# Patient Record
Sex: Male | Born: 1960 | Race: White | Hispanic: No | Marital: Single | Smoking: Never smoker
Health system: Southern US, Community
[De-identification: ages and names within clinical notes are randomized; demographics above are authoritative.]

## PROBLEM LIST (undated history)

## (undated) DIAGNOSIS — E785 Hyperlipidemia, unspecified: Secondary | ICD-10-CM

## (undated) DIAGNOSIS — I10 Essential (primary) hypertension: Secondary | ICD-10-CM

## (undated) DIAGNOSIS — T8859XA Other complications of anesthesia, initial encounter: Secondary | ICD-10-CM

## (undated) DIAGNOSIS — G4730 Sleep apnea, unspecified: Secondary | ICD-10-CM

## (undated) DIAGNOSIS — M1990 Unspecified osteoarthritis, unspecified site: Secondary | ICD-10-CM

## (undated) DIAGNOSIS — K219 Gastro-esophageal reflux disease without esophagitis: Secondary | ICD-10-CM

## (undated) HISTORY — PX: EYE SURGERY: SHX253

## (undated) HISTORY — PX: KNEE ARTHROSCOPY: SUR90

## (undated) HISTORY — PX: ROTATOR CUFF REPAIR: SHX139

## (undated) HISTORY — PX: KNEE ARTHROSCOPY: SHX127

---

## 2012-09-13 DIAGNOSIS — M47812 Spondylosis without myelopathy or radiculopathy, cervical region: Principal | ICD-10-CM | POA: Diagnosis present

## 2012-09-13 NOTE — H&P (Signed)
History of Present Illness The patient is a 52 year old male who presents today for follow up of their neck. The patient is being followed for their left-sided neck pain. Symptoms reported today include: pain (left lateral cervical pain with radiation into the left upper ext. to the level of the hand at times. He also has headaches, the pain starts posteriorly radiating into the top of the head. ), weakness (left upper ext. ) and numbness (left upper ext. ). The patient feels that they are doing poorly. Current treatment includes: physical therapy, activity modification and pain medications. The following medication has been used for pain control: Tylenol. The patient presents today following ESI (performed on [id number] per Dr. Ethelene Hal ) and physical therapy. The patient has reported symptom improvement with: physical therapy (minimal relief at this point ) while they have not gotten any relief of their symptoms with: Cortisone injections.   Subjective Transcription  He returns today for follow up. Unfortunately, the injections have not provided any significant relief. New MRI, which was done on 06-29-12, clearly demonstrates C4-5 pathology with left C5 nerve root compression due to uncovertebral joint spur formation. At this point, at C5-6, there is a small right posterior lateral disc protrusion but, again, all his symptoms are left sided, not right sided. Although there are some minor degenerative changes at 5-6 and 6-7, the bulk of his problem, I think was coming from 4-5 which does account for the left lateral deltoid and shoulder pain that he's having.   Allergies No Known Drug Allergies.    Social History Tobacco use. Never smoker. never smoker   Medication History Fish Oil Specific dose unknown - Active.   Vitals 08/16/2012 10:25 AM Weight: 195 lb Height: 69 in Body Surface Area: 2.08 m Body Mass Index: 28.8 kg/m Pulse: 59 (Regular) BP: 158/92 (Sitting,  Left Arm, Standard)  Plans Transcription  At this point, after a lengthy discussion, he would like to proceed with surgery. We talked about the disc replacement as well as fusion. The risks include infection, bleeding, nerve damage, death, stroke, paralysis, failure to heal, need for further surgery, ongoing or worse pain, loss of bowel or bladder control, throat pain, swallowing difficulties, hoarseness in the voice, need for posterior surgery, inability due to technical reasons to place the pro disc and needing to bail out to a fusion. We've gone over the surgical procedure itself. This would be an anterior incision with traction of the esophagus and trachea, removal of the C5 disc, and either implanting the plate and cage or the total disc replacement. At this point he is leaning towards doing a pro disc C, but I did tell him that, if, for technical reasons, it is not functioning correctly, I would bail out to a fusion. His MRI shows only very minor facet arthrosis. It's not significant enough that I would consider it contraindication to placing a total disc. No change to his current work status. At this point we will start the process. If there are any changes or issues, we will make adjustments.  Medical clearance obtained from Dr Shelby Mattocks, M. D

## 2012-09-26 ENCOUNTER — Encounter (HOSPITAL_COMMUNITY): Payer: Self-pay | Admitting: Pharmacy Technician

## 2012-09-29 ENCOUNTER — Encounter (HOSPITAL_COMMUNITY)
Admission: RE | Admit: 2012-09-29 | Discharge: 2012-09-29 | Disposition: A | Discharge status: Home / Self Care | Payer: Worker's Compensation | Source: Ambulatory Visit | Attending: Orthopedic Surgery | Admitting: Orthopedic Surgery

## 2012-09-29 ENCOUNTER — Encounter (HOSPITAL_COMMUNITY)
Admission: RE | Admit: 2012-09-29 | Discharge: 2012-09-29 | Disposition: A | Discharge status: Home / Self Care | Payer: Worker's Compensation | Source: Ambulatory Visit | Attending: Anesthesiology | Admitting: Anesthesiology

## 2012-09-29 ENCOUNTER — Encounter (HOSPITAL_COMMUNITY): Payer: Self-pay

## 2012-09-29 HISTORY — DX: Essential (primary) hypertension: I10

## 2012-09-29 LAB — CBC
HCT: 43.3 % (ref 39.0–52.0)
Hemoglobin: 15.7 g/dL (ref 13.0–17.0)
WBC: 8 10*3/uL (ref 4.0–10.5)

## 2012-09-29 LAB — SURGICAL PCR SCREEN: Staphylococcus aureus: NEGATIVE

## 2012-09-29 NOTE — Pre-Procedure Instructions (Signed)
Erik Cook  09/29/2012   Your procedure is scheduled on:  Wednesday, March 5th   Report to [facility] at  6:30 AM.  Call this number if you have problems the morning of surgery: [phone]   Remember:   Do not eat food or drink liquids after midnight Tuesday.   Take these medicines the morning of surgery with A SIP OF WATER: none   Do not wear jewelry.  Do not wear lotions, powders, or colognes. You may NOT wear deodorant.    Men may shave face and neck.   Do not bring valuables to the hospital.  Contacts, dentures or bridgework may not be worn into surgery.   Leave suitcase in the car. After surgery it may be brought to your room.  For patients admitted to the hospital, checkout time is 11:00 AM the day of discharge.   Patients discharged the day of surgery will not be allowed to drive home.   Name and phone number of your driver:    Special Instructions: Shower using CHG 2 nights before surgery and the night before surgery.  If you shower the day of surgery use CHG.  Use special wash - you have one bottle of CHG for all showers.  You should use approximately 1/3 of the bottle for each shower.   Please read over the following fact sheets that you were given: Pain Booklet, Coughing and Deep Breathing, MRSA Information and Surgical Site Infection Prevention

## 2012-10-04 MED ORDER — CEFAZOLIN SODIUM-DEXTROSE 2-3 GM-% IV SOLR
2.0000 g | INTRAVENOUS | Status: AC
  Administered 2012-10-05: 2 g via INTRAVENOUS
  Filled 2012-10-04: qty 50

## 2012-10-05 ENCOUNTER — Encounter (HOSPITAL_COMMUNITY): Payer: Self-pay | Admitting: *Deleted

## 2012-10-05 ENCOUNTER — Ambulatory Visit (HOSPITAL_COMMUNITY): Payer: Worker's Compensation

## 2012-10-05 ENCOUNTER — Observation Stay (HOSPITAL_COMMUNITY): Payer: Worker's Compensation

## 2012-10-05 ENCOUNTER — Ambulatory Visit (HOSPITAL_COMMUNITY): Payer: Worker's Compensation | Admitting: Anesthesiology

## 2012-10-05 ENCOUNTER — Observation Stay (HOSPITAL_COMMUNITY)
Admission: RE | Admit: 2012-10-05 | Discharge: 2012-10-05 | Disposition: A | Discharge status: Home / Self Care | Payer: Worker's Compensation | Source: Ambulatory Visit | Attending: Orthopedic Surgery | Admitting: Orthopedic Surgery

## 2012-10-05 ENCOUNTER — Encounter (HOSPITAL_COMMUNITY): Payer: Self-pay | Admitting: Anesthesiology

## 2012-10-05 ENCOUNTER — Encounter (HOSPITAL_COMMUNITY)
Admission: RE | Disposition: A | Discharge status: Home / Self Care | Payer: Self-pay | Source: Ambulatory Visit | Attending: Orthopedic Surgery

## 2012-10-05 DIAGNOSIS — Z01818 Encounter for other preprocedural examination: Secondary | ICD-10-CM | POA: Insufficient documentation

## 2012-10-05 DIAGNOSIS — Z01812 Encounter for preprocedural laboratory examination: Secondary | ICD-10-CM | POA: Insufficient documentation

## 2012-10-05 DIAGNOSIS — M47812 Spondylosis without myelopathy or radiculopathy, cervical region: Principal | ICD-10-CM | POA: Diagnosis present

## 2012-10-05 DIAGNOSIS — I10 Essential (primary) hypertension: Secondary | ICD-10-CM | POA: Insufficient documentation

## 2012-10-05 HISTORY — PX: ANTERIOR CERVICAL DECOMP/DISCECTOMY FUSION: SHX1161

## 2012-10-05 SURGERY — ANTERIOR CERVICAL DECOMPRESSION/DISCECTOMY FUSION 1 LEVEL
Anesthesia: General | Site: Spine Cervical | Wound class: Clean

## 2012-10-05 MED ORDER — ONDANSETRON HCL 4 MG/2ML IJ SOLN: 4.0000 mg | INTRAMUSCULAR | Status: DC | PRN

## 2012-10-05 MED ORDER — MORPHINE SULFATE 2 MG/ML IJ SOLN: 1.0000 mg | INTRAMUSCULAR | Status: DC | PRN

## 2012-10-05 MED ORDER — OXYCODONE-ACETAMINOPHEN 10-325 MG PO TABS: 1.0000 | ORAL_TABLET | ORAL | Status: DC | PRN

## 2012-10-05 MED ORDER — POLYETHYLENE GLYCOL 3350 17 GM/SCOOP PO POWD: 17.0000 g | Freq: Every day | ORAL | Status: DC

## 2012-10-05 MED ORDER — EPHEDRINE SULFATE 50 MG/ML IJ SOLN
INTRAMUSCULAR | Status: DC | PRN
  Administered 2012-10-05: 5 mg via INTRAVENOUS

## 2012-10-05 MED ORDER — OXYCODONE HCL 5 MG PO TABS
10.0000 mg | ORAL_TABLET | ORAL | Status: DC | PRN
  Administered 2012-10-05: 10 mg via ORAL

## 2012-10-05 MED ORDER — MENTHOL 3 MG MT LOZG
1.0000 | LOZENGE | OROMUCOSAL | Status: DC | PRN
  Administered 2012-10-05: 3 mg via ORAL
  Filled 2012-10-05: qty 9

## 2012-10-05 MED ORDER — MEPERIDINE HCL 25 MG/ML IJ SOLN: 6.2500 mg | INTRAMUSCULAR | Status: DC | PRN

## 2012-10-05 MED ORDER — HYDROMORPHONE HCL PF 1 MG/ML IJ SOLN
0.2500 mg | INTRAMUSCULAR | Status: DC | PRN
  Administered 2012-10-05 (×4): 0.5 mg via INTRAVENOUS

## 2012-10-05 MED ORDER — PROPOFOL 10 MG/ML IV BOLUS
INTRAVENOUS | Status: DC | PRN
  Administered 2012-10-05: 120 mg via INTRAVENOUS

## 2012-10-05 MED ORDER — METHOCARBAMOL 100 MG/ML IJ SOLN: 500.0000 mg | Freq: Four times a day (QID) | INTRAVENOUS | Status: DC | PRN

## 2012-10-05 MED ORDER — ONDANSETRON HCL 4 MG/2ML IJ SOLN
4.0000 mg | Freq: Once | INTRAMUSCULAR | Status: AC | PRN
  Administered 2012-10-05: 4 mg via INTRAVENOUS

## 2012-10-05 MED ORDER — SODIUM CHLORIDE 0.9 % IV SOLN: 250.0000 mL | INTRAVENOUS | Status: DC

## 2012-10-05 MED ORDER — ARTIFICIAL TEARS OP OINT
TOPICAL_OINTMENT | OPHTHALMIC | Status: DC | PRN
  Administered 2012-10-05: 1 via OPHTHALMIC

## 2012-10-05 MED ORDER — PROMETHAZINE HCL 25 MG/ML IJ SOLN
6.2500 mg | Freq: Once | INTRAMUSCULAR | Status: AC
  Administered 2012-10-05: 6.25 mg via INTRAVENOUS

## 2012-10-05 MED ORDER — ACETAMINOPHEN 10 MG/ML IV SOLN
1000.0000 mg | Freq: Four times a day (QID) | INTRAVENOUS | Status: DC
  Filled 2012-10-05 (×3): qty 100

## 2012-10-05 MED ORDER — HYDROMORPHONE HCL PF 1 MG/ML IJ SOLN
INTRAMUSCULAR | Status: AC
  Filled 2012-10-05: qty 1

## 2012-10-05 MED ORDER — DEXAMETHASONE 4 MG PO TABS
4.0000 mg | ORAL_TABLET | Freq: Four times a day (QID) | ORAL | Status: DC
  Filled 2012-10-05 (×3): qty 1

## 2012-10-05 MED ORDER — FENTANYL CITRATE 0.05 MG/ML IJ SOLN
INTRAMUSCULAR | Status: DC | PRN
  Administered 2012-10-05 (×2): 50 ug via INTRAVENOUS
  Administered 2012-10-05: 100 ug via INTRAVENOUS

## 2012-10-05 MED ORDER — ACETAMINOPHEN 10 MG/ML IV SOLN
INTRAVENOUS | Status: AC
  Filled 2012-10-05: qty 100

## 2012-10-05 MED ORDER — OXYCODONE HCL 5 MG PO TABS
ORAL_TABLET | ORAL | Status: AC
  Filled 2012-10-05: qty 2

## 2012-10-05 MED ORDER — LIDOCAINE HCL (CARDIAC) 20 MG/ML IV SOLN
INTRAVENOUS | Status: DC | PRN
  Administered 2012-10-05: 100 mg via INTRAVENOUS

## 2012-10-05 MED ORDER — PROMETHAZINE HCL 25 MG/ML IJ SOLN
INTRAMUSCULAR | Status: AC
  Filled 2012-10-05: qty 1

## 2012-10-05 MED ORDER — DEXAMETHASONE SODIUM PHOSPHATE 4 MG/ML IJ SOLN
4.0000 mg | Freq: Four times a day (QID) | INTRAMUSCULAR | Status: DC
  Filled 2012-10-05 (×3): qty 1

## 2012-10-05 MED ORDER — ZOLPIDEM TARTRATE 5 MG PO TABS: 5.0000 mg | ORAL_TABLET | Freq: Every evening | ORAL | Status: DC | PRN

## 2012-10-05 MED ORDER — OXYCODONE HCL 5 MG PO TABS: 5.0000 mg | ORAL_TABLET | Freq: Once | ORAL | Status: DC | PRN

## 2012-10-05 MED ORDER — ONDANSETRON HCL 4 MG PO TABS: 4.0000 mg | ORAL_TABLET | Freq: Three times a day (TID) | ORAL | Status: DC | PRN

## 2012-10-05 MED ORDER — METHOCARBAMOL 500 MG PO TABS
ORAL_TABLET | ORAL | Status: AC
  Filled 2012-10-05: qty 1

## 2012-10-05 MED ORDER — DEXAMETHASONE SODIUM PHOSPHATE 10 MG/ML IJ SOLN: 4.0000 mg | Freq: Once | INTRAMUSCULAR | Status: DC

## 2012-10-05 MED ORDER — LACTATED RINGERS IV SOLN: INTRAVENOUS | Status: DC

## 2012-10-05 MED ORDER — BUPIVACAINE-EPINEPHRINE 0.25% -1:200000 IJ SOLN
INTRAMUSCULAR | Status: DC | PRN
  Administered 2012-10-05: 2 mL

## 2012-10-05 MED ORDER — METHOCARBAMOL 500 MG PO TABS: 500.0000 mg | ORAL_TABLET | Freq: Three times a day (TID) | ORAL | Status: DC | PRN

## 2012-10-05 MED ORDER — SODIUM CHLORIDE 0.9 % IJ SOLN: 3.0000 mL | Freq: Two times a day (BID) | INTRAMUSCULAR | Status: DC

## 2012-10-05 MED ORDER — DEXAMETHASONE SODIUM PHOSPHATE 10 MG/ML IJ SOLN
INTRAMUSCULAR | Status: AC
  Administered 2012-10-05: 10 mg via INTRAVENOUS
  Filled 2012-10-05: qty 1

## 2012-10-05 MED ORDER — NEOSTIGMINE METHYLSULFATE 1 MG/ML IJ SOLN
INTRAMUSCULAR | Status: DC | PRN
  Administered 2012-10-05: 3 mg via INTRAVENOUS

## 2012-10-05 MED ORDER — GLYCOPYRROLATE 0.2 MG/ML IJ SOLN
INTRAMUSCULAR | Status: DC | PRN
  Administered 2012-10-05: 0.4 mg via INTRAVENOUS

## 2012-10-05 MED ORDER — CEFAZOLIN SODIUM 1-5 GM-% IV SOLN
1.0000 g | Freq: Three times a day (TID) | INTRAVENOUS | Status: DC
  Administered 2012-10-05: 1 g via INTRAVENOUS
  Filled 2012-10-05 (×2): qty 50

## 2012-10-05 MED ORDER — METHOCARBAMOL 500 MG PO TABS
500.0000 mg | ORAL_TABLET | Freq: Four times a day (QID) | ORAL | Status: DC | PRN
  Administered 2012-10-05: 500 mg via ORAL

## 2012-10-05 MED ORDER — LIDOCAINE HCL 4 % MT SOLN
OROMUCOSAL | Status: DC | PRN
  Administered 2012-10-05: 4 mL via TOPICAL

## 2012-10-05 MED ORDER — ONDANSETRON HCL 4 MG/2ML IJ SOLN
INTRAMUSCULAR | Status: DC | PRN
  Administered 2012-10-05: 4 mg via INTRAVENOUS

## 2012-10-05 MED ORDER — DOCUSATE SODIUM 100 MG PO CAPS: 100.0000 mg | ORAL_CAPSULE | Freq: Three times a day (TID) | ORAL | Status: DC | PRN

## 2012-10-05 MED ORDER — 0.9 % SODIUM CHLORIDE (POUR BTL) OPTIME
TOPICAL | Status: DC | PRN
  Administered 2012-10-05: 1000 mL

## 2012-10-05 MED ORDER — THROMBIN 20000 UNITS EX SOLR
CUTANEOUS | Status: DC | PRN
  Administered 2012-10-05: 09:00:00 via TOPICAL

## 2012-10-05 MED ORDER — BUPIVACAINE-EPINEPHRINE 0.25% -1:200000 IJ SOLN
INTRAMUSCULAR | Status: AC
  Filled 2012-10-05: qty 1

## 2012-10-05 MED ORDER — OXYCODONE HCL 5 MG/5ML PO SOLN: 5.0000 mg | Freq: Once | ORAL | Status: DC | PRN

## 2012-10-05 MED ORDER — SODIUM CHLORIDE 0.9 % IJ SOLN: 3.0000 mL | INTRAMUSCULAR | Status: DC | PRN

## 2012-10-05 MED ORDER — HYDROCHLOROTHIAZIDE 25 MG PO TABS: 25.0000 mg | ORAL_TABLET | Freq: Every day | ORAL | Status: DC

## 2012-10-05 MED ORDER — THROMBIN 20000 UNITS EX SOLR
CUTANEOUS | Status: AC
  Filled 2012-10-05: qty 20000

## 2012-10-05 MED ORDER — ACETAMINOPHEN 10 MG/ML IV SOLN
1000.0000 mg | Freq: Once | INTRAVENOUS | Status: AC
  Administered 2012-10-05: 1000 mg via INTRAVENOUS
  Filled 2012-10-05: qty 100

## 2012-10-05 MED ORDER — MIDAZOLAM HCL 5 MG/5ML IJ SOLN
INTRAMUSCULAR | Status: DC | PRN
  Administered 2012-10-05: 2 mg via INTRAVENOUS

## 2012-10-05 MED ORDER — LACTATED RINGERS IV SOLN
INTRAVENOUS | Status: DC | PRN
  Administered 2012-10-05 (×2): via INTRAVENOUS

## 2012-10-05 MED ORDER — PHENOL 1.4 % MT LIQD
1.0000 | OROMUCOSAL | Status: DC | PRN
  Filled 2012-10-05: qty 177

## 2012-10-05 MED ORDER — ONDANSETRON HCL 4 MG/2ML IJ SOLN
INTRAMUSCULAR | Status: AC
  Filled 2012-10-05: qty 2

## 2012-10-05 MED ORDER — MENTHOL 3 MG MT LOZG: 1.0000 | LOZENGE | OROMUCOSAL | Status: DC | PRN

## 2012-10-05 MED ORDER — ROCURONIUM BROMIDE 100 MG/10ML IV SOLN
INTRAVENOUS | Status: DC | PRN
  Administered 2012-10-05: 50 mg via INTRAVENOUS

## 2012-10-05 SURGICAL SUPPLY — 52 items
BLADE SURG ROTATE 9660 (MISCELLANEOUS) IMPLANT
BUR EGG ELITE 4.0 (BURR) IMPLANT
BUR MATCHSTICK NEURO 3.0 LAGG (BURR) IMPLANT
CANISTER SUCTION 2500CC (MISCELLANEOUS) ×2 IMPLANT
CLOTH BEACON ORANGE TIMEOUT ST (SAFETY) ×2 IMPLANT
CLSR STERI-STRIP ANTIMIC 1/2X4 (GAUZE/BANDAGES/DRESSINGS) ×2 IMPLANT
COLLAR CERV LO CONTOUR FIRM DE (SOFTGOODS) ×2 IMPLANT
CORDS BIPOLAR (ELECTRODE) ×2 IMPLANT
COVER SURGICAL LIGHT HANDLE (MISCELLANEOUS) ×4 IMPLANT
CRADLE DONUT ADULT HEAD (MISCELLANEOUS) ×2 IMPLANT
DRAPE C-ARM 42X72 X-RAY (DRAPES) ×2 IMPLANT
DRAPE POUCH INSTRU U-SHP 10X18 (DRAPES) ×2 IMPLANT
DRAPE SURG 17X23 STRL (DRAPES) ×2 IMPLANT
DRAPE U-SHAPE 47X51 STRL (DRAPES) ×2 IMPLANT
DRSG MEPILEX BORDER 4X4 (GAUZE/BANDAGES/DRESSINGS) ×2 IMPLANT
DURAPREP 26ML APPLICATOR (WOUND CARE) ×2 IMPLANT
ELECT COATED BLADE 2.86 ST (ELECTRODE) ×2 IMPLANT
ELECT REM PT RETURN 9FT ADLT (ELECTROSURGICAL) ×2
ELECTRODE REM PT RTRN 9FT ADLT (ELECTROSURGICAL) ×1 IMPLANT
GLOVE BIOGEL PI IND STRL 8.5 (GLOVE) ×1 IMPLANT
GLOVE BIOGEL PI INDICATOR 8.5 (GLOVE) ×1
GLOVE ECLIPSE 8.5 STRL (GLOVE) ×2 IMPLANT
GOWN PREVENTION PLUS XXLARGE (GOWN DISPOSABLE) ×2 IMPLANT
GOWN STRL REIN XL XLG (GOWN DISPOSABLE) ×4 IMPLANT
KIT BASIN OR (CUSTOM PROCEDURE TRAY) ×2 IMPLANT
KIT ROOM TURNOVER OR (KITS) ×2 IMPLANT
NEEDLE SPNL 18GX3.5 QUINCKE PK (NEEDLE) ×2 IMPLANT
NS IRRIG 1000ML POUR BTL (IV SOLUTION) ×2 IMPLANT
PACK ORTHO CERVICAL (CUSTOM PROCEDURE TRAY) ×2 IMPLANT
PACK UNIVERSAL I (CUSTOM PROCEDURE TRAY) ×2 IMPLANT
PAD ARMBOARD 7.5X6 YLW CONV (MISCELLANEOUS) ×4 IMPLANT
PATTIES SURGICAL .25X.25 (GAUZE/BANDAGES/DRESSINGS) IMPLANT
PIN RETAINER PRODISC 14 MM (PIN) ×4 IMPLANT
PLATE ONE LEVEL SKYLINE 14MM (Plate) ×2 IMPLANT
PUTTY BONE DBX 2.5 MIS (Bone Implant) ×2 IMPLANT
RESTRAINT LIMB HOLDER UNIV (RESTRAINTS) ×2 IMPLANT
SCREW SKYLINE 14MM SD-VA (Screw) ×4 IMPLANT
SPONGE INTESTINAL PEANUT (DISPOSABLE) ×2 IMPLANT
SPONGE SURGIFOAM ABS GEL 100 (HEMOSTASIS) ×2 IMPLANT
SURGIFLO TRUKIT (HEMOSTASIS) IMPLANT
SUT MNCRL AB 3-0 PS2 18 (SUTURE) ×2 IMPLANT
SUT SILK 2 0 (SUTURE)
SUT SILK 2-0 18XBRD TIE 12 (SUTURE) IMPLANT
SUT VIC AB 2-0 CT1 18 (SUTURE) ×2 IMPLANT
SYR BULB IRRIGATION 50ML (SYRINGE) ×2 IMPLANT
SYR CONTROL 10ML LL (SYRINGE) ×2 IMPLANT
Skyline 16mm SD-VA screw (Spine Construct) ×4 IMPLANT
TAPE CLOTH 4X10 WHT NS (GAUZE/BANDAGES/DRESSINGS) ×2 IMPLANT
TAPE UMBILICAL COTTON 1/8X30 (MISCELLANEOUS) ×2 IMPLANT
TOWEL OR 17X24 6PK STRL BLUE (TOWEL DISPOSABLE) ×2 IMPLANT
TOWEL OR 17X26 10 PK STRL BLUE (TOWEL DISPOSABLE) ×2 IMPLANT
WATER STERILE IRR 1000ML POUR (IV SOLUTION) ×2 IMPLANT

## 2012-10-05 NOTE — Anesthesia Postprocedure Evaluation (Signed)
Anesthesia Post Note  Patient: Erik Cook  Procedure(s) Performed: Procedure(s) (LRB): ANTERIOR CERVICAL DECOMPRESSION/DISCECTOMY FUSION 1 LEVEL (N/A)  Anesthesia type: general  Patient location: PACU  Post pain: Pain level controlled  Post assessment: Patient's Cardiovascular Status Stable  Last Vitals:  Filed Vitals:   10/05/12 1200  BP: 152/76  Pulse: 52  Temp:   Resp: 13    Post vital signs: Reviewed and stable  Level of consciousness: sedated  Complications: No apparent anesthesia complications

## 2012-10-05 NOTE — Transfer of Care (Signed)
Immediate Anesthesia Transfer of Care Note  Patient: Erik Cook  Procedure(s) Performed: Procedure(s) with comments: ANTERIOR CERVICAL DECOMPRESSION/DISCECTOMY FUSION 1 LEVEL (N/A) - TOTAL DISC REPLACEMENT VERSES ACDF C4-5  Patient Location: PACU  Anesthesia Type:General  Level of Consciousness: oriented, sedated and patient cooperative  Airway & Oxygen Therapy: Patient Spontanous Breathing and Patient connected to nasal cannula oxygen  Post-op Assessment: Report given to PACU RN and Post -op Vital signs reviewed and stable  Post vital signs: Reviewed and stable  Complications: No apparent anesthesia complications

## 2012-10-05 NOTE — H&P (Signed)
No change in clinical exam H+P reviewed  

## 2012-10-05 NOTE — Preoperative (Signed)
Beta Blockers   Reason not to administer Beta Blockers:Not Applicable 

## 2012-10-05 NOTE — Brief Op Note (Signed)
10/05/2012  10:49 AM  PATIENT:  Bernerd Limbo  52 y.o. male  PRE-OPERATIVE DIAGNOSIS:  C4-5 DISCARNATION  POST-OPERATIVE DIAGNOSIS:  C4-5 DISCARNATION  PROCEDURE:  Procedure(s) with comments: ANTERIOR CERVICAL DECOMPRESSION/DISCECTOMY FUSION 1 LEVEL (N/A) - TOTAL DISC REPLACEMENT VERSES ACDF C4-5  SURGEON:  Surgeon(s) and Role:    * Venita Lick, MD - Primary  PHYSICIAN ASSISTANT:   ASSISTANTS: none   ANESTHESIA:   general  EBL:  Total I/O In: 1200 [I.V.:1200] Out: 30 [Blood:30]  BLOOD ADMINISTERED:none  DRAINS: none   LOCAL MEDICATIONS USED:  MARCAINE     SPECIMEN:  No Specimen  DISPOSITION OF SPECIMEN:  N/A  COUNTS:  YES  TOURNIQUET:  * No tourniquets in log *  DICTATION: .Other Dictation: Dictation Number [id number]  PLAN OF CARE: Admit for overnight observation  PATIENT DISPOSITION:  PACU - hemodynamically stable.

## 2012-10-05 NOTE — Op Note (Signed)
NAMEMarland Kitchen  ARYE, WEYENBERG NO.:  [id number]  MEDICAL RECORD NO.:  [id number]  LOCATION:  [id number]                        FACILITY:  [facility]  PHYSICIAN:  Alvy Beal, MD    DATE OF BIRTH:  Dec 07, 1960  DATE OF PROCEDURE:  10/05/2012 DATE OF DISCHARGE:  10/05/2012                              OPERATIVE REPORT   PREOPERATIVE DIAGNOSIS:  Cervical spondylitic radiculopathy, C4-5.  POSTOPERATIVE DIAGNOSIS:  Cervical spondylitic radiculopathy, C4-5.  OPERATION:  Intended operation was the cervical total disk replacement versus ACDF at C4-5.  Actual procedure was anterior cervical diskectomy and fusion at C4-5.  INTRAOPERATIVE FINDINGS:  Once the diskectomy was completed, there was significant downsloping of the superior endplate of C5, which precluded the implantation of the ProDisc.  As a result of the structural deformity in the vertebral body, I elected to proceed with the fusion.  COMPLICATIONS:  None.  CONDITION:  Stable.  Instrumentation system used was a 7 small Titan Titanium intervertebral cage, packed with DBX Mix with a size 14 DePuy Skyline plate with 16-XW locking screws into the body of C4, 14-mm screws into the body of C5.  HISTORY:  This is a very pleasant gentleman who presents with a status post work-related injury with radicular left arm pain and neck pain and the C5 dermatome.  Attempts at conservative management have failed to alleviate his symptoms.  As a result of the ongoing severe pain, we elected to proceed with surgery.  Because the facet joint did appear exact, I did feel as though the disk replacement may be a viable option. I did tell him that with any proposed disk replacement if there were structural abnormalities, which precluded implantation of the disk replacement that we would be allowed to a fusion.  All important risks, benefits and alternatives to surgery were discussed and the patient consented to the aforementioned  procedure.  OPERATIVE NOTE:  The patient was brought to the operating room, placed supine on the operating table.  After successful induction of general anesthesia and endotracheal intubation, TEDs and SCDs were applied. Rolled towels were placed between the shoulder blades.  The anterior cervical spine was prepped and draped in a standard fashion.  The time-out was done confirming patient, procedure, and all pertinent important data.  I infiltrated the proposed midline incision site with 0.25% Marcaine. Once the time-out was completed, I then made a midline incision over the C4-5 disk space.  Sharp dissection was carried out down to and through the deep fascia.  The platysma was sharply incised and then I bluntly dissected along the medial border of the sternocleidomastoid until I could palpate the carotid sheath.  I then swept the esophagus and trachea to the right and protected the carotid sheath laterally with a finger.  I then palpated the anterior cervical spine of the anterior longitudinal ligament.  I then placed a needle into the C4-5 disk space. I took an x-ray and confirmed that I was at the appropriate level.  I then removed the anterior traction spurs and then mobilized the longus coli muscle using bipolar electrocautery.  I then placed self-retaining retractor blades underneath the longus coli muscle, deflated the cuff, expanded the  retractors and then reinflated the endotracheal cuff.  At this point, I had good visualization of the anterior spine.  I then incised the annulus and used the combination of pituitary rongeurs, curettes, and Kerrison rongeurs to remove the bulk of the disk material. I then used a 2 and 3-mm Kerrison to resect the overhanging traction spur from the inferior aspect of the C4 vertebral body.  At this point, I had good visualization of the disk space and I could see the true endplates.  I then placed distraction pins parallel to the endplates  in the midline.  I used x-ray in the AP plane to confirm midline.  I then distracted gently the intervertebral space and maintained it with the distraction pins.  I then continued my diskectomy all the way down to the posterior longitudinal ligament.  This was done with pituitary rongeurs and curettes.  I then used a nerve hook to develop a plane underneath the posterior longitudinal ligament and then used a 1-mm Kerrison to resect the posterior longitudinal ligament.  Since the left side was more symptomatic, I did undercut the osteophytes at this level more.  With the diskectomy completed and the decompression completed, I then took lateral films.  At this point, once I had an adequate diskectomy, I could now appreciate a downward depression of the superior endplate of C5.  Because of this altered architecture, I was reluctant to place the cervical disk replacement.  My concern was it would not be stable.  As a result, I elected to do the fusion.  I rasped the endplates, irrigated it copiously with normal saline and then measured with trial devices.  The 7 small spacer was placed and had good position.  I then contoured a 14-mm anterior plate and removed all of the retractors and then secured the device in place and then I locked the screws to the plate in the appropriate fashion as prescribed by the manufacturer.  I irrigated it copiously with normal saline and then checked to make sure hemostasis.  I then checked to ensure the esophagus and trachea were not entrapped beneath the plate, then I returned them to the midline.  I then closed the platysma with interrupted #2-0 Vicryl sutures and the skin with 3-0 Monocryl.  Steri-Strips, soft collar was applied.  X-rays were taken, which were satisfactory for hardware position.  The patient was extubated and transferred to the PACU without incident. At the end of the case, all needle and sponge counts were correct.     Alvy Beal, MD     DDB/MEDQ  D:  10/05/2012  T:  10/05/2012  Job:  [id number]

## 2012-10-05 NOTE — Plan of Care (Signed)
Problem: Consults Goal: Diagnosis - Spinal Surgery Cervical Spine Fusion C4-C5

## 2012-10-05 NOTE — Anesthesia Preprocedure Evaluation (Addendum)
Anesthesia Evaluation  Patient identified by MRN, date of birth, ID band Patient awake    Reviewed: Allergy & Precautions, H&P , NPO status , Patient's Chart, lab work & pertinent test results  History of Anesthesia Complications Negative for: history of anesthetic complications  Airway Mallampati: I TM Distance: >3 FB Neck ROM: Full    Dental  (+) Teeth Intact and Dental Advisory Given   Pulmonary neg pulmonary ROS,          Cardiovascular hypertension, Pt. on medications Rhythm:Regular Rate:Normal     Neuro/Psych negative neurological ROS     GI/Hepatic negative GI ROS, Neg liver ROS,   Endo/Other  negative endocrine ROS  Renal/GU negative Renal ROS  negative genitourinary   Musculoskeletal   Abdominal   Peds  Hematology negative hematology ROS (+)   Anesthesia Other Findings   Reproductive/Obstetrics negative OB ROS                          Anesthesia Physical Anesthesia Plan  ASA: II  Anesthesia Plan: General   Post-op Pain Management:    Induction: Intravenous  Airway Management Planned: Oral ETT  Additional Equipment:   Intra-op Plan:   Post-operative Plan: Extubation in OR  Informed Consent: I have reviewed the patients History and Physical, chart, labs and discussed the procedure including the risks, benefits and alternatives for the proposed anesthesia with the patient or authorized representative who has indicated his/her understanding and acceptance.     Plan Discussed with: CRNA and Surgeon  Anesthesia Plan Comments:         Anesthesia Quick Evaluation

## 2012-10-05 NOTE — Progress Notes (Signed)
CARE MANAGEMENT NOTE 10/05/2012  Patient:  Erik Cook, Erik Cook   Account Number:  [id number]  Date Initiated:  10/05/2012  Documentation initiated by:  Vance Peper  Subjective/Objective Assessment:   52 yr old male s/p Cervical spine fusion C4-C5     Action/Plan:   no home health needs identified.   Anticipated DC Date:  10/06/2012   Anticipated DC Plan:  HOME/SELF CARE      DC Planning Services  CM consult      Choice offered to / List presented to:             Status of service:  Completed, signed off Medicare Important Message given?   (If response is "NO", the following Medicare IM given date fields will be blank) Date Medicare IM given:   Date Additional Medicare IM given:    Discharge Disposition:  HOME/SELF CARE  Per UR Regulation:    If discussed at Long Length of Stay Meetings, dates discussed:    Comments:

## 2012-10-05 NOTE — Anesthesia Procedure Notes (Signed)
Procedure Name: Intubation Date/Time: 10/05/2012 8:38 AM Performed by: Lovie Chol Pre-anesthesia Checklist: Patient identified, Emergency Drugs available, Suction available, Patient being monitored and Timeout performed Patient Re-evaluated:Patient Re-evaluated prior to inductionOxygen Delivery Method: Circle system utilized Preoxygenation: Pre-oxygenation with 100% oxygen Intubation Type: IV induction Ventilation: Mask ventilation without difficulty and Oral airway inserted - appropriate to patient size Laryngoscope Size: Miller and 2 Grade View: Grade II Tube type: Oral Tube size: 7.5 mm Number of attempts: 1 Airway Equipment and Method: Stylet and LTA kit utilized Placement Confirmation: ETT inserted through vocal cords under direct vision,  positive ETCO2,  CO2 detector and breath sounds checked- equal and bilateral Secured at: 22 cm Tube secured with: Tape Dental Injury: Teeth and Oropharynx as per pre-operative assessment  Comments: Soft bite block utilized.

## 2012-10-05 NOTE — Progress Notes (Signed)
Patient provided with discharge instructions and follow up information. He is aware of signs and symptoms of infection and when to contact the MD. HE has no further questions at this time and is going home with his significant other.

## 2012-10-05 NOTE — Discharge Summary (Signed)
Patient ID: Erik Cook MRN: [id number] DOB/AGE: 1961-03-06 52 y.o.  Admit date: 10/05/2012 Discharge date: 10/05/2012  Admission Diagnoses:  Active Problems:   * No active hospital problems. *   Discharge Diagnoses:  Active Problems:   * No active hospital problems. *  status post Procedure(s): ANTERIOR CERVICAL DECOMPRESSION/DISCECTOMY FUSION 1 LEVEL  Past Medical History  Diagnosis Date  . Hypertension     Surgeries: Procedure(s): ANTERIOR CERVICAL DECOMPRESSION/DISCECTOMY FUSION 1 LEVEL on 10/05/2012   Consultants:  stable  Discharged Condition: Improved  Hospital Course: Erik Cook is an 52 y.o. male who was admitted 10/05/2012 for operative treatment of Cervical spondylosis without myelopathy. Patient failed conservative treatments (please see the history and physical for the specifics) and had severe unremitting pain that affects sleep, daily activities and work/hobbies. After pre-op clearance, the patient was taken to the operating room on 10/05/2012 and underwent  Procedure(s): ANTERIOR CERVICAL DECOMPRESSION/DISCECTOMY FUSION 1 LEVEL.    Patient was given perioperative antibiotics: Anti-infectives   Start     Dose/Rate Route Frequency Ordered Stop   10/05/12 1630  ceFAZolin (ANCEF) IVPB 1 g/50 mL premix     1 g 100 mL/hr over 30 Minutes Intravenous Every 8 hours 10/05/12 1419 10/06/12 0829   10/04/12 1418  ceFAZolin (ANCEF) IVPB 2 g/50 mL premix     2 g 100 mL/hr over 30 Minutes Intravenous 30 min pre-op 10/04/12 1418 10/05/12 0830       Patient was given sequential compression devices and early ambulation to prevent DVT.   Patient benefited maximally from hospital stay and there were no complications. At the time of discharge, the patient was urinating/moving their bowels without difficulty, tolerating a regular diet, pain is controlled with oral pain medications and they have been cleared by PT/OT.   Recent vital signs: Patient Vitals for the past 24  hrs:  BP Temp Temp src Pulse Resp SpO2  10/05/12 1423 148/86 mmHg 97.6 F (36.4 C) - 75 18 100 %  10/05/12 1345 148/88 mmHg - - 71 12 99 %  10/05/12 1330 138/75 mmHg 98.2 F (36.8 C) - 94 13 100 %  10/05/12 1315 132/85 mmHg - - 86 17 98 %  10/05/12 1300 146/80 mmHg - - 69 11 100 %  10/05/12 1245 147/78 mmHg - - 77 12 99 %  10/05/12 1230 142/73 mmHg - - 60 17 100 %  10/05/12 1215 153/86 mmHg - - 104 23 100 %  10/05/12 1200 152/76 mmHg - - 52 13 99 %  10/05/12 1145 144/78 mmHg - - 50 6 100 %  10/05/12 1130 151/88 mmHg - - 64 13 100 %  10/05/12 1115 137/79 mmHg - - 64 16 100 %  10/05/12 1100 131/83 mmHg 97.9 F (36.6 C) - 79 16 98 %  10/05/12 0706 138/93 mmHg 97 F (36.1 C) Oral 80 20 98 %     Recent laboratory studies: No results found for this basename: WBC, HGB, HCT, PLT, NA, K, CL, CO2, BUN, CREATININE, GLUCOSE, PT, INR, CALCIUM, 2,  in the last 72 hours   Discharge Medications:     Medication List    TAKE these medications       docusate sodium 100 MG capsule  Commonly known as:  COLACE  Take 1 capsule (100 mg total) by mouth 3 (three) times daily as needed for constipation.     fish oil-omega-3 fatty acids 1000 MG capsule  Take 1 g by mouth daily.  hydrochlorothiazide 25 MG tablet  Commonly known as:  HYDRODIURIL  Take 25 mg by mouth daily.     methocarbamol 500 MG tablet  Commonly known as:  ROBAXIN  Take 1 tablet (500 mg total) by mouth 3 (three) times daily as needed.     multivitamin with minerals Tabs  Take 1 tablet by mouth daily.     niacin 50 MG tablet  Take 50 mg by mouth daily with breakfast.     ondansetron 4 MG tablet  Commonly known as:  ZOFRAN  Take 1 tablet (4 mg total) by mouth every 8 (eight) hours as needed for nausea.     oxyCODONE-acetaminophen 10-325 MG per tablet  Commonly known as:  PERCOCET  Take 1 tablet by mouth every 4 (four) hours as needed for pain.     polyethylene glycol powder powder  Commonly known as:  GLYCOLAX   Take 17 g by mouth daily.     TRIPLE FLEX 50+ Tabs  Take 1 tablet by mouth daily.     VITAMIN B-6 PO  Take 1 tablet by mouth daily.        Diagnostic Studies: Dg Chest 2 View  09/29/2012  *RADIOLOGY REPORT*  Clinical Data: History of hypertension  CHEST - 2 VIEW  Comparison: None.  Findings: Cardiomediastinal silhouette is unremarkable.  No acute infiltrate or pleural effusion.  No pulmonary edema.  Mild degenerative changes thoracic spine.  IMPRESSION: No active disease.  Mild degenerative changes thoracic spine.   Original Report Authenticated By: Natasha Mead, M.D.    Dg Cervical Spine 2 Or 3 Views  10/05/2012  *RADIOLOGY REPORT*  Clinical Data: Postop fusion  CERVICAL SPINE - 2-3 VIEW  Comparison: None.  Findings: The cervical spine is visualized to C6-7 on the lateral view.  Status post ACDF C4-5.  Visualized lung apices are clear.  IMPRESSION: Status post C4-5 ACDF.   Original Report Authenticated By: Charline Bills, M.D.    Dg Cervical Spine 2-3 Views  10/05/2012  *RADIOLOGY REPORT*  Clinical Data: ACDF C4-5.  CERVICAL SPINE - 2-3 VIEW  Comparison: 09/29/2012  Findings: Three intraoperative spot images are submitted.  These demonstrate changes of ACDF at C4-5.  Normal alignment.  No hardware or bony complicating feature.  IMPRESSION: ACDF C4-5.   Original Report Authenticated By: Charlett Nose, M.D.    Dg Cervical Spine 2 Or 3 Views  09/29/2012  *RADIOLOGY REPORT*  Clinical Data: ACDF at C4-C5.  CERVICAL SPINE - 2-3 VIEW  Comparison: No priors.  Findings: Two views of the cervical spine demonstrate multilevel degenerative disc disease, most severe at C4-C5.  Mild multilevel facet arthropathy is noted.  These degenerative changes result in some mild reversal of normal cervical lordosis centered at the level of C4.  Prevertebral soft tissues are normal.  IMPRESSION: 1.  Multilevel degenerative disc disease and facet arthropathy, most severe at C4-C5, as above.   Original Report Authenticated  By: Trudie Reed, M.D.    Dg C-arm [phone] Min  10/05/2012    Original Report Authenticated By: Charlett Nose, M.D.           Follow-up Information   Follow up with Alvy Beal, MD In 2 weeks. (As needed if symptoms worsen)    Contact information:   [address] [place] [address] [phone]       Discharge Plan:  discharge to home  Disposition: ok to d/c to home Stable doing well xrays satisfactory  Signed: Venita Lick D for Dr. Venita Lick [facility] [phone] 10/05/2012, 5:03 PM

## 2012-10-06 ENCOUNTER — Encounter (HOSPITAL_COMMUNITY): Payer: Self-pay | Admitting: Orthopedic Surgery

## 2016-09-28 DIAGNOSIS — M5416 Radiculopathy, lumbar region: Secondary | ICD-10-CM | POA: Insufficient documentation

## 2016-12-01 ENCOUNTER — Ambulatory Visit (INDEPENDENT_AMBULATORY_CARE_PROVIDER_SITE_OTHER): Payer: Medicaid Other

## 2016-12-01 ENCOUNTER — Telehealth (INDEPENDENT_AMBULATORY_CARE_PROVIDER_SITE_OTHER): Payer: Self-pay | Admitting: Specialist

## 2016-12-01 ENCOUNTER — Encounter (INDEPENDENT_AMBULATORY_CARE_PROVIDER_SITE_OTHER): Payer: Self-pay | Admitting: Specialist

## 2016-12-01 ENCOUNTER — Ambulatory Visit (INDEPENDENT_AMBULATORY_CARE_PROVIDER_SITE_OTHER): Payer: Medicaid Other | Admitting: Specialist

## 2016-12-01 VITALS — BP 114/77 | HR 82 | Ht 69.0 in | Wt 205.0 lb

## 2016-12-01 DIAGNOSIS — M48062 Spinal stenosis, lumbar region with neurogenic claudication: Secondary | ICD-10-CM | POA: Diagnosis not present

## 2016-12-01 DIAGNOSIS — M4726 Other spondylosis with radiculopathy, lumbar region: Secondary | ICD-10-CM | POA: Diagnosis not present

## 2016-12-01 DIAGNOSIS — M5136 Other intervertebral disc degeneration, lumbar region: Secondary | ICD-10-CM | POA: Diagnosis not present

## 2016-12-01 DIAGNOSIS — M545 Low back pain: Secondary | ICD-10-CM

## 2016-12-01 NOTE — Progress Notes (Addendum)
Office Visit Note   Patient: Erik Cook           Date of Birth: [birth year]           MRN: [id number] Visit Date: 12/01/2016              Requested by: Liliane Channel, MD [address] [place], [place] [address] PCP: Liliane Channel, MD   Assessment & Plan: Visit Diagnoses:  1. Low back pain, unspecified back pain laterality, unspecified chronicity, with sciatica presence unspecified   2. Other spondylosis with radiculopathy, lumbar region   3. Degenerative disc disease, lumbar   4. Spinal stenosis of lumbar region with neurogenic claudication   56 year old male appearing yournger than stated age with chronic disabling pain related to both left upper extremity and left lower extremity Injuries. Also status post cervical spine ACDF C6-7 for spinal stenosis. He describes symptoms of burning and tingling and numbness in both feet not explained by his current studies. His clinical exam suggest with  Absence of the right knee jerk an L3 or L4 radiculopathy. There is no  atrophy of the legs though. The report of his MRI of the lumbar spine is available but the study is not able to be reviewed, the written report suggest a right foramenal stenosis due to disc herniation and spondylosis changes. At this point EMG/NCVs would be helpful in delineating if a perpheral neuropathy such as tarsal tunnel may be present and allow for assessment for right L3 or L4 radiculopathy. With multiple areas of chronic pathology he is a challenge to determine disability and whether more surgery to going to improve his over all functional status. I explained to [name] that I would need to see the studies from [facility] and any studies done by [facility] including more recent xrays to be able to give him an adequate evaluation. Currently  His exams suggests findings related to the right L3 foramenal entrapment. 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 As Care Everywhere suggests that his previous ESI was at the L4 level I recommend he have the follow up ESI at [facility]  Already ordered and see if this improves his pain pattern. Certainly any tme a pain generator is established prior to more aggressive treatment he stands for a better chance of success. I will see him back if he wishes but I need the previous studies for review.    Plan: Avoid bending, stooping and avoid lifting weights greater than 10 lbs. Avoid prolong standing and walking. Avoid frequent bending and stooping  May use ice or moist heat for pain. Weight loss is of benefit.  Follow-Up Instructions: No Follow-up on file.   Orders:  Orders Placed This Encounter  Procedures  . XR Lumbar Spine 2-3 Views   No orders of the defined types were placed in this encounter.     Procedures: No procedures performed   Clinical Data: No additional findings.   Subjective: Chief Complaint  Patient presents with  . Lower Back - Follow-up    56 year old male with a history of left shoulder and arm difficulties,neck injuries post cervical spine fusion single level. Still with pain and numbness in the left arm and shoulder.  Had low back questions for a while and had left foot and leg pain with worsening pain into both legs. Left knee scope x 3. Stepping  off bed had shooting pain in the right leg. Had MRI of the lumbar spine and underwent injections into his lower back, injections didn't help the low back. Most the pain is in the legs at this point. Uses a walker and a cane for standing  and walking. Feels popping in the back on the left side and pain into the right leg. Stepping on the right leg next day post left knee arthroscopy in February 2018. Foot doctor did a biopsy and told him he had back issues. 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 Both knees painfule, both ankles and feet. Dec. 2012 fell in an elevator  shaft 2 stories, an in closet elevator shaft landed on his left side but grabbed items with the left arm. Balance off and could not under stand why.Back pain right upper buttock into the right posterior thigh and leg, pain in the left low back also. 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  Told he had vertebra fracture 6 months later. Bowel and bladder function is normal. Unable to walk more than about 200 yards. First arising the pain is in the right side lumbar. Previous pain in the left ankle. Had left foot fx in the fall also, not known till months later foot swelling in the boot. ACL reconstruction Shrock MD in 1999. Pain in back is constant, up and walking with real pain. Pain on scale 1-10 sometimes its a 10 other times it is 6-7. Good part of the time it is an 8 or 9. Sitting, riding a car worsen the pain, carrying grocery bags with difficulty. Paranoid, not sure when the pain may occur  and cause the right leg to give away. Sharp pain right side, like a lightening bolt. Walking and can have sharp pain. Last worked in 2012. Has short term memory issues. Might be secondary to anesthesia. Post shoulder and cervical spine, nothing was right. Takes gabapentin, multivitamin, methocarbamol 500 mg at night and hydrodiuril 25 mg daily. Sleeping poorly except with muscle relaxer and gabapentin. Pain awakens him in knees and in his back. Injection helped low back but then neck pain worsened. Now has a foster child to take care of, a 23 year old nephew. Intermittant numbness feeling of leg swelling with electrical currents up the left neck to the left side of the head, constant pressure in the neck. Previous left shoulder like an ice pick, decreased left hand grip. Divorced at early 10. He has applied for SSDI and apparently is appealing this. Has been seen at [facility] And is scheduled of an epidural steroid injection to be done in the near  future. A previous injection at the L4-5 level transforamenal did not seem to help and he describes severe HAs and neck pain associated with the previous injection.  He notes that the next ESI is to be done at a different level.     Review of Systems  Constitutional: Negative.   HENT: Negative.   Eyes: Negative.   Respiratory: Negative.   Cardiovascular: Negative.   Gastrointestinal: Negative.   Endocrine: Negative.   Genitourinary: Negative.   Musculoskeletal: Negative.   Skin: Negative.   Allergic/Immunologic: Negative.   Neurological: Negative.   Hematological: Negative.   Psychiatric/Behavioral: Negative.      Objective: Vital Signs: BP 114/77 (BP Location: Left Arm, Patient Position: Sitting)   Pulse 82   Ht  (1.753 m)   Wt 205 lb (  93 kg)   BMI 30.27 kg/m   Physical Exam  Constitutional: He is oriented to person, place, and time. He appears well-developed and well-nourished.  HENT:  Head: Normocephalic and atraumatic.  Eyes: EOM are normal. Pupils are equal, round, and reactive to light.  Neck: Normal range of motion. Neck supple.  Pulmonary/Chest: Effort normal and breath sounds normal.  Abdominal: Soft. Bowel sounds are normal.  Musculoskeletal: Normal range of motion.  Neurological: He is alert and oriented to person, place, and time.  Skin: Skin is warm and dry.  Psychiatric: He has a normal mood and affect. His behavior is normal. Judgment and thought content normal.    Back Exam   Tenderness  The patient is experiencing tenderness in the lumbar.  Range of Motion  Extension: normal  Flexion: 30  Lateral Bend Right: normal  Lateral Bend Left: normal  Rotation Right: normal  Rotation Left: normal   Muscle Strength  Right Quadriceps:  4/5  Left Quadriceps:  5/5  Right Hamstrings:  5/5  Left Hamstrings:  5/5   Tests  Straight leg raise right: negative Straight leg raise left: negative  Reflexes  Patellar:  0/4 abnormal Achilles:  normal Babinski's sign: normal   Other  Heel Walk: normal Sensation: normal Gait: antalgic  Erythema: no back redness Scars: absent  Comments:  Weak in right foot DF and right foot plantarflexion.      Specialty Comments:  No specialty comments available.  Imaging: No results found.   PMFS History: There are no active problems to display for this patient.  Past Medical History:  Diagnosis Date  . Hypertension     No family history on file.  Past Surgical History:  Procedure Laterality Date  . 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 KNEE ARTHROSCOPY     left  . KNEE ARTHROSCOPY     on left side again  . ROTATOR CUFF REPAIR     left  . ROTATOR CUFF REPAIR     2nd time around (after fall)   Social History   Occupational History  . Not on file.   Social History Main Topics  . Smoking status: Never Smoker  . Smokeless tobacco: Never Used  . Alcohol use No  . Drug use: No  . Sexual activity: Not on file

## 2016-12-01 NOTE — Patient Instructions (Signed)
  Avoid bending, stooping and avoid lifting weights greater than 10 lbs. Avoid prolong standing and walking. Avoid frequent bending and stooping  May use ice or moist heat for pain. Weight loss is of benefit.

## 2016-12-01 NOTE — Telephone Encounter (Signed)
Returned call to patient left message to call back  [phone]

## 2016-12-02 ENCOUNTER — Telehealth (INDEPENDENT_AMBULATORY_CARE_PROVIDER_SITE_OTHER): Payer: Self-pay | Admitting: Specialist

## 2016-12-02 NOTE — Telephone Encounter (Signed)
PT CALLED TO BE ADVISED TO IF HE CAN GET SPINAL INJECTIONS. PLEASE ADVISE.  [phone]

## 2016-12-03 ENCOUNTER — Telehealth (INDEPENDENT_AMBULATORY_CARE_PROVIDER_SITE_OTHER): Payer: Self-pay

## 2016-12-03 NOTE — Telephone Encounter (Signed)
PT CALLED TO BE ADVISED TO IF HE CAN GET SPINAL INJECTIONS. PLEASE ADVISE

## 2016-12-03 NOTE — Telephone Encounter (Signed)
FYI-  Patient wanted to let Dr. NItka know that he canceled all his appointments with the other doctor and would like to continue care with him. 

## 2016-12-03 NOTE — Telephone Encounter (Signed)
FYI-  Patient wanted to let Dr. Otelia SergeantNItka know that he canceled all his appointments with the other doctor and would like to continue care with him.

## 2016-12-04 NOTE — Telephone Encounter (Signed)
It is my understanding that this patient was scheduled via [facility] for a further ESI at a higher level, we discussed this at his appointment. I recommended he go ahead with that injection and follow up with us if he wishes. jen

## 2016-12-08 ENCOUNTER — Telehealth (INDEPENDENT_AMBULATORY_CARE_PROVIDER_SITE_OTHER): Payer: Self-pay | Admitting: Radiology

## 2016-12-08 NOTE — Telephone Encounter (Signed)
I called and spoke with patient, he states that when [facility] that they did not have him scheduled for injections and that they dropped the ball on him and he does NOT want to see them at all.  He wants to see if you can set up the injections here for him and take over his care.

## 2016-12-08 NOTE — Telephone Encounter (Signed)
Per Dr. Otelia SergeantNitka he said to have patient to get all of his [facility] to us so he can review what has been done so that we don't duplicate any test.---patient will get to work on this ASAP.

## 2016-12-11 NOTE — Telephone Encounter (Signed)
Before I can order any testing I will need to see his notes from [facility] and from [facility] and any other provider he has seen and undergone testing from the time he developed his back condition. I need the CD copy of the imaging studies, it is not our responsibility to obtain these, they are his studies. I can not order  Injections based on written studies without seeing the images and knowing what has already been done. I f he is in a pain management program I would like to have notes concerning their care and treatment. I am not a pain management physician and I would recommend that he keep his current pain management if he is in a pain management program.

## 2016-12-11 NOTE — Telephone Encounter (Signed)
Patient is aware of this and he is working on this.

## 2016-12-31 ENCOUNTER — Ambulatory Visit (INDEPENDENT_AMBULATORY_CARE_PROVIDER_SITE_OTHER): Payer: Medicaid Other | Admitting: Specialist

## 2016-12-31 ENCOUNTER — Encounter (INDEPENDENT_AMBULATORY_CARE_PROVIDER_SITE_OTHER): Payer: Self-pay | Admitting: Specialist

## 2016-12-31 VITALS — BP 119/79 | HR 83 | Ht 69.0 in | Wt 205.0 lb

## 2016-12-31 DIAGNOSIS — M47812 Spondylosis without myelopathy or radiculopathy, cervical region: Secondary | ICD-10-CM

## 2016-12-31 DIAGNOSIS — M5136 Other intervertebral disc degeneration, lumbar region: Secondary | ICD-10-CM

## 2016-12-31 MED ORDER — MELOXICAM 15 MG PO TABS: 15.0000 mg | ORAL_TABLET | Freq: Every day | ORAL | 6 refills | Status: DC

## 2016-12-31 MED ORDER — GABAPENTIN 300 MG PO CAPS: 300.0000 mg | ORAL_CAPSULE | Freq: Three times a day (TID) | ORAL | 3 refills | Status: DC

## 2016-12-31 NOTE — Addendum Note (Signed)
Addended by: Vira BrownsNITKA, JAMES on: 12/31/2016 05:11 PM   Modules accepted: Orders, Level of Service

## 2016-12-31 NOTE — Patient Instructions (Signed)
Avoid overhead lifting and overhead use of the arms. Do not lift greater than 5 lbs. Adjust head rest in vehicle to prevent hyperextension if rear ended. Avoid bending, stooping and avoid lifting weights greater than 10 lbs. Avoid prolong standing and walking. Avoid frequent bending and stooping  No lifting greater than 10 lbs. May use ice or moist heat for pain. Weight loss is of benefit. Handicap license is approved. Dr. Newton's secretary/Assistant will call to arrange for epidural steroid injection   

## 2016-12-31 NOTE — Progress Notes (Signed)
Office Visit Note   Patient: Erik Cook           Date of Birth: 11/03/1960           MRN: [id number] Visit Date: 12/31/2016              Requested by: Liliane Channelurner, Wendal L, MD [address] [place], [place] [id number] PCP: Liliane Channelurner, Narada L, MD   Assessment & Plan: Visit Diagnoses:  1. Spondylosis without myelopathy or radiculopathy, cervical region   2. Degenerative disc disease, lumbar     Plan: Avoid overhead lifting and overhead use of the arms. Do not lift greater than 5 lbs. Adjust head rest in vehicle to prevent hyperextension if rear ended. .  Avoid bending, stooping and avoid lifting weights greater than 10 lbs. Avoid prolong standing and walking. Avoid frequent bending and stooping  No lifting greater than 10 lbs. May use ice or moist heat for pain. Weight loss is of benefit. Handicap license is approved. Dr. Newton Hamilton BlasNewton's secretary/Assistant will call to arrange for epidural steroid injection   Follow-Up Instructions: Return in about 4 weeks (around 01/28/2017).   Orders:  No orders of the defined types were placed in this encounter.  Meds ordered this encounter  Medications  . meloxicam (MOBIC) 15 MG tablet    Sig: Take 1 tablet (15 mg total) by mouth daily.    Dispense:  30 tablet    Refill:  6  . gabapentin (NEURONTIN) 300 MG capsule    Sig: Take 1 capsule (300 mg total) by mouth 3 (three) times daily.    Dispense:  90 capsule    Refill:  3      Procedures: No procedures performed   Clinical Data: No additional findings.   Subjective: Chief Complaint  Patient presents with  . Lower Back - Follow-up, Pain    56 year old male with problems of the lumbar spine and the cervical spine. Fall in [place] open [employer] shaft, some falls since then as well. Presently he is unemployed. No workman's compensation. The WC case settled, he has issues of memory loss, and changing a memory card out in phones recently he became lost. Single story  fall with left sided injuries. Dr[facility] Philbert Riserouy [facility], the shoulder specialist and Dr. Shon BatonBrooks [facility] have seen and treated him.     Review of Systems   Objective: Vital Signs: BP 119/79 (BP Location: Left Arm, Patient Position: Sitting)   Pulse 83   Ht 5\' 9"  (1.753 m)   Wt 205 lb (93 kg)   BMI 30.27 kg/m   Physical Exam  Ortho Exam  Specialty Comments:  No specialty comments available.  Imaging: No results found.   PMFS History: There are no active problems to display for this patient.  Past Medical History:  Diagnosis Date  . Hypertension     No family history on file.  Past Surgical History:  Procedure Laterality Date  . 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 KNEE ARTHROSCOPY     left  . KNEE ARTHROSCOPY     on left side again  . ROTATOR CUFF REPAIR     left  . ROTATOR CUFF REPAIR     2nd time around (after fall)   Social History   Occupational History  .  Not on file.   Social History Main Topics  . Smoking status: Never Smoker  . Smokeless tobacco: Never Used  . Alcohol use No  . Drug use: No  . Sexual activity: Not on file

## 2017-01-12 ENCOUNTER — Telehealth (INDEPENDENT_AMBULATORY_CARE_PROVIDER_SITE_OTHER): Payer: Self-pay | Admitting: Specialist

## 2017-01-12 NOTE — Telephone Encounter (Signed)
Patient called asked if he can get a call back confirming that the CD he mailed Thursday or Friday of last week. Patient said someone would have had to sign to get the CD. The number to contact patient is [phone]

## 2017-01-13 ENCOUNTER — Telehealth (INDEPENDENT_AMBULATORY_CARE_PROVIDER_SITE_OTHER): Payer: Self-pay | Admitting: Specialist

## 2017-01-13 NOTE — Telephone Encounter (Signed)
Patient called asking if we received his MRI info? Also if Dr. Otelia SergeantNitka could review the MRI and set up the injections if needed since he's on a time crunch with his insurance. CB # [phone] If you could please call back before 1 pm, will be in court the whole afternoon.

## 2017-01-13 NOTE — Telephone Encounter (Signed)
I called and advised that we did receive the CD on Monday and Dr. Otelia SergeantNitka does have it for review.  Patient may be losing his insurance either this month or next month.  So he is really wanting to get the injections in his back scheduled and done before his appt with Dr. Otelia SergeantNitka at end of the month, so it will be a follow up appt after the injection. He will be in court this afternoon most of the time.

## 2017-01-13 NOTE — Telephone Encounter (Signed)
See other message

## 2017-01-19 ENCOUNTER — Telehealth (INDEPENDENT_AMBULATORY_CARE_PROVIDER_SITE_OTHER): Payer: Self-pay | Admitting: Specialist

## 2017-01-19 NOTE — Telephone Encounter (Signed)
Received voicemail message from patient asking if Dr Otelia SergeantNitka got a chance to review his MRI yet. Patient asked if Dr Otelia SergeantNitka will need to order any injections prior to his visit at the end of the month. The number to contact patient is [phone]

## 2017-01-28 ENCOUNTER — Ambulatory Visit (INDEPENDENT_AMBULATORY_CARE_PROVIDER_SITE_OTHER): Payer: Medicaid Other | Admitting: Specialist

## 2017-01-28 ENCOUNTER — Encounter (INDEPENDENT_AMBULATORY_CARE_PROVIDER_SITE_OTHER): Payer: Self-pay | Admitting: Specialist

## 2017-01-28 VITALS — BP 128/89 | HR 63 | Ht 69.0 in | Wt 205.0 lb

## 2017-01-28 DIAGNOSIS — M5137 Other intervertebral disc degeneration, lumbosacral region: Secondary | ICD-10-CM

## 2017-01-28 DIAGNOSIS — M48062 Spinal stenosis, lumbar region with neurogenic claudication: Secondary | ICD-10-CM

## 2017-01-28 NOTE — Progress Notes (Signed)
Office Visit Note   Patient: Erik Cook           Date of Birth: Aug 25, 1960           MRN: [id number] Visit Date: 01/28/2017              Requested by: Liliane Channel, MD [address] [place], [place] [address] PCP: Liliane Channel, MD   Assessment & Plan: Visit Diagnoses:  1. Spinal stenosis of lumbar region with neurogenic claudication   2. Disc disease, degenerative, lumbar or lumbosacral     Plan:Avoid bending, stooping and avoid lifting weights greater than 10 lbs. Avoid prolong standing and walking. Avoid frequent bending and stooping  No lifting greater than 10 lbs. May use ice or moist heat for pain. Weight loss is of benefit. Handicap license is approved. Dr. [place] Blas secretary/Assistant will call to arrange for EMGs and NCVs with possible decision for an epidural steroid injection  Exercise with stationary bicycle and pool walking exercises.  Follow-Up Instructions: Return in about 4 weeks (around 02/25/2017).   Orders:  No orders of the defined types were placed in this encounter.  No orders of the defined types were placed in this encounter.     Procedures: No procedures performed   Clinical Data: No additional findings.   Subjective: Chief Complaint  Patient presents with  . Lower Back - Follow-up    HPI  Review of Systems  HENT: Negative.   Eyes: Negative.   Respiratory: Negative.   Cardiovascular: Negative.   Gastrointestinal: Negative.   Endocrine: Negative.   Genitourinary: Negative.   Musculoskeletal: Positive for back pain and gait problem.  Skin: Negative.   Allergic/Immunologic: Negative.   Neurological: Positive for weakness and numbness.  Hematological: Negative.   Psychiatric/Behavioral: Negative.      Objective: Vital Signs: BP 128/89 (BP Location: Left Arm, Patient Position: Sitting)   Pulse 63   Ht 5\' 9"  (1.753 m)   Wt 205 lb (93 kg)   BMI 30.27 kg/m   Physical Exam  Constitutional: He is  oriented to person, place, and time. He appears well-developed and well-nourished.  HENT:  Head: Normocephalic and atraumatic.  Eyes: EOM are normal. Pupils are equal, round, and reactive to light.  Neck: Normal range of motion. Neck supple.  Pulmonary/Chest: Effort normal and breath sounds normal.  Abdominal: Soft. Bowel sounds are normal.  Neurological: He is alert and oriented to person, place, and time.  Skin: Skin is warm and dry.  Psychiatric: He has a normal mood and affect. His behavior is normal. Judgment and thought content normal.    Back Exam   Tenderness  The patient is experiencing tenderness in the lumbar.  Range of Motion  Extension: abnormal  Flexion: normal  Lateral Bend Right: abnormal  Lateral Bend Left: abnormal  Rotation Right: normal  Rotation Left: normal   Muscle Strength  Right Quadriceps:  5/5  Left Quadriceps:  5/5  Right Hamstrings:  5/5  Left Hamstrings:  5/5   Tests  Straight leg raise right: negative Straight leg raise left: negative  Reflexes  Patellar: normal Achilles: normal Babinski's sign: normal   Other  Toe Walk: normal Heel Walk: normal Gait: normal  Erythema: no back redness Scars: absent      Specialty Comments:  No specialty comments available.  Imaging: No results found.   PMFS History: There are no active problems to display for this patient.  Past Medical History:  Diagnosis Date  .  Hypertension     No family history on file.  Past Surgical History:  Procedure Laterality Date  . 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 KNEE ARTHROSCOPY     left  . KNEE ARTHROSCOPY     on left side again  . ROTATOR CUFF REPAIR     left  . ROTATOR CUFF REPAIR     2nd time around (after fall)    Social History   Occupational History  . Not on file.   Social History Main Topics  . Smoking status: Never Smoker  . Smokeless tobacco: Never Used  . Alcohol use No  . Drug use: No  . Sexual activity: Not on file

## 2017-01-28 NOTE — Patient Instructions (Signed)
Avoid bending, stooping and avoid lifting weights greater than 10 lbs. Avoid prolong standing and walking. Avoid frequent bending and stooping  No lifting greater than 10 lbs. May use ice or moist heat for pain. Weight loss is of benefit. Handicap license is approved. Dr. Robertson BlasNewton's secretary/Assistant will call to arrange for EMGs and NCVs with possible decision for an epidural steroid injection  Exercise with stationary bicycle and pool walking exercises.

## 2017-01-29 ENCOUNTER — Telehealth (INDEPENDENT_AMBULATORY_CARE_PROVIDER_SITE_OTHER): Payer: Self-pay | Admitting: Physical Medicine and Rehabilitation

## 2017-01-29 NOTE — Telephone Encounter (Signed)
Patient returned phone call. CB # [phone]

## 2017-02-01 NOTE — Telephone Encounter (Signed)
Seen in office on Thursday 6/28 orders placed for Dr. Alvester MorinNewton to perform EMG/NCV, based on this study may consider  ESIs as [facility] was not of benefit.

## 2017-02-11 ENCOUNTER — Encounter (INDEPENDENT_AMBULATORY_CARE_PROVIDER_SITE_OTHER): Payer: Self-pay | Admitting: Physical Medicine and Rehabilitation

## 2017-02-11 ENCOUNTER — Ambulatory Visit (INDEPENDENT_AMBULATORY_CARE_PROVIDER_SITE_OTHER): Payer: Medicare Other | Admitting: Physical Medicine and Rehabilitation

## 2017-02-11 DIAGNOSIS — R202 Paresthesia of skin: Secondary | ICD-10-CM

## 2017-02-11 NOTE — Progress Notes (Deleted)
Pain and loss of motor control in legs and feet. Severe pain down right leg. Difficulty sleeping. Difficulty lifting leg to step over things. Pain in right knee and foot. Burning sensation bottom of right foot. Popping and pinching left side of lower back. Has had problems on the left for longer, but the right side is worse now.

## 2017-02-12 NOTE — Procedures (Signed)
EMG & NCV Findings: Evaluation of the left saphenous sensory and the left superficial fibular sensory nerves showed reduced amplitude (L0.6, L4.2 V).  All remaining nerves (as indicated in the following tables) were within normal limits.  All left vs. right side differences were within normal limits.    All examined muscles (as indicated in the following table) showed no evidence of electrical instability.    Impression: Essentially NORMAL electrodiagnostic study of both lower limbs.  There is no significant electrodiagnostic evidence of nerve entrapment, lumbosacral plexopathy, lumbar radiculopathy or generalized peripheral neuropathy.    As you know, purely sensory or demyelinating radiculopathies and chemical radiculitis may not be detected with this particular electrodiagnostic study.  Recommendations: 1.  Follow-up with referring physician. 2.  Continue current management of symptoms.   Nerve Conduction Studies Anti Sensory Summary Table   Stim Site NR Peak (ms) Norm Peak (ms) P-T Amp (V) Norm P-T Amp Site1 Site2 Delta-P (ms) Dist (cm) Vel (m/s) Norm Vel (m/s)  Left Saphenous Anti Sensory (Ant Med Mall)  31.9C  14cm    3.0 <4.4 *0.6 >2 14cm Ant Med Mall 3.0 0.0  >32  Right Saphenous Anti Sensory (Ant Med Mall)  29.3C  14cm    3.3 <4.4 23.5 >2 14cm Ant Med Mall 3.3 0.0  >32  Left Sup Fibular Anti Sensory (Ant Lat Mall)  32C  14 cm    3.0 <4.4 *4.2 >5.0 14 cm Ant Lat Mall 3.0 14.0 47 >32  Right Sup Fibular Anti Sensory (Ant Lat Mall)  29.3C  14 cm    3.3 <4.4 7.4 >5.0 14 cm Ant Lat Mall 3.3 14.0 42 >32  Left Sural Anti Sensory (Lat Mall)  31.8C  Calf    3.0 <4.0 8.8 >5.0 Calf Lat Mall 3.0 14.0 47 >35  Right Sural Anti Sensory (Lat Mall)  29.1C  Calf    3.2 <4.0 17.2 >5.0 Calf Lat Mall 3.2 14.0 44 >35   Motor Summary Table   Stim Site NR Onset (ms) Norm Onset (ms) O-P Amp (mV) Norm O-P Amp Site1 Site2 Delta-0 (ms) Dist (cm) Vel (m/s) Norm Vel (m/s)  Left Fibular Motor  (Ext Dig Brev)  31.7C  Ankle    3.6 <6.1 5.3 >2.5 B Fib Ankle 6.9 35.0 51 >38  B Fib    10.5  4.3  Poplt B Fib 2.1 10.0 48 >40  Poplt    12.6  4.0         Right Fibular Motor (Ext Dig Brev)  29.5C  Ankle    3.7 <6.1 3.0 >2.5 B Fib Ankle 7.0 32.5 46 >38  B Fib    10.7  1.5  Poplt B Fib 1.7 10.0 59 >40  Poplt    12.4  2.8         Left Tibial Motor (Abd Hall Brev)  31.9C  Ankle    3.9 <6.1 8.7 >3.0 Knee Ankle 8.6 38.0 44 >35  Knee    12.5  3.6         Right Tibial Motor (Abd Hall Brev)  29.4C  Ankle    4.1 <6.1 10.2 >3.0 Knee Ankle 9.0 42.0 47 >35  Knee    13.1  4.1          EMG   Side Muscle Nerve Root Ins Act Fibs Psw Amp Dur Poly Recrt Int Dennie Bible Comment  Left AntTibialis Dp Br Peron L4-5 Nml Nml Nml Nml Nml 0 Nml Nml   Left Fibularis Longus  Sup Br Peron L5-S1 Nml Nml Nml Nml Nml 0 Nml Nml   Left MedGastroc Tibial S1-2 Nml Nml Nml Nml Nml 0 Nml Nml   Left VastusMed Femoral L2-4 Nml Nml Nml Nml Nml 0 Nml Nml   Left BicepsFemS Sciatic L5-S1 Nml Nml Nml Nml Nml 0 Nml Nml   Right AntTibialis Dp Br Peron L4-5 Nml Nml Nml Nml Nml 0 Nml Nml   Right Fibularis Longus  Sup Br Peron L5-S1 Nml Nml Nml Nml Nml 0 Nml Nml   Right MedGastroc Tibial S1-2 Nml Nml Nml Nml Nml 0 Nml Nml   Right VastusMed Femoral L2-4 Nml Nml Nml Nml Nml 0 Nml Nml   Right BicepsFemS Sciatic L5-S1 Nml Nml Nml Nml Nml 0 Nml Nml     Nerve Conduction Studies Anti Sensory Left/Right Comparison   Stim Site L Lat (ms) R Lat (ms) L-R Lat (ms) L Amp (V) R Amp (V) L-R Amp (%) Site1 Site2 L Vel (m/s) R Vel (m/s) L-R Vel (m/s)  Saphenous Anti Sensory (Ant Med Mall)  31.9C  14cm 3.0 3.3 0.3 *0.6 23.5 97.4 14cm Ant Med Mall     Sup Fibular Anti Sensory (Ant Lat Mall)  32C  14 cm 3.0 3.3 0.3 *4.2 7.4 43.2 14 cm Ant Lat Mall 47 42 5  Sural Anti Sensory (Lat Mall)  31.8C  Calf 3.0 3.2 0.2 8.8 17.2 48.8 Calf Lat Mall 47 44 3   Motor Left/Right Comparison   Stim Site L Lat (ms) R Lat (ms) L-R Lat (ms) L Amp (mV) R Amp (mV)  L-R Amp (%) Site1 Site2 L Vel (m/s) R Vel (m/s) L-R Vel (m/s)  Fibular Motor (Ext Dig Brev)  31.7C  Ankle 3.6 3.7 0.1 5.3 3.0 43.4 B Fib Ankle 51 46 5  B Fib 10.5 10.7 0.2 4.3 1.5 65.1 Poplt B Fib 48 59 11  Poplt 12.6 12.4 0.2 4.0 2.8 30.0       Tibial Motor (Abd Hall Brev)  31.9C  Ankle 3.9 4.1 0.2 8.7 10.2 14.7 Knee Ankle 44 47 3  Knee 12.5 13.1 0.6 3.6 4.1 12.2

## 2017-02-12 NOTE — Progress Notes (Signed)
Erik Cook - 56 y.o. male MRN [id number]  Date of birth: 08/01/1961  Office Visit Note: Visit Date: 02/11/2017 PCP: Erik Aspen, MD Referred by: Erik Channel, MD  Subjective: Chief Complaint  Patient presents with  . Right Leg - Pain  . Left Leg - Pain   HPI: Erik Cook is a 56 year old gentleman who reports chronic worsening back and leg pain with what he reports as pain and loss of motor control in both legs and feet. He has more severe pain down the right leg in a posterior lateral fashion into the anterior lateral calf. He reports having difficulty actually lifting the right leg to step over things. He also has a lot of pain in the right knee and foot. He has a multitude of somatic complaints in general. He reports a burning sensation that really refers to the bottom of the right foot. He reports pinching to the left side of the lower back as well with a popping sensation. He reports that the left side and bothered him longer to the right side is worse. He has had a prior lumbar spine MRI in February of this year this is reviewed in full below. It mainly show some foraminal narrowing and lateral recess narrowing but without focal compression or disc herniation or central stenosis. Nothing on the lumbar MRI would impact his motor control release from an imaging standpoint. He's been following [facility] before Dr. Otelia Sergeant. He had bilateral L4 transforaminal injections with really only relief the first day that it was completed. He has not had prior electrodiagnostic studies of the legs. He evidently has a lift diagnostic study of the upper extremities due to prior ACDF.    ROS Otherwise per HPI.  Assessment & Plan: Visit Diagnoses:  1. Paresthesia of skin     Plan: No additional findings.  Impression: Essentially NORMAL electrodiagnostic study of both lower limbs.  There is no significant electrodiagnostic evidence of nerve entrapment, lumbosacral plexopathy, lumbar  radiculopathy or generalized peripheral neuropathy.    As you know, purely sensory or demyelinating radiculopathies and chemical radiculitis may not be detected with this particular electrodiagnostic study. Part of his symptoms clinically to correlate with an L5 radiculitis.  Recommendations: 1.  Follow-up with referring physician. 2.  Continue current management of symptoms.   Meds & Orders: No orders of the defined types were placed in this encounter.   Orders Placed This Encounter  Procedures  . NCV with EMG (electromyography)    Follow-up: Return for Dr. Otelia Sergeant.   Procedures: No procedures performed  EMG & NCV Findings: Evaluation of the left saphenous sensory and the left superficial fibular sensory nerves showed reduced amplitude (L0.6, L4.2 V).  All remaining nerves (as indicated in the following tables) were within normal limits.  All left vs. right side differences were within normal limits.    All examined muscles (as indicated in the following table) showed no evidence of electrical instability.    Impression: Essentially NORMAL electrodiagnostic study of both lower limbs.  There is no significant electrodiagnostic evidence of nerve entrapment, lumbosacral plexopathy, lumbar radiculopathy or generalized peripheral neuropathy.    As you know, purely sensory or demyelinating radiculopathies and chemical radiculitis may not be detected with this particular electrodiagnostic study.  Recommendations: 1.  Follow-up with referring physician. 2.  Continue current management of symptoms.   Nerve Conduction Studies Anti Sensory Summary Table   Stim Site NR Peak (ms) Norm Peak (ms) P-T Amp (V) Norm P-T Amp Site1  Site2 Delta-P (ms) Dist (cm) Vel (m/s) Norm Vel (m/s)  Left Saphenous Anti Sensory (Ant Med Mall)  31.9C  14cm    3.0 <4.4 *0.6 >2 14cm Ant Med Mall 3.0 0.0  >32  Right Saphenous Anti Sensory (Ant Med Mall)  29.3C  14cm    3.3 <4.4 23.5 >2 14cm Ant Med Mall 3.3 0.0   >32  Left Sup Fibular Anti Sensory (Ant Lat Mall)  32C  14 cm    3.0 <4.4 *4.2 >5.0 14 cm Ant Lat Mall 3.0 14.0 47 >32  Right Sup Fibular Anti Sensory (Ant Lat Mall)  29.3C  14 cm    3.3 <4.4 7.4 >5.0 14 cm Ant Lat Mall 3.3 14.0 42 >32  Left Sural Anti Sensory (Lat Mall)  31.8C  Calf    3.0 <4.0 8.8 >5.0 Calf Lat Mall 3.0 14.0 47 >35  Right Sural Anti Sensory (Lat Mall)  29.1C  Calf    3.2 <4.0 17.2 >5.0 Calf Lat Mall 3.2 14.0 44 >35   Motor Summary Table   Stim Site NR Onset (ms) Norm Onset (ms) O-P Amp (mV) Norm O-P Amp Site1 Site2 Delta-0 (ms) Dist (cm) Vel (m/s) Norm Vel (m/s)  Left Fibular Motor (Ext Dig Brev)  31.7C  Ankle    3.6 <6.1 5.3 >2.5 B Fib Ankle 6.9 35.0 51 >38  B Fib    10.5  4.3  Poplt B Fib 2.1 10.0 48 >40  Poplt    12.6  4.0         Right Fibular Motor (Ext Dig Brev)  29.5C  Ankle    3.7 <6.1 3.0 >2.5 B Fib Ankle 7.0 32.5 46 >38  B Fib    10.7  1.5  Poplt B Fib 1.7 10.0 59 >40  Poplt    12.4  2.8         Left Tibial Motor (Abd Hall Brev)  31.9C  Ankle    3.9 <6.1 8.7 >3.0 Knee Ankle 8.6 38.0 44 >35  Knee    12.5  3.6         Right Tibial Motor (Abd Hall Brev)  29.4C  Ankle    4.1 <6.1 10.2 >3.0 Knee Ankle 9.0 42.0 47 >35  Knee    13.1  4.1          EMG   Side Muscle Nerve Root Ins Act Fibs Psw Amp Dur Poly Recrt Int Dennie BiblePat Comment  Left AntTibialis Dp Br Peron L4-5 Nml Nml Nml Nml Nml 0 Nml Nml   Left Fibularis Longus  Sup Br Peron L5-S1 Nml Nml Nml Nml Nml 0 Nml Nml   Left MedGastroc Tibial S1-2 Nml Nml Nml Nml Nml 0 Nml Nml   Left VastusMed Femoral L2-4 Nml Nml Nml Nml Nml 0 Nml Nml   Left BicepsFemS Sciatic L5-S1 Nml Nml Nml Nml Nml 0 Nml Nml   Right AntTibialis Dp Br Peron L4-5 Nml Nml Nml Nml Nml 0 Nml Nml   Right Fibularis Longus  Sup Br Peron L5-S1 Nml Nml Nml Nml Nml 0 Nml Nml   Right MedGastroc Tibial S1-2 Nml Nml Nml Nml Nml 0 Nml Nml   Right VastusMed Femoral L2-4 Nml Nml Nml Nml Nml 0 Nml Nml   Right BicepsFemS Sciatic L5-S1 Nml Nml Nml  Nml Nml 0 Nml Nml     Nerve Conduction Studies Anti Sensory Left/Right Comparison   Stim Site L Lat (ms) R Lat (ms) L-R Lat (ms) L Amp (V) R Amp (  V) L-R Amp (%) Site1 Site2 L Vel (m/s) R Vel (m/s) L-R Vel (m/s)  Saphenous Anti Sensory (Ant Med Mall)  31.9C  14cm 3.0 3.3 0.3 *0.6 23.5 97.4 14cm Ant Med Mall     Sup Fibular Anti Sensory (Ant Lat Mall)  32C  14 cm 3.0 3.3 0.3 *4.2 7.4 43.2 14 cm Ant Lat Mall 47 42 5  Sural Anti Sensory (Lat Mall)  31.8C  Calf 3.0 3.2 0.2 8.8 17.2 48.8 Calf Lat Mall 47 44 3   Motor Left/Right Comparison   Stim Site L Lat (ms) R Lat (ms) L-R Lat (ms) L Amp (mV) R Amp (mV) L-R Amp (%) Site1 Site2 L Vel (m/s) R Vel (m/s) L-R Vel (m/s)  Fibular Motor (Ext Dig Brev)  31.7C  Ankle 3.6 3.7 0.1 5.3 3.0 43.4 B Fib Ankle 51 46 5  B Fib 10.5 10.7 0.2 4.3 1.5 65.1 Poplt B Fib 48 59 11  Poplt 12.6 12.4 0.2 4.0 2.8 30.0       Tibial Motor (Abd Hall Brev)  31.9C  Ankle 3.9 4.1 0.2 8.7 10.2 14.7 Knee Ankle 44 47 3  Knee 12.5 13.1 0.6 3.6 4.1 12.2             Clinical History: Op Note - Janan Halter, MD - 10/05/2016 11:49 AM EST Procedure:   1) Bilateral L4-L5 Transforaminal Epidural Steroid Injection 2) Fluoroscopy  Surgeon: Dr. Rickey Barbara  Assistant: Lawrence Marseilles  FINDINGS: 09/19/2016 Lumbar Spine MRI  Alignment: Trace anterolisthesis of L5 on S1. Vertebrae: No marrow signal abnormalities to suggest neoplasm. 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 Scattered in the ventricular compatible Schmorl's nodes. Conus: In normal position without imaging evidence for tethering. Normal signal and contour. Degenerative changes: T11-T12: Only seen on the sagittal images. No significant focal abnormality. T12-L1: No significant focal abnormality. L1-L2: No significant focal abnormality. L2-L3: Focal irregularity of the inferior endplate of L2 compatible with Schmorl's  node formation. Disc bulge and minimal facet hypertrophy contributes to mild bilateral foraminal stenosis. No significant central stenosis. L3-L4: Disc height and signal loss. Disc bulge with a superimposed right foraminal disc herniation and osteophytic ridging, and facet hypertrophy, contribute to advanced right foraminal stenosis. There is narrowing of the right lateral recess. Mild central stenosis. L4-L5: Disc bulge, foraminal osteophytic ridging, and facet hypertrophy contribute to lateral recess narrowing and mild bilateral foraminal stenosis. L5-S1: Disc bulge and facet hypertrophy contribute to mild bilateral foraminal stenosis. Right facet joint effusion. Upper sacrum/ilium: No significant focal abnormality.  He reports that he has never smoked. He has never used smokeless tobacco. No results for input(s): HGBA1C, LABURIC in the last 8760 hours.  Objective:  VS:  HT:    WT:   BMI:     BP:   HR: bpm  TEMP: ( )  RESP:  Physical Exam  Musculoskeletal:  Examination of the lower extremities shows well developed musculature with some mild likely age-related atrophy of the EDB musculature right more than left. There is no swelling or temperature change or allodynia. He has good strength EHL and dorsiflexion plantarflexion. He has no clonus.    Ortho Exam Imaging: No results found.  Past Medical/Family/Surgical/Social History: Medications & Allergies reviewed per EMR There are no active problems to display for this patient.  Past Medical History:  Diagnosis Date  . Hypertension    History reviewed. No pertinent family history. Past Surgical History:  Procedure Laterality Date  .  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 KNEE ARTHROSCOPY     left  . KNEE  ARTHROSCOPY     on left side again  . ROTATOR CUFF REPAIR     left  . ROTATOR CUFF REPAIR     2nd time around (after fall)   Social History   Occupational History  . Not on file.   Social History Main Topics  . Smoking status: Never Smoker  . Smokeless tobacco: Never Used  . Alcohol use No  . Drug use: No  . Sexual activity: Not on file

## 2017-02-17 ENCOUNTER — Telehealth (INDEPENDENT_AMBULATORY_CARE_PROVIDER_SITE_OTHER): Payer: Self-pay

## 2017-02-17 NOTE — Telephone Encounter (Signed)
Pt left vm stating that when he had the emg/ncs last week that Dr. Alvester MorinNewton had stated he was going to send a message to Dr. Otelia SergeantNitka regarding seeing if he thought pt needed to be scheduled for an injection. I see where Dr. Alvester MorinNewton cc'd you and Dr. Otelia SergeantNitka the report. Do we need to get him set up or wait until he has f/u with Dr. Otelia SergeantNitka?

## 2017-02-17 NOTE — Telephone Encounter (Signed)
Pt left vm stating that when he had the emg/ncs last week that Dr. Newton had stated he was going to send a message to Dr. Nitka regarding seeing if he thought pt needed to be scheduled for an injection. I see where Dr. Newton cc'd you and Dr. Nitka the report. Do we need to get him set up or wait until he has f/u with Dr. Nitka? 

## 2017-02-25 ENCOUNTER — Encounter (INDEPENDENT_AMBULATORY_CARE_PROVIDER_SITE_OTHER): Payer: Self-pay | Admitting: Specialist

## 2017-02-25 ENCOUNTER — Ambulatory Visit (INDEPENDENT_AMBULATORY_CARE_PROVIDER_SITE_OTHER): Payer: Medicare Other | Admitting: Specialist

## 2017-02-25 VITALS — BP 129/80 | HR 80 | Ht 69.0 in | Wt 205.0 lb

## 2017-02-25 DIAGNOSIS — M47816 Spondylosis without myelopathy or radiculopathy, lumbar region: Secondary | ICD-10-CM

## 2017-02-25 DIAGNOSIS — M48062 Spinal stenosis, lumbar region with neurogenic claudication: Secondary | ICD-10-CM | POA: Diagnosis not present

## 2017-02-25 NOTE — Progress Notes (Signed)
   Office Visit Note   Patient: Erik Cook           Date of Birth: 10/26/1960           MRN: [id number] Visit Date: 02/25/2017              Requested by: Liliane Channelurner, Indy L, MD [address] [place] [id number] PCP: Emilio AspenJacobucci, Nicola J, MD   Assessment & Plan: Visit Diagnoses:  1. Spinal stenosis of lumbar region with neurogenic claudication   2. Spondylosis of lumbar spine     Plan: Avoid bending, stooping and avoid lifting weights greater than 10 lbs. Avoid prolong standing and walking. Avoid frequent bending and stooping  No lifting greater than 10 lbs. May use ice or moist heat for pain. Weight loss is of benefit. Handicap license is approved. Dr. Luis M. Cintron BlasNewton's secretary/Assistant will call to arrange for facet block left L2-3,L3-4, L4-5 and L5-S1 steroid/marcaine injection then in two weeks consider injections of the right side  Follow-Up Instructions: No Follow-up on file.   Orders:  No orders of the defined types were placed in this encounter.  No orders of the defined types were placed in this encounter.     Procedures: No procedures performed   Clinical Data: No additional findings.   Subjective: Chief Complaint  Patient presents with  . Lower Back - Pain    HPI  Review of Systems   Objective: Vital Signs: BP 129/80   Pulse 80   Ht 5\' 9"  (1.753 m)   Wt 205 lb (93 kg)   BMI 30.27 kg/m   Physical Exam  Ortho Exam  Specialty Comments:  No specialty comments available.  Imaging: No results found.   PMFS History: There are no active problems to display for this patient.  Past Medical History:  Diagnosis Date  . Hypertension     No family history on file.  Past Surgical History:  Procedure Laterality Date  . ANTERIOR CERVICAL DECOMP/DISCECTOMY FUSION N/A 10/05/2012   Procedure: ANTERIOR CERVICAL DECOMPRESSION/DISCECTOMY FUSION 1 LEVEL;  Surgeon: Venita Lickahari Brooks, MD;  Location: [facility] OR;  Service: Orthopedics;   Laterality: N/A;  TOTAL DISC REPLACEMENT VERSES ACDF C4-5  . KNEE ARTHROSCOPY     left  . KNEE ARTHROSCOPY     on left side again  . ROTATOR CUFF REPAIR     left  . ROTATOR CUFF REPAIR     2nd time around (after fall)   Social History   Occupational History  . Not on file.   Social History Main Topics  . Smoking status: Never Smoker  . Smokeless tobacco: Never Used  . Alcohol use No  . Drug use: No  . Sexual activity: Not on file

## 2017-02-25 NOTE — Patient Instructions (Signed)
Avoid bending, stooping and avoid lifting weights greater than 10 lbs. Avoid prolong standing and walking. Avoid frequent bending and stooping  No lifting greater than 10 lbs. May use ice or moist heat for pain. Weight loss is of benefit. Handicap license is approved. Dr. [place] BlasNewton's secretary/Assistant will call to arrange for facet block left L2-3,L3-4, L4-5 and L5-S1 steroid/marcaine injection then in two weeks consider injections of the right side

## 2017-03-10 ENCOUNTER — Ambulatory Visit (INDEPENDENT_AMBULATORY_CARE_PROVIDER_SITE_OTHER): Payer: Medicare Other | Admitting: Physical Medicine and Rehabilitation

## 2017-03-10 DIAGNOSIS — G894 Chronic pain syndrome: Secondary | ICD-10-CM

## 2017-03-10 DIAGNOSIS — G8929 Other chronic pain: Secondary | ICD-10-CM

## 2017-03-10 DIAGNOSIS — M545 Low back pain, unspecified: Secondary | ICD-10-CM

## 2017-03-10 DIAGNOSIS — M47816 Spondylosis without myelopathy or radiculopathy, lumbar region: Secondary | ICD-10-CM | POA: Diagnosis not present

## 2017-03-11 ENCOUNTER — Ambulatory Visit (INDEPENDENT_AMBULATORY_CARE_PROVIDER_SITE_OTHER): Payer: Medicaid Other

## 2017-03-11 MED ORDER — METHYLPREDNISOLONE ACETATE 80 MG/ML IJ SUSP
80.0000 mg | Freq: Once | INTRAMUSCULAR | Status: AC
Start: 2017-03-11 — End: 2017-03-11
  Administered 2017-03-11: 80 mg

## 2017-03-11 MED ORDER — LIDOCAINE HCL (PF) 1 % IJ SOLN
2.0000 mL | Freq: Once | INTRAMUSCULAR | Status: AC
  Administered 2017-03-11: 2 mL

## 2017-03-11 NOTE — Procedures (Signed)
Lumbar Facet Joint Intra-Articular Injection(s) with Fluoroscopic Guidance  Patient: Erik LimboJohn Cook      Date of Birth: 04/08/1961 MRN: [id number] PCP: Emilio AspenJacobucci, Nicola J, MD      Visit Date: 03/10/2017   Universal Protocol:    Date/Time: 03/10/2017  Consent Given By: the patient  Position: PRONE   Additional Comments: Vital signs were monitored before and after the procedure. Patient was prepped and draped in the usual sterile fashion. The correct patient, procedure, and site was verified.   Injection Procedure Details:  Procedure Site One Meds Administered:  Meds ordered this encounter  Medications  . lidocaine (PF) (XYLOCAINE) 1 % injection 2 mL  . methylPREDNISolone acetate (DEPO-MEDROL) injection 80 mg     Laterality: Left  Location/Site:  L3-L4 L4-L5 L5-S1  Needle size: 22 guage  Needle type: Spinal  Needle Placement: Articular  Findings:  -Contrast Used: 1 mL iohexol 180 mg iodine/mL   -Comments: Excellent flow of contrast producing a partial arthrogram.  Procedure Details: The fluoroscope beam is vertically oriented in AP, and the inferior recess is visualized beneath the lower pole of the inferior apophyseal process, which represents the target point for needle insertion. When direct visualization is difficult the target point is located at the medial projection of the vertebral pedicle. The region overlying each aforementioned target is locally anesthetized with a 1 to 2 ml. volume of 1% Lidocaine without Epinephrine.   The spinal needle was inserted into each of the above mentioned facet joints using biplanar fluoroscopic guidance. A 0.25 to 0.5 ml. volume of Isovue-250 was injected and a partial facet joint arthrogram was obtained. A single spot film was obtained of the resulting arthrogram.    One to 1.25 ml of the steroid/anesthetic solution was then injected into each of the facet joints noted above.   Additional Comments:  The patient tolerated the  procedure well Dressing: Band-Aid    Post-procedure details: Patient was observed during the procedure. Post-procedure instructions were reviewed.  Patient left the clinic in stable condition.

## 2017-03-11 NOTE — Progress Notes (Signed)
Erik Cook - 56 y.o. male MRN [id number]  Date of birth: [birth year]  Office Visit Note: Visit Date: 03/10/2017 PCP: Emilio Aspen, MD Referred by: Emilio Aspen, MD  Subjective: No chief complaint on file.  HPI: ** Please note that the Epic electronic medical record was down today during this patient's visit. The dates associated with the procedure and medication delivery are incorrect.    Mr. Erik Cook is a 56 year old gentleman followed by the Dr. Otelia Sergeant. He has essentially chronic pain syndrome with a multitude of somatic low back and hip and leg complaints and complaints of different aspects of the hip and legs. He has undergone electrodiagnostic study by myself which was normal. He has had prior epidural injections which were bilateral L4 transforaminal injections by another provider. This note is actually reviewed below. He is also fairly recent MRI of the lumbar spine showing disc herniation more foraminal at L3-4 with some level of arthritic change at L4-5. He does not have high-grade central stenosis. Dr. Otelia Sergeant requested diagnostic facet joint blocks at 4 levels on the left followed by the same 4 levels on the right. Unfortunately from a insurance perspective and standard of care were usually limited to 3 levels of facet joint blocks. I've elected to complete the left L3-4 and L4-5 and L5-S1 facet joint blocks diagnostically today. This is looking at his current low back pain as well as looking at the latest MRI imaging which shows minimal facet arthropathy at L2-3. If he doesn't get much relief with those facet joint blocks and I think he should continue with conservative care and medication tight management or possibly surgical procedure of Dr. Otelia Sergeant is looking at doing that. If he does get relief than likely would look at either bilateral blocks versus right sided. In order to do radiofrequency ablation we have to have double diagnostic medial branch blocks of all the facet  joints that are thought to be a pain generator. Typically if the patient's having bilateral low back pain we complete bilateral facet joint blocks medial branch blocks followed by another block to confirm that it did help but this can be followed by radiofrequency ablation if the patient has had more than 6 months of chronic axial pain without radicular pain and failing conservative care including physical therapy and/or chiropractic care.    ROS Otherwise per HPI.  Assessment & Plan: Visit Diagnoses:  1. Spondylosis without myelopathy or radiculopathy, lumbar region   2. Chronic left-sided low back pain without sciatica   3. Chronic pain syndrome     Plan: Findings:  Facet joint blocks of the left L3-4 and L4-5 and L5-S1 facet joints.    Meds & Orders:  Meds ordered this encounter  Medications  . lidocaine (PF) (XYLOCAINE) 1 % injection 2 mL  . methylPREDNISolone acetate (DEPO-MEDROL) injection 80 mg    Orders Placed This Encounter  Procedures  . Facet Injection  . XR C-ARM NO REPORT    Follow-up: Return for Dr. Otelia Sergeant.   Procedures: No procedures performed  Lumbar Facet Joint Intra-Articular Injection(s) with Fluoroscopic Guidance  Patient: Erik Cook      Date of Birth: Aug 15, 1960 MRN: [id number] PCP: Emilio Aspen, MD      Visit Date: 03/10/2017   Universal Protocol:    Date/Time: 03/10/2017  Consent Given By: the patient  Position: PRONE   Additional Comments: Vital signs were monitored before and after the procedure. Patient was prepped and draped in the usual sterile fashion. The correct  patient, procedure, and site was verified.   Injection Procedure Details:  Procedure Site One Meds Administered:  Meds ordered this encounter  Medications  . lidocaine (PF) (XYLOCAINE) 1 % injection 2 mL  . methylPREDNISolone acetate (DEPO-MEDROL) injection 80 mg     Laterality: Left  Location/Site:  L3-L4 L4-L5 L5-S1  Needle size: 22 guage  Needle  type: Spinal  Needle Placement: Articular  Findings:  -Contrast Used: 1 mL iohexol 180 mg iodine/mL   -Comments: Excellent flow of contrast producing a partial arthrogram.  Procedure Details: The fluoroscope beam is vertically oriented in AP, and the inferior recess is visualized beneath the lower pole of the inferior apophyseal process, which represents the target point for needle insertion. When direct visualization is difficult the target point is located at the medial projection of the vertebral pedicle. The region overlying each aforementioned target is locally anesthetized with a 1 to 2 ml. volume of 1% Lidocaine without Epinephrine.   The spinal needle was inserted into each of the above mentioned facet joints using biplanar fluoroscopic guidance. A 0.25 to 0.5 ml. volume of Isovue-250 was injected and a partial facet joint arthrogram was obtained. A single spot film was obtained of the resulting arthrogram.    One to 1.25 ml of the steroid/anesthetic solution was then injected into each of the facet joints noted above.   Additional Comments:  The patient tolerated the procedure well Dressing: Band-Aid    Post-procedure details: Patient was observed during the procedure. Post-procedure instructions were reviewed.  Patient left the clinic in stable condition.     Clinical History: Op Note - Janan HalterWilliams, Brandon Leroy, MD - 10/05/2016 11:49 AM EST Procedure:   1) Bilateral L4-L5 Transforaminal Epidural Steroid Injection 2) Fluoroscopy  Surgeon: Dr. Rickey BarbaraBrandon Williams  Assistant: Lawrence Marseilleshristopher Custer,MD  FINDINGS: 09/19/2016 Lumbar Spine MRI  Alignment: Trace anterolisthesis of L5 on S1. Vertebrae: No marrow signal abnormalities to suggest neoplasm. 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 Scattered in the ventricular compatible Schmorl's nodes. Conus: In normal position without imaging evidence for tethering. Normal  signal and contour. Degenerative changes: T11-T12: Only seen on the sagittal images. No significant focal abnormality. T12-L1: No significant focal abnormality. L1-L2: No significant focal abnormality. L2-L3: Focal irregularity of the inferior endplate of L2 compatible with Schmorl's node formation. Disc bulge and minimal facet hypertrophy contributes to mild bilateral foraminal stenosis. No significant central stenosis. L3-L4: Disc height and signal loss. Disc bulge with a superimposed right foraminal disc herniation and osteophytic ridging, and facet hypertrophy, contribute to advanced right foraminal stenosis. There is narrowing of the right lateral recess. Mild central stenosis. L4-L5: Disc bulge, foraminal osteophytic ridging, and facet hypertrophy contribute to lateral recess narrowing and mild bilateral foraminal stenosis. L5-S1: Disc bulge and facet hypertrophy contribute to mild bilateral foraminal stenosis. Right facet joint effusion. Upper sacrum/ilium: No significant focal abnormality.  He reports that he has never smoked. He has never used smokeless tobacco. No results for input(s): HGBA1C, LABURIC in the last 8760 hours.  Objective:  VS:  HT:    WT:   BMI:     BP:   HR: bpm  TEMP: ( )  RESP:  Physical Exam  Musculoskeletal:  Patient ambulates without aid with good distal strength.    Ortho Exam Imaging: No results found.  Past Medical/Family/Surgical/Social History: Medications & Allergies reviewed per EMR There are no active problems to display for this patient.  Past Medical History:  Diagnosis Date  .  Hypertension    No family history on file. Past Surgical History:  Procedure Laterality Date  . 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 KNEE  ARTHROSCOPY     left  . KNEE ARTHROSCOPY     on left side again  . ROTATOR CUFF REPAIR     left  . ROTATOR CUFF REPAIR     2nd time around (after fall)   Social History   Occupational History  . Not on file.   Social History Main Topics  . Smoking status: Never Smoker  . Smokeless tobacco: Never Used  . Alcohol use No  . Drug use: No  . Sexual activity: Not on file

## 2017-03-11 NOTE — Patient Instructions (Signed)

## 2017-03-11 NOTE — Progress Notes (Unsigned)
Fluoro Time: 30 sec MGy: 30.65

## 2017-03-12 NOTE — Telephone Encounter (Signed)
Study was normal. jen

## 2017-03-16 ENCOUNTER — Telehealth (INDEPENDENT_AMBULATORY_CARE_PROVIDER_SITE_OTHER): Payer: Self-pay

## 2017-03-16 NOTE — Telephone Encounter (Signed)
Patient would like to know if he has to scedhule another appt with JN or if he could just put the order in for the other inj? Per previous Note...Marland Kitchen.Marland Kitchen.Marland Kitchen.    "Dr. Harrison BlasNewton's secretary/Assistant will call to arrange for facet block left L2-3,L3-4, L4-5 and L5-S1 steroid/marcaine injection then in two weeks consider injections of the right side"    Patient wanted for Dr Otelia SergeantNitka to know he had a Good response with inj, relieved a lot of the problems with his feet still having back pain.   CB: [phone]   States he faxed a mileage form over and they need to fillit out and fax it back.

## 2017-03-18 NOTE — Telephone Encounter (Signed)
I called and lmovm for patient to call us back to schedule appt with Dr. Alvester MorinNewton for Rt side facet injections

## 2017-03-18 NOTE — Telephone Encounter (Signed)
If left side low back pain better but right still hurts then do same facets levels but on right. If the left side low back pain is no better then f/up [place]

## 2017-03-18 NOTE — Telephone Encounter (Signed)
Can you please advise on this? Looks like you did the left side on 03/10/17 and he has got good relief from that and he is wanting the right side done.  Looks like per Dr. Barbaraann FasterNitka's note that the right side could be considered two weeks after.  Please advise

## 2017-03-19 NOTE — Telephone Encounter (Signed)
Pt is scheduled for 03/31/17 @ 3:00 w/driver

## 2017-03-31 ENCOUNTER — Ambulatory Visit (INDEPENDENT_AMBULATORY_CARE_PROVIDER_SITE_OTHER): Payer: Medicaid Other

## 2017-03-31 ENCOUNTER — Ambulatory Visit (INDEPENDENT_AMBULATORY_CARE_PROVIDER_SITE_OTHER): Payer: Medicare Other | Admitting: Physical Medicine and Rehabilitation

## 2017-03-31 VITALS — BP 120/72 | HR 95

## 2017-03-31 DIAGNOSIS — M47816 Spondylosis without myelopathy or radiculopathy, lumbar region: Secondary | ICD-10-CM | POA: Diagnosis not present

## 2017-03-31 DIAGNOSIS — G894 Chronic pain syndrome: Secondary | ICD-10-CM

## 2017-03-31 MED ORDER — METHYLPREDNISOLONE ACETATE 80 MG/ML IJ SUSP
80.0000 mg | Freq: Once | INTRAMUSCULAR | Status: AC
  Administered 2017-03-31: 80 mg

## 2017-03-31 MED ORDER — LIDOCAINE HCL (PF) 1 % IJ SOLN
2.0000 mL | Freq: Once | INTRAMUSCULAR | Status: AC
  Administered 2017-03-31: 2 mL

## 2017-03-31 NOTE — Patient Instructions (Signed)

## 2017-03-31 NOTE — Progress Notes (Deleted)
Patient states he had around 40% relief with last injection on the left side. Pain is now across back. Right=left.  Pain down both both legs to feet. Burning into legs and says it feels like he is walking on hot coals.

## 2017-04-02 NOTE — Procedures (Signed)
Mr. Erik Cook is a 56 year old gentleman with pain complaints of severe pain in both legs down to the feet from the low back. He gets burning severely in the legs and says it feels like he is walking on hot coals. He is a patient of Dr. Otelia Cook as he requested diagnostic facet joint blocks on the left side and the patient did get 40% relief with injections at L3-4 and L4-5 and L5-S1. Unfortunately does not really meet the criteria for looking at radiofrequency ablatio We are are going to complete the right side today as he is having more right-sided pain than left. He'll follow up with Dr. Otelia Cook. MRI evidence does not show any nerve compression.   Lumbar Facet Joint Intra-Articular Injection(s) with Fluoroscopic Guidance  Patient: Erik Cook      Date of Birth: 08/29/1960 MRN: [id number] PCP: Erik Cook      Visit Date: 03/31/2017   Universal Protocol:    Date/Time: 03/31/2017  Consent Given By: the patient  Position: PRONE   Additional Comments: Vital signs were monitored before and after the procedure. Patient was prepped and draped in the usual sterile fashion. The correct patient, procedure, and site was verified.   Injection Procedure Details:  Procedure Site One Meds Administered:  Meds ordered this encounter  Medications  . lidocaine (PF) (XYLOCAINE) 1 % injection 2 mL  . methylPREDNISolone acetate (DEPO-MEDROL) injection 80 mg     Laterality: Right  Location/Site:  L3-L4 L4-L5 L5-S1  Needle size: 22 guage  Needle type: Spinal  Needle Placement: Articular  Findings:  -Contrast Used: 0.5 mL iohexol 180 mg iodine/mL   -Comments: Excellent flow of contrast producing a partial arthrogram.  Procedure Details: The fluoroscope beam is vertically oriented in AP, and the inferior recess is visualized beneath the lower pole of the inferior apophyseal process, which represents the target point for needle insertion. When direct visualization is difficult the  target point is located at the medial projection of the vertebral pedicle. The region overlying each aforementioned target is locally anesthetized with a 1 to 2 ml. volume of 1% Lidocaine without Epinephrine.   The spinal needle was inserted into each of the above mentioned facet joints using biplanar fluoroscopic guidance. A 0.25 to 0.5 ml. volume of Isovue-250 was injected and a partial facet joint arthrogram was obtained. A single spot film was obtained of the resulting arthrogram.    One to 1.25 ml of the steroid/anesthetic solution was then injected into each of the facet joints noted above.   Additional Comments:  The patient tolerated the procedure well Dressing: Band-Aid    Post-procedure details: Patient was observed during the procedure. Post-procedure instructions were reviewed.  Patient left the clinic in stable condition.

## 2017-05-19 ENCOUNTER — Encounter (INDEPENDENT_AMBULATORY_CARE_PROVIDER_SITE_OTHER): Payer: Self-pay | Admitting: Specialist

## 2017-05-19 ENCOUNTER — Ambulatory Visit (INDEPENDENT_AMBULATORY_CARE_PROVIDER_SITE_OTHER): Payer: Medicare HMO | Admitting: Specialist

## 2017-05-19 VITALS — BP 118/81 | HR 66 | Ht 69.0 in | Wt 205.0 lb

## 2017-05-19 DIAGNOSIS — Z981 Arthrodesis status: Secondary | ICD-10-CM

## 2017-05-19 DIAGNOSIS — M4726 Other spondylosis with radiculopathy, lumbar region: Secondary | ICD-10-CM | POA: Diagnosis not present

## 2017-05-19 DIAGNOSIS — M4722 Other spondylosis with radiculopathy, cervical region: Secondary | ICD-10-CM | POA: Diagnosis not present

## 2017-05-19 DIAGNOSIS — M48062 Spinal stenosis, lumbar region with neurogenic claudication: Secondary | ICD-10-CM | POA: Diagnosis not present

## 2017-05-19 DIAGNOSIS — M7918 Myalgia, other site: Secondary | ICD-10-CM

## 2017-05-19 NOTE — Progress Notes (Signed)
Office Visit Note   Patient: Erik Cook           Date of Birth: [birth year]           MRN: [id number] Visit Date: 05/19/2017              Requested by: Emilio Aspen, MD [address] [place], [place] [address] PCP: Emilio Aspen, MD   Assessment & Plan: Visit Diagnoses:  1. Other spondylosis with radiculopathy, lumbar region   2. Spinal stenosis of lumbar region with neurogenic claudication   3. Myofascial pain syndrome, cervical   4. Other spondylosis with radiculopathy, cervical region   He has myofascial pain and pain that is probably secondary to spondylosis of the lumbar and cervical spine and there is weakness clinically right quadriceps and right foot DF. Right L3 foramenal stenosis. A right L3 foramenal block or ESI would be of benefit. Presently he is going through some turmoil with medicaid and ssdi, apparently he is just over limit to qualify. I have recommended he contact SSDi and see if this can be rectified.  Plan:Avoid bending, stooping and avoid lifting weights greater than 10 lbs. Avoid prolong standing and walking. Avoid frequent bending and stooping  No lifting greater than 10 lbs. May use ice or moist heat for pain. Weight loss is of benefit. Handicap license is approved.   Follow-Up Instructions: No Follow-up on file.   Orders:  No orders of the defined types were placed in this encounter.  No orders of the defined types were placed in this encounter.     Procedures: No procedures performed   Clinical Data: No additional findings.   Subjective: Chief Complaint  Patient presents with  . Lower Back - Follow-up    Had L3-4, L4-5, L5-S1 facet injection with Dr. Alvester Morin on 03/31/17    56 year old male with recent facet block block first on the right facets most recently with some improvement, the first set was on the left side and did not help as much. He feels a popping sensation when leaning to wipe at the toilet. Pain is sharp and then  better and he is able to stand and get up. This AM at 3  AM he did some inversion table and now has an inversion table that Does improve the pain. Walking is limited due to burning of the feet. Not up and about running errands and not able to walk as fall. Has two dogs and is walking some with them. He is still experiencing neck pain posteriorly especially with extension of the neck with pain in the neck and shoulders. The pain in his back is into the right upper buttock and he notices a knot in the right upper buttock with associated pain with pressure on it and also popping is in this area. The cortisone shots helped. Got up the previous AM and he had electrical shock in the right back.    Review of Systems  Constitutional: Negative.   HENT: Negative.   Eyes: Negative.   Respiratory: Negative.   Cardiovascular: Negative.   Gastrointestinal: Negative.   Endocrine: Negative.   Genitourinary: Negative.   Musculoskeletal: Negative.   Skin: Negative.   Allergic/Immunologic: Negative.   Neurological: Negative.   Hematological: Negative.   Psychiatric/Behavioral: Negative.      Objective: Vital Signs: BP 118/81 (BP Location: Left Arm, Patient Position: Sitting)   Pulse 66   Ht 5\' 9"  (1.753 m)   Wt 205 lb (93 kg)  BMI 30.27 kg/m   Physical Exam  Constitutional: He is oriented to person, place, and time. He appears well-developed and well-nourished.  HENT:  Head: Normocephalic and atraumatic.  Eyes: Pupils are equal, round, and reactive to light. EOM are normal.  Neck: Normal range of motion. Neck supple.  Pulmonary/Chest: Effort normal and breath sounds normal.  Abdominal: Soft. Bowel sounds are normal.  Musculoskeletal: Normal range of motion.  Neurological: He is alert and oriented to person, place, and time.  Skin: Skin is warm and dry.  Psychiatric: He has a normal mood and affect. His behavior is normal. Judgment and thought content normal.    Back Exam   Tenderness    The patient is experiencing tenderness in the lumbar.  Range of Motion  Extension: normal  Flexion: 80  Lateral Bend Right: 60  Lateral Bend Left: 60  Rotation Right: 60  Rotation Left: 60   Muscle Strength  Right Quadriceps:  4/5  Left Quadriceps:  5/5  Right Hamstrings:  5/5  Left Hamstrings:  5/5   Tests  Straight leg raise right: negative Straight leg raise left: negative  Reflexes  Patellar: 0/4 Achilles: 2/4 Babinski's sign: normal   Other  Toe Walk: normal Heel Walk: normal Sensation: normal Gait: normal  Erythema: no back redness Scars: absent  Comments:  Cervical spine with tenderness midline C3-T1, lateral masses are not uncomfortable, pain with spurling sign. Pain doesn't stop and he has to stop driving intermittantly due to the neck  Pain. Motor in the arms is normal. He does have some weakness right quad and right foot DF both 4/5. No atrophy.       Specialty Comments:  No specialty comments available.  Imaging: No results found.   PMFS History: There are no active problems to display for this patient.  Past Medical History:  Diagnosis Date  . Hypertension     No family history on file.  Past Surgical History:  Procedure Laterality Date  . 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 KNEE ARTHROSCOPY     left  . KNEE ARTHROSCOPY     on left side again  . ROTATOR CUFF REPAIR     left  . ROTATOR CUFF REPAIR     2nd time around (after fall)   Social History   Occupational History  . Not on file.   Social History Main Topics  . Smoking status: Never Smoker  . Smokeless tobacco: Never Used  . Alcohol use No  . Drug use: No  . Sexual activity: Not on file

## 2017-05-19 NOTE — Patient Instructions (Signed)
Avoid bending, stooping and avoid lifting weights greater than 10 lbs. Avoid prolong standing and walking. Avoid frequent bending and stooping  No lifting greater than 10 lbs. May use ice or moist heat for pain. Weight loss is of benefit. Handicap license is approved.  

## 2017-07-02 ENCOUNTER — Telehealth (INDEPENDENT_AMBULATORY_CARE_PROVIDER_SITE_OTHER): Payer: Self-pay | Admitting: Physical Medicine and Rehabilitation

## 2017-07-02 NOTE — Telephone Encounter (Signed)
Dr. Barbaraann FasterNitka's last note from October suggested that right L3 transforaminal injection may help.  I have no other notes.  The patient that the only specifics were that level of injection which would be a transforaminal epidural steroid injection.

## 2017-07-05 NOTE — Telephone Encounter (Signed)
Needs auth for [id number]. 

## 2017-07-22 ENCOUNTER — Encounter (INDEPENDENT_AMBULATORY_CARE_PROVIDER_SITE_OTHER): Payer: Self-pay | Admitting: Physical Medicine and Rehabilitation

## 2017-07-22 ENCOUNTER — Ambulatory Visit (INDEPENDENT_AMBULATORY_CARE_PROVIDER_SITE_OTHER): Payer: Medicare HMO | Admitting: Physical Medicine and Rehabilitation

## 2017-07-22 ENCOUNTER — Encounter (INDEPENDENT_AMBULATORY_CARE_PROVIDER_SITE_OTHER): Payer: Medicare HMO | Admitting: Physical Medicine and Rehabilitation

## 2017-07-22 ENCOUNTER — Ambulatory Visit (INDEPENDENT_AMBULATORY_CARE_PROVIDER_SITE_OTHER): Payer: Medicare Other

## 2017-07-22 VITALS — BP 124/82 | HR 86 | Temp 97.6°F

## 2017-07-22 DIAGNOSIS — M5116 Intervertebral disc disorders with radiculopathy, lumbar region: Secondary | ICD-10-CM

## 2017-07-22 DIAGNOSIS — M5416 Radiculopathy, lumbar region: Secondary | ICD-10-CM

## 2017-07-22 MED ORDER — BETAMETHASONE SOD PHOS & ACET 6 (3-3) MG/ML IJ SUSP
12.0000 mg | Freq: Once | INTRAMUSCULAR | Status: AC
  Administered 2017-07-22: 12 mg

## 2017-07-22 NOTE — Progress Notes (Deleted)
Pt states pain in lower back, pt states weakness in both knees from lower back pain. +Driver, -BT, -Dye Allergy.

## 2017-07-22 NOTE — Patient Instructions (Signed)

## 2017-08-11 NOTE — Procedures (Signed)
Mr. Erik Cook is a 57 year old gentleman that we have seen in the past for facet joint block through Dr. Otelia SergeantNitka.  He comes in today at the request of Dr. Otelia SergeantNitka for diagnostic right L3 transforaminal epidural steroid injection.  The injection  will be diagnostic and hopefully therapeutic. The patient has failed conservative care including time, medications and activity modification.  Lumbosacral Transforaminal Epidural Steroid Injection - Sub-Pedicular Approach with Fluoroscopic Guidance  Patient: Erik Cook      Date of Birth: 07/16/1961 MRN: [id number] PCP: Erik Cook, Erik Cook, Erik Cook      Visit Date: 07/22/2017   Universal Protocol:    Date/Time: 07/22/2017  Consent Given By: the patient  Position: PRONE  Additional Comments: Vital signs were monitored before and after the procedure. Patient was prepped and draped in the usual sterile fashion. The correct patient, procedure, and site was verified.   Injection Procedure Details:  Procedure Site One Meds Administered:  Meds ordered this encounter  Medications  . betamethasone acetate-betamethasone sodium phosphate (CELESTONE) injection 12 mg    Laterality: Right  Location/Site:  L3-L4  Needle size: 22 G  Needle type: Spinal  Needle Placement: Transforaminal  Findings:    -Comments: Excellent flow of contrast along the nerve and into the epidural space.  Procedure Details: After squaring off the end-plates to get a true AP view, the C-arm was positioned so that an oblique view of the foramen as noted above was visualized. The target area is just inferior to the "nose of the scotty dog" or sub pedicular. The soft tissues overlying this structure were infiltrated with 2-3 ml. of 1% Lidocaine without Epinephrine.  The spinal needle was inserted toward the target using a "trajectory" view along the fluoroscope beam.  Under AP and lateral visualization, the needle was advanced so it did not puncture dura and was located close  the 6 O'Clock position of the pedical in AP tracterory. Biplanar projections were used to confirm position. Aspiration was confirmed to be negative for CSF and/or blood. A 1-2 ml. volume of Isovue-250 was injected and flow of contrast was noted at each level. Radiographs were obtained for documentation purposes.   After attaining the desired flow of contrast documented above, a 0.5 to 1.0 ml test dose of 0.25% Marcaine was injected into each respective transforaminal space.  The patient was observed for 90 seconds post injection.  After no sensory deficits were reported, and normal lower extremity motor function was noted,   the above injectate was administered so that equal amounts of the injectate were placed at each foramen (level) into the transforaminal epidural space.   Additional Comments:  The patient tolerated the procedure well Dressing: Band-Aid    Post-procedure details: Patient was observed during the procedure. Post-procedure instructions were reviewed.  Patient left the clinic in stable condition.   Pertinent Imaging: Op Note - Erik Cook, Erik Cook, Erik Cook - 10/05/2016 11:49 AM EST Procedure:   1) Bilateral L4-L5 Transforaminal Epidural Steroid Injection 2) Fluoroscopy  Surgeon: Dr. Rickey BarbaraBrandon Williams  Assistant: Erik Cook,Erik Cook  FINDINGS: 09/19/2016 Lumbar Spine MRI  Alignment: Trace anterolisthesis of L5 on S1. Vertebrae: No marrow signal abnormalities to suggest neoplasm. 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 Scattered in the ventricular compatible Schmorl's nodes. Conus: In normal position without imaging evidence for tethering. Normal signal and contour. Degenerative changes: T11-T12: Only seen on the sagittal images. No significant focal abnormality. T12-L1: No significant focal abnormality. L1-L2: No significant focal abnormality. L2-L3:  Focal irregularity of the inferior endplate of L2  compatible with Schmorl's node formation. Disc bulge and minimal facet hypertrophy contributes to mild bilateral foraminal stenosis. No significant central stenosis. L3-L4: Disc height and signal loss. Disc bulge with a superimposed right foraminal disc herniation and osteophytic ridging, and facet hypertrophy, contribute to advanced right foraminal stenosis. There is narrowing of the right lateral recess. Mild central stenosis. L4-L5: Disc bulge, foraminal osteophytic ridging, and facet hypertrophy contribute to lateral recess narrowing and mild bilateral foraminal stenosis. L5-S1: Disc bulge and facet hypertrophy contribute to mild bilateral foraminal stenosis. Right facet joint effusion. Upper sacrum/ilium: No significant focal abnormality.

## 2017-08-17 ENCOUNTER — Ambulatory Visit (INDEPENDENT_AMBULATORY_CARE_PROVIDER_SITE_OTHER): Payer: Medicare HMO | Admitting: Specialist

## 2017-08-17 ENCOUNTER — Encounter (INDEPENDENT_AMBULATORY_CARE_PROVIDER_SITE_OTHER): Payer: Self-pay | Admitting: Specialist

## 2017-08-17 VITALS — BP 128/81 | HR 76 | Ht 69.0 in | Wt 205.0 lb

## 2017-08-17 DIAGNOSIS — M4722 Other spondylosis with radiculopathy, cervical region: Secondary | ICD-10-CM | POA: Diagnosis not present

## 2017-08-17 DIAGNOSIS — M48062 Spinal stenosis, lumbar region with neurogenic claudication: Secondary | ICD-10-CM

## 2017-08-17 NOTE — Patient Instructions (Signed)
Avoid overhead lifting and overhead use of the arms. Do not lift greater than 5-10 lbs. Adjust head rest in vehicle to prevent hyperextension if rear ended. Take extra precautions to avoid falling.Avoid bending, stooping and avoid lifting weights greater than 10 lbs.  Handicap license is approved. Dr. Rogers BlasNewton's secretary/Assistant will call to arrange for epidural steroid injection

## 2017-08-17 NOTE — Progress Notes (Signed)
Office Visit Note   Patient: Erik LimboJohn Cook           Date of Birth: 02/26/1961           MRN: [id number] Visit Date: 08/17/2017              Requested by: Erik Cook, Erik J, MD [address] [place], [place] [id number] PCP: Erik Cook   Assessment & Plan: Visit Diagnoses:  1. Spinal stenosis of lumbar region with neurogenic claudication   2. Other spondylosis with radiculopathy, cervical region     Plan: Avoid overhead lifting and overhead use of the arms. Do not lift greater than 5-10 lbs. Adjust head rest in vehicle to prevent hyperextension if rear ended. Take extra precautions to avoid falling.Avoid bending, stooping and avoid lifting weights greater than 10 lbs.  Handicap license is approved. Erik Cook's secretary/Assistant will call to arrange for epidural steroid injection   Follow-Up Instructions: Return in about 4 weeks (around 09/14/2017).   Orders:  No orders of the defined types were placed in this encounter.  No orders of the defined types were placed in this encounter.     Procedures: No procedures performed   Clinical Data: No additional findings.   Subjective: Chief Complaint  Patient presents with  . Lower Back - Follow-up    He had right L3-4 Transforminal injection with Erik Cook on 07/22/2017 and it has reallyu helped him.       57 year old male with history of cervical and lumbar pain with recent ESI right L3-4 TF seems to be feeling better post injection. But following the lumbar ESI he has had onset of pressure sensation in the neck. Starts at the base of the skull posteriorly and then radiates downwards. He took 2 mobics to decrease the pain. One mobic and able to go to bed. He feels like electricity in the left neck and left arm. He has grown used to the lef arm. Right now concerned about left arm numbness with driving. The pressure is a worsening discomfort into the left posterior neck. Numbness and tingling still present.       Review of Systems  Constitutional: Negative.   HENT: Negative.   Eyes: Negative.   Respiratory: Negative.   Cardiovascular: Negative.   Gastrointestinal: Negative.   Endocrine: Negative.   Genitourinary: Negative.   Musculoskeletal: Negative.   Skin: Negative.   Allergic/Immunologic: Negative.   Neurological: Negative.   Hematological: Negative.   Psychiatric/Behavioral: Negative.      Objective: Vital Signs: BP 128/81 (BP Location: Left Arm, Patient Position: Sitting)   Pulse 76   Ht 5\' 9"  (1.753 m)   Wt 205 lb (93 kg)   BMI 30.27 kg/m   Physical Exam  Constitutional: He is oriented to person, place, and time. He appears well-developed and well-nourished.  HENT:  Head: Normocephalic and atraumatic.  Eyes: EOM are normal. Pupils are equal, round, and reactive to light.  Neck: Normal range of motion. Neck supple.  Pulmonary/Chest: Effort normal and breath sounds normal.  Abdominal: Soft. Bowel sounds are normal.  Neurological: He is alert and oriented to person, place, and time.  Skin: Skin is warm and dry.  Psychiatric: He has a normal mood and affect. His behavior is normal. Judgment and thought content normal.    Back Exam   Tenderness  The patient is experiencing tenderness in the cervical.  Range of Motion  Extension:  80 abnormal  Flexion:  70 abnormal  Lateral  bend right: abnormal  Lateral bend left: abnormal  Rotation right: abnormal  Rotation left: abnormal   Muscle Strength  Right Quadriceps:  5/5  Left Quadriceps:  5/5  Right Hamstrings:  5/5   Tests  Straight leg raise right: negative Straight leg raise left: negative  Reflexes  Patellar: normal Achilles: normal Biceps: normal Babinski's sign: normal   Other  Toe walk: normal Heel walk: normal Sensation: normal Gait: normal  Erythema: no back redness Scars: absent  Comments:  Left posteror strap tenderness.      Specialty Comments:  No specialty comments  available.  Imaging: No results found.   PMFS History: There are no active problems to display for this patient.  Past Medical History:  Diagnosis Date  . Hypertension     History reviewed. No pertinent family history.  Past Surgical History:  Procedure Laterality Date  . 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 KNEE ARTHROSCOPY     left  . KNEE ARTHROSCOPY     on left side again  . ROTATOR CUFF REPAIR     left  . ROTATOR CUFF REPAIR     2nd time around (after fall)   Social History   Occupational History  . Not on file  Tobacco Use  . Smoking status: Never Smoker  . Smokeless tobacco: Never Used  Substance and Sexual Activity  . Alcohol use: No  . Drug use: No  . Sexual activity: Not on file

## 2017-08-19 ENCOUNTER — Ambulatory Visit (INDEPENDENT_AMBULATORY_CARE_PROVIDER_SITE_OTHER): Payer: Medicare HMO | Admitting: Specialist

## 2017-09-02 ENCOUNTER — Ambulatory Visit (INDEPENDENT_AMBULATORY_CARE_PROVIDER_SITE_OTHER): Payer: Medicare HMO | Admitting: Physical Medicine and Rehabilitation

## 2017-09-02 ENCOUNTER — Encounter (INDEPENDENT_AMBULATORY_CARE_PROVIDER_SITE_OTHER): Payer: Self-pay | Admitting: Physical Medicine and Rehabilitation

## 2017-09-02 ENCOUNTER — Ambulatory Visit (INDEPENDENT_AMBULATORY_CARE_PROVIDER_SITE_OTHER): Payer: Medicare HMO

## 2017-09-02 VITALS — BP 131/89 | HR 77

## 2017-09-02 DIAGNOSIS — M5416 Radiculopathy, lumbar region: Secondary | ICD-10-CM | POA: Diagnosis not present

## 2017-09-02 DIAGNOSIS — M5010 Cervical disc disorder with radiculopathy, unspecified cervical region: Secondary | ICD-10-CM

## 2017-09-02 MED ORDER — METHYLPREDNISOLONE ACETATE 80 MG/ML IJ SUSP
80.0000 mg | Freq: Once | INTRAMUSCULAR | Status: AC
  Administered 2017-09-02: 80 mg

## 2017-09-02 NOTE — Progress Notes (Deleted)
Pt states a constant pain in neck. Pt states pain has been going on for about 6 years and has been worse after last injection 07/22/17. Pt also states that last injection for his back helped a lot. Pt states waking up its worse, hot tubs and hot showers help ease the pain. +Driver, -BT, -Dye Allergies.

## 2017-09-02 NOTE — Patient Instructions (Signed)

## 2017-09-03 NOTE — Procedures (Signed)
Mr. Erik Cook is a 57 year old gentleman who comes in today at the request of Dr. Otelia SergeantNitka for left C7-T1 interlaminar epidural steroid injection.  The patient is status post ACDF at C4-5 with MRI from earlier in 2018 showing C5-6 disc osteophyte complex with really more right than left-sided pathology.  He gets more left-sided arm complaints but bilateral neck pain.  Interestingly we saw him in December and completed lumbar transforaminal epidural steroid injection with actually very good relief of his back pain but unfortunately he felt like his neck pain increased after that.  The injection  will be diagnostic and hopefully therapeutic. The patient has failed conservative care including time, medications and activity modification. Cervical Epidural Steroid Injection - Interlaminar Approach with Fluoroscopic Guidance  Patient: Erik LimboJohn Cook      Date of Birth: 03/12/1961 MRN: [id number] PCP: Patient, No Pcp Per      Visit Date: 09/02/2017   Universal Protocol:    Date/Time: 02/01/196:03 AM  Consent Given By: the patient  Position: PRONE  Additional Comments: Vital signs were monitored before and after the procedure. Patient was prepped and draped in the usual sterile fashion. The correct patient, procedure, and site was verified.   Injection Procedure Details:  Procedure Site One Meds Administered:  Meds ordered this encounter  Medications  . methylPREDNISolone acetate (DEPO-MEDROL) injection 80 mg     Laterality: Left  Location/Site:  C7-T1  Needle size: 20 G  Needle type: Touhy  Needle Placement: Paramedian epidural space  Findings:  -Comments: Excellent flow of contrast into the epidural space.  Procedure Details: Using a paramedian approach from the side mentioned above, the region overlying the inferior lamina was localized under fluoroscopic visualization and the soft tissues overlying this structure were infiltrated with 4 ml. of 1% Lidocaine without Epinephrine. A #  20 gauge, Tuohy needle was inserted into the epidural space using a paramedian approach.  The epidural space was localized using loss of resistance along with lateral and contralateral oblique bi-planar fluoroscopic views.  After negative aspirate for air, blood, and CSF, a 2 ml. volume of Isovue-250 was injected into the epidural space and the flow of contrast was observed. Radiographs were obtained for documentation purposes.   The injectate was administered into the level noted above.  Additional Comments:  The patient tolerated the procedure well Dressing: Band-Aid    Post-procedure details: Patient was observed during the procedure. Post-procedure instructions were reviewed.  Patient left the clinic in stable condition.   Pertinent Imaging: MRI CERVICAL SPINE WITHOUT CONTRAST, 09/19/2016 2:56 PM  Impression:  1.ACDF C4-C5 which is new in comparison to 2013. 2.Multilevel degenerative changes with progressive adjacent level disease at C5-C6 where there is mild central stenosis, and moderate right and mild left foraminal stenosis. Result Narrative  Anterior cervical discectomy and fusion C4-C5 which is new in comparison to 2013. Spinal cord: Normal signal and contour. Cervicocranial junction: No significant focal abnormality. C1-C2: No significant focal abnormality. C2-C3: Small posterior disc osteophyte complex. No significant central or foraminal stenosis. C3-C4: Small posterior disc osteophyte complex and uncovertebral joint hypertrophy. Mild left foraminal stenosis similar to prior. C4-C5: ACDF. No significant central or foraminal stenosis C5-C6: Posterior disc osteophyte complex, right greater than left uncovertebral joint hypertrophy, and minimal facet hypertrophy contributes to mild central, moderate right and mild left foraminal stenosis. Disease is progressed at this level in comparison to 2013 C6-C7: Left greater than right uncovertebral joint hypertrophy  contributes to mild left foraminal stenosis. C7-T1: No significant focal abnormality. Upper Thoracic  Spine: No significant focal abnormality.    Op Note - Janan Halter, MD - 10/05/2016 11:49 AM EST Procedure:   1) Bilateral L4-L5 Transforaminal Epidural Steroid Injection 2) Fluoroscopy  Surgeon: Dr. Rickey Barbara  Assistant: Lawrence Marseilles  FINDINGS: 09/19/2016 Lumbar Spine MRI  Alignment: Trace anterolisthesis of L5 on S1. Vertebrae: No marrow signal abnormalities to suggest neoplasm. 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 Scattered in the ventricular compatible Schmorl's nodes. Conus: In normal position without imaging evidence for tethering. Normal signal and contour. Degenerative changes: T11-T12: Only seen on the sagittal images. No significant focal abnormality. T12-L1: No significant focal abnormality. L1-L2: No significant focal abnormality. L2-L3: Focal irregularity of the inferior endplate of L2 compatible with Schmorl's node formation. Disc bulge and minimal facet hypertrophy contributes to mild bilateral foraminal stenosis. No significant central stenosis. L3-L4: Disc height and signal loss. Disc bulge with a superimposed right foraminal disc herniation and osteophytic ridging, and facet hypertrophy, contribute to advanced right foraminal stenosis. There is narrowing of the right lateral recess. Mild central stenosis. L4-L5: Disc bulge, foraminal osteophytic ridging, and facet hypertrophy contribute to lateral recess narrowing and mild bilateral foraminal stenosis. L5-S1: Disc bulge and facet hypertrophy contribute to mild bilateral foraminal stenosis. Right facet joint effusion. Upper sacrum/ilium: No significant focal abnormality.

## 2017-09-30 ENCOUNTER — Encounter (INDEPENDENT_AMBULATORY_CARE_PROVIDER_SITE_OTHER): Payer: Self-pay | Admitting: Specialist

## 2017-09-30 ENCOUNTER — Ambulatory Visit (INDEPENDENT_AMBULATORY_CARE_PROVIDER_SITE_OTHER): Payer: Medicare HMO | Admitting: Specialist

## 2017-09-30 VITALS — BP 131/91 | HR 66 | Ht 69.0 in | Wt 200.0 lb

## 2017-09-30 DIAGNOSIS — S76212A Strain of adductor muscle, fascia and tendon of left thigh, initial encounter: Secondary | ICD-10-CM | POA: Diagnosis not present

## 2017-09-30 DIAGNOSIS — M5136 Other intervertebral disc degeneration, lumbar region: Secondary | ICD-10-CM

## 2017-09-30 DIAGNOSIS — M5481 Occipital neuralgia: Secondary | ICD-10-CM | POA: Diagnosis not present

## 2017-09-30 DIAGNOSIS — M48062 Spinal stenosis, lumbar region with neurogenic claudication: Secondary | ICD-10-CM

## 2017-09-30 DIAGNOSIS — M47812 Spondylosis without myelopathy or radiculopathy, cervical region: Secondary | ICD-10-CM

## 2017-09-30 MED ORDER — MELOXICAM 15 MG PO TABS: 15.0000 mg | ORAL_TABLET | Freq: Every day | ORAL | 6 refills | Status: DC

## 2017-09-30 MED ORDER — GABAPENTIN 300 MG PO CAPS: 300.0000 mg | ORAL_CAPSULE | Freq: Three times a day (TID) | ORAL | 3 refills | Status: DC

## 2017-09-30 NOTE — Addendum Note (Signed)
Addended by: Vira BrownsNITKA, Chisum Habenicht on: 09/30/2017 12:35 PM   Modules accepted: Orders

## 2017-09-30 NOTE — Progress Notes (Signed)
Office Visit Note   Patient: Erik LimboJohn Cook           Date of Birth: 06/06/1961           MRN: [id number] Visit Date: 09/30/2017              Requested by: No referring provider defined for this encounter. PCP: Erik Cook   Assessment & Plan: Visit Diagnoses:  1. Spondylosis without myelopathy or radiculopathy, cervical region   2. Spinal stenosis of lumbar region with neurogenic claudication   3. Bilateral occipital neuralgia   4. Strain of adductor muscle, fascia and tendon of left thigh, initial encounter     Plan:Avoid overhead lifting and overhead use of the arms. Do not lift greater than 5 lbs. Adjust head rest in vehicle to prevent hyperextension if rear ended. Take extra precautions to avoid falling. Avoid bending, stooping and avoid lifting weights greater than 10 lbs. Avoid prolong standing and walking. Avoid frequent bending and stooping  No lifting greater than 10 lbs. May use ice or moist heat for pain. Weight loss is of benefit. Handicap license is approved. Avoid squatting and stressing the inner thigh muscle on the left fro another 2-4 weeks then stretching exercises and strengthening may help. May want to consider evaluation by neurology for headaches if they persists and a Major source of pain.   Follow-Up Instructions: Return in about 6 weeks (around 11/11/2017).   Orders:  No orders of the defined types were placed in this encounter.  No orders of the defined types were placed in this encounter.     Procedures: No procedures performed   Clinical Data: No additional findings.   Subjective: Chief Complaint  Patient presents with  . Neck - Pain    57 year old male with neck pain with pain that is hard to get to subside, pain was better for 2 weeks straight. He has return of the pain intermittantly but some better. Standing after being in the crawl space this past couple of weeks ago with pain in the left groin. Felt a tear in the left  groin. Has had some improvement in the left leg groin pain. He has had headaches that were very frequent prior to the recent ESI by Dr. Alvester MorinNewton and now less but are still associated with quite a bit of pain and he has to slow down, he gets sick to his stomach with the neck and headaches. The pain is so bad he has considered narcotics but does not want to start. The TENS unit does help some.     Review of Systems  Constitutional: Negative.   HENT: Negative.   Eyes: Negative.   Respiratory: Negative.   Cardiovascular: Negative.   Gastrointestinal: Negative.   Endocrine: Negative.   Genitourinary: Negative.   Musculoskeletal: Negative.   Skin: Negative.   Allergic/Immunologic: Negative.   Neurological: Negative.   Hematological: Negative.   Psychiatric/Behavioral: Negative.      Objective: Vital Signs: BP (!) 131/91   Pulse 66   Ht 5\' 9"  (1.753 m)   Wt 200 lb (90.7 kg)   BMI 29.53 kg/m   Physical Exam  Constitutional: He is oriented to person, place, and time. He appears well-developed and well-nourished.  HENT:  Head: Normocephalic and atraumatic.  Eyes: EOM are normal. Pupils are equal, round, and reactive to light.  Neck: Normal range of motion. Neck supple.  Pulmonary/Chest: Effort normal and breath sounds normal.  Abdominal: Soft. Bowel sounds are normal.  Neurological: He is alert and oriented to person, place, and time.  Skin: Skin is warm and dry.  Psychiatric: He has a normal mood and affect. His behavior is normal. Judgment and thought content normal.    Back Exam   Tenderness  The patient is experiencing tenderness in the cervical and lumbar.  Range of Motion  Extension: abnormal  Flexion: abnormal  Lateral bend right: abnormal  Lateral bend left: abnormal  Rotation right: abnormal  Rotation left: abnormal   Muscle Strength  Right Quadriceps:  5/5  Left Quadriceps:  5/5  Right Hamstrings:  5/5  Left Hamstrings:  5/5   Tests  Straight leg raise  right: negative Straight leg raise left: negative  Reflexes  Patellar: normal Achilles: normal Biceps: normal Babinski's sign: normal   Other  Toe walk: normal Heel walk: normal Sensation: normal Erythema: no back redness Scars: absent  Comments:  Tender bilateral cervicooccipital junction. Motor is normal  Legs with norma motor except for ER of the left hip and adduction against resistance with left groin pain. Hip flexion is not painful left side.        Specialty Comments:  No specialty comments available.  Imaging: No results found.   PMFS History: There are no active problems to display for this patient.  Past Medical History:  Diagnosis Date  . Hypertension     History reviewed. No pertinent family history.  Past Surgical History:  Procedure Laterality Date  . 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 KNEE ARTHROSCOPY     left  . KNEE ARTHROSCOPY     on left side again  . ROTATOR CUFF REPAIR     left  . ROTATOR CUFF REPAIR     2nd time around (after fall)   Social History   Occupational History  . Not on file  Tobacco Use  . Smoking status: Never Smoker  . Smokeless tobacco: Never Used  Substance and Sexual Activity  . Alcohol use: No  . Drug use: No  . Sexual activity: Not on file

## 2017-09-30 NOTE — Addendum Note (Signed)
Addended by: Cherre HugerMAY, Jerid Catherman E on: 09/30/2017 03:03 PM   Modules accepted: Orders

## 2017-09-30 NOTE — Patient Instructions (Signed)
Avoid overhead lifting and overhead use of the arms. Do not lift greater than 5 lbs. Adjust head rest in vehicle to prevent hyperextension if rear ended. Take extra precautions to avoid falling. Avoid bending, stooping and avoid lifting weights greater than 10 lbs. Avoid prolong standing and walking. Avoid frequent bending and stooping  No lifting greater than 10 lbs. May use ice or moist heat for pain. Weight loss is of benefit. Handicap license is approved. Avoid squatting and stressing the inner thigh muscle on the left fro another 2-4 weeks then stretching exercises and strengthening may help. May want to consider evaluation by neurology for headaches if they persists and a Major source of pain.

## 2017-12-30 ENCOUNTER — Ambulatory Visit (INDEPENDENT_AMBULATORY_CARE_PROVIDER_SITE_OTHER): Payer: Medicare HMO

## 2017-12-30 ENCOUNTER — Ambulatory Visit (INDEPENDENT_AMBULATORY_CARE_PROVIDER_SITE_OTHER): Payer: Medicare HMO | Admitting: Specialist

## 2017-12-30 ENCOUNTER — Encounter (INDEPENDENT_AMBULATORY_CARE_PROVIDER_SITE_OTHER): Payer: Self-pay | Admitting: Specialist

## 2017-12-30 VITALS — BP 137/87 | HR 67 | Ht 69.0 in | Wt 200.0 lb

## 2017-12-30 DIAGNOSIS — M47812 Spondylosis without myelopathy or radiculopathy, cervical region: Secondary | ICD-10-CM

## 2017-12-30 DIAGNOSIS — M5030 Other cervical disc degeneration, unspecified cervical region: Secondary | ICD-10-CM

## 2017-12-30 DIAGNOSIS — M4815 Ankylosing hyperostosis [Forestier], thoracolumbar region: Secondary | ICD-10-CM | POA: Diagnosis not present

## 2017-12-30 DIAGNOSIS — M4316 Spondylolisthesis, lumbar region: Secondary | ICD-10-CM

## 2017-12-30 DIAGNOSIS — M47816 Spondylosis without myelopathy or radiculopathy, lumbar region: Secondary | ICD-10-CM

## 2017-12-30 DIAGNOSIS — M5136 Other intervertebral disc degeneration, lumbar region: Secondary | ICD-10-CM | POA: Diagnosis not present

## 2017-12-30 NOTE — Progress Notes (Signed)
Office Visit Note   Patient: Erik Cook           Date of Birth: [birth year]           MRN: [id number] Visit Date: 12/30/2017              Requested by: No referring provider defined for this encounter. PCP: Patient, No Pcp Per   Assessment & Plan: Visit Diagnoses:  1. Degenerative disc disease, lumbar   2. Spondylosis without myelopathy or radiculopathy, cervical region   3. Spondylosis without myelopathy or radiculopathy, lumbar region   4. Spondylolisthesis, lumbar region   5. Forestier's disease of thoracolumbar region   6. Degenerative disc disease, cervical     Plan:Avoid overhead lifting and overhead use of the arms. Do not lift greater than 5 lbs. Adjust head rest in vehicle to prevent hyperextension if rear ended. Take extra precautions to avoid falling.  Avoid bending, stooping and avoid lifting weights greater than 10 lbs. Avoid prolong standing and walking. Avoid frequent bending and stooping  No lifting greater than 10 lbs. May use ice or moist heat for pain. Weight loss is of benefit. Handicap license is approved.  Follow-Up Instructions: No follow-ups on file.   Orders:  Orders Placed This Encounter  Procedures  . XR Lumbar Spine 2-3 Views  . XR Cervical Spine 2 or 3 views   No orders of the defined types were placed in this encounter.     Procedures: No procedures performed   Clinical Data: No additional findings.   Subjective: Chief Complaint  Patient presents with  . Neck - Follow-up  . Lower Back - Follow-up    57 year old male seen with problems that are chronic and he reports pain that is worse with stretching in a hot tub the pain worsened into the back. He reports that he uses a hot tub twice daily when he gets up and also when he gets ready to go to bed. He is not able to function without the hot tub, he reports pressure in his knees with not using the tub. It helps get rid of pressure. Further xrays were done. He reports that  he is in financial difficutly. Feels like the neck is in a vice. I'll need to take a tylenol to drive home today.   Review of Systems  Constitutional: Negative.   HENT: Negative.   Eyes: Negative.   Respiratory: Negative.   Cardiovascular: Negative.   Gastrointestinal: Negative.   Endocrine: Negative.   Genitourinary: Negative.   Musculoskeletal: Negative.   Skin: Negative.   Allergic/Immunologic: Negative.   Neurological: Negative.   Hematological: Negative.   Psychiatric/Behavioral: Negative.      Objective: Vital Signs: BP 137/87   Pulse 67   Ht  (1.753 m)   Wt 200 lb (90.7 kg)   BMI 29.53 kg/m   Physical Exam  Constitutional: He is oriented to person, place, and time. He appears well-developed and well-nourished.  HENT:  Head: Normocephalic and atraumatic.  Eyes: Pupils are equal, round, and reactive to light. EOM are normal.  Neck: Normal range of motion. Neck supple.  Pulmonary/Chest: Effort normal and breath sounds normal.  Abdominal: Soft. Bowel sounds are normal.  Musculoskeletal:       Right knee: He exhibits no effusion.       Left knee: He exhibits no effusion.  Neurological: He is alert and oriented to person, place, and time.  Skin: Skin is warm and dry.  Psychiatric: He  has a normal mood and affect. His behavior is normal. Judgment and thought content normal.    Right Knee Exam  Right knee exam is normal.  Range of Motion  Extension: normal  Flexion: normal   Tests  McMurray:  Medial - negative Lateral - negative Varus: negative Valgus: negative Lachman:  Anterior - negative    Posterior - negative Drawer:  Anterior - negative    Posterior - negative Pivot shift: negative Patellar apprehension: negative  Other  Erythema: absent Sensation: normal Pulse: present Swelling: none Effusion: no effusion present   Left Knee Exam  Left knee exam is normal.  Tenderness  The patient is experiencing tenderness in the medial joint line  and MCL.  Range of Motion  Extension: normal  Flexion: normal   Tests  McMurray:  Medial - negative Lateral - negative Varus: negative Valgus: negative Lachman:  Anterior - negative    Posterior - negative Drawer:  Anterior - negative     Posterior - negative Pivot shift: negative Patellar apprehension: negative  Other  Erythema: absent Scars: present Sensation: normal Pulse: present Swelling: none Effusion: no effusion present   Back Exam   Tenderness  The patient is experiencing tenderness in the lumbar and cervical.  Range of Motion  Extension: abnormal  Flexion: 70  Lateral bend right: abnormal  Lateral bend left: abnormal  Rotation right: abnormal  Rotation left: abnormal   Muscle Strength  The patient has normal back strength. Right Quadriceps:  5/5  Left Quadriceps:  5/5  Right Hamstrings:  5/5  Left Hamstrings:  5/5   Tests  Straight leg raise right: negative Straight leg raise left: negative  Reflexes  Patellar: normal Achilles: normal Biceps: normal Babinski's sign: normal   Other  Toe walk: normal Heel walk: normal Sensation: normal Gait: normal  Erythema: no back redness Scars: absent      Specialty Comments:  No specialty comments available.  Imaging: Xr Cervical Spine 2 Or 3 Views  Result Date: 12/30/2017 AP and lateral flexion and extension radiographs of the cervical spinal show Anterior plate and screws fixing a ACDF C4-5 with DDD C5-6 and C6-7, mild at C7-T1. No instability with flexion and extension radiographs.  Xr Lumbar Spine 2-3 Views  Result Date: 12/30/2017 AP and lateral flexion and extension radiographs show DDD L1-2, L2-3, L3-4, L4-5 and L5-S1. There is a grade 1 anterolisthesis L5-S1. SI joints are well maintained. Hips with  Minimal DJD superolateral hip narrowing with loss of lateral contour femoral neck and head bilaterally.     PMFS History: There are no active problems to display for this  patient.  Past Medical History:  Diagnosis Date  . Hypertension     History reviewed. No pertinent family history.  Past Surgical History:  Procedure Laterality Date  . 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 KNEE ARTHROSCOPY     left  . KNEE ARTHROSCOPY     on left side again  . ROTATOR CUFF REPAIR     left  . ROTATOR CUFF REPAIR     2nd time around (after fall)   Social History   Occupational History  . Not on file  Tobacco Use  . Smoking status: Never Smoker  . Smokeless tobacco: Never Used  Substance and Sexual Activity  . Alcohol use: No  .  Drug use: No  . Sexual activity: Not on file       

## 2017-12-30 NOTE — Patient Instructions (Addendum)
Avoid overhead lifting and overhead use of the arms. Do not lift greater than 5 lbs. Adjust head rest in vehicle to prevent hyperextension if rear ended. Take extra precautions to avoid falling.  Avoid bending, stooping and avoid lifting weights greater than 10 lbs. Avoid prolong standing and walking. Avoid frequent bending and stooping  No lifting greater than 10 lbs. May use ice or moist heat for pain. Weight loss is of benefit. Handicap license is approved.   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 Well padded shoes help. Ice the knee 2-3 times a day 15-20 mins at a time.

## 2018-03-15 ENCOUNTER — Telehealth (INDEPENDENT_AMBULATORY_CARE_PROVIDER_SITE_OTHER): Payer: Self-pay | Admitting: Physical Medicine and Rehabilitation

## 2018-03-15 NOTE — Telephone Encounter (Signed)
Patient left a message requesting an appointment. Left C7-T1 IL

## 2018-03-15 NOTE — Telephone Encounter (Signed)
Should run by Dr. Otelia SergeantNitka as he is one of his patients

## 2018-03-15 NOTE — Telephone Encounter (Signed)
See messages below  Please advise

## 2018-03-17 ENCOUNTER — Telehealth (INDEPENDENT_AMBULATORY_CARE_PROVIDER_SITE_OTHER): Payer: Self-pay | Admitting: Physical Medicine and Rehabilitation

## 2018-03-17 NOTE — Telephone Encounter (Signed)
Patient is wanting to have another injection with Dr. Alvester MorinNewton and wasn't sure if he needed another order put in from Dr. Otelia SergeantNitka. He is also wanting it the same day in the afternoon if possible as his appt with Nitka on 8/29. He said he lives an hour and a half away and it would be easier on him this way. Please advise

## 2018-03-17 NOTE — Telephone Encounter (Signed)
Patient is wanting to have another injection with Dr. Alvester MorinNewton and wasn't sure if he needed another order put in from Dr. Otelia SergeantNitka. He is also wanting it the same day in the afternoon if possible as his appt with Nitka on 8/29. He said he lives an hour and a half away and it would be easier on him this way. Please advise # [phone]

## 2018-03-25 NOTE — Telephone Encounter (Signed)
Patient left a message stating that he left a message 3 weeks ago in regards to getting an injection with Dr. Alvester MorinNewton.  He would like a call back ASAP.  CB#[phone].  Thank you.

## 2018-03-31 ENCOUNTER — Encounter (INDEPENDENT_AMBULATORY_CARE_PROVIDER_SITE_OTHER): Payer: Self-pay | Admitting: Specialist

## 2018-03-31 ENCOUNTER — Ambulatory Visit (INDEPENDENT_AMBULATORY_CARE_PROVIDER_SITE_OTHER): Payer: Medicare HMO | Admitting: Specialist

## 2018-03-31 VITALS — BP 121/82 | HR 58 | Ht 69.0 in | Wt 200.0 lb

## 2018-03-31 DIAGNOSIS — M5136 Other intervertebral disc degeneration, lumbar region: Secondary | ICD-10-CM

## 2018-03-31 DIAGNOSIS — M4317 Spondylolisthesis, lumbosacral region: Secondary | ICD-10-CM | POA: Diagnosis not present

## 2018-03-31 DIAGNOSIS — M5442 Lumbago with sciatica, left side: Secondary | ICD-10-CM

## 2018-03-31 DIAGNOSIS — G8929 Other chronic pain: Secondary | ICD-10-CM | POA: Diagnosis not present

## 2018-03-31 NOTE — Patient Instructions (Addendum)
Avoid frequent bending and stooping  No lifting greater than 10 lbs. May use ice or moist heat for pain. Weight loss is of benefit. Best medication for lumbar disc disease is arthritis medications like motrin, celebrex and naprosyn. Exercise is important to improve your indurance and does allow people to function better inspite of back pain. MRI of the lumbar spine ordered to assess for worsening spinal stenosis. Based on the MRI decide with a ESI is a reasonable treatment for the sciatica.

## 2018-03-31 NOTE — Telephone Encounter (Signed)
This was discussed at todays OV appt

## 2018-03-31 NOTE — Progress Notes (Signed)
Office Visit Note   Erik Cook: Erik Cook           Date of Birth: 01/15/1961           MRN: [id number] Visit Date: 03/31/2018              Requested by: Erik referring provider defined for this encounter. PCP: Erik Cook, Erik Cook   Assessment & Plan: Visit Diagnoses:  1. Chronic left-sided low back pain with left-sided sciatica   2. Degenerative disc disease, lumbar   3. Spondylolisthesis of lumbosacral region     Plan: Avoid frequent bending and stooping  Erik lifting greater than 10 lbs. May use ice or moist heat for pain. Weight loss is of benefit. Best medication for lumbar disc disease is arthritis medications like motrin, celebrex and naprosyn. Exercise is important to improve your indurance and does allow people to function better inspite of back pain. MRI of the lumbar spine ordered to assess for worsening spinal stenosis. Based on the MRI decide with a ESI is a reasonable treatment for the sciatica. .  Follow-Up Instructions: Return in about 3 weeks (around 04/21/2018).   Orders:  Erik orders of the defined types were placed in this encounter.  Erik orders of the defined types were placed in this encounter.     Procedures: Erik procedures performed   Clinical Data: Erik additional findings.   Subjective: Chief Complaint  Erik Cook presents with  . Lower Back - Follow-up    57 year old male with long history of lower back pain and sciatica left leg. Pain is sharp into the left upper medial buttock. He has increasing pain with sitting and riding in  His truck. He has problems with sitting from long periods and bending and stooping and lifting is of concern. He has to lift the left leg some times with walking up hip. Erik bowel or bladder difficulty. He was started on Lyrica and celebrex by his primary care MD with good improvement in his pain pattern.  But the sciatica pain in the left upper buttock and left posterior lateral thigh and down to the left lateral calf.     Review of Systems   Objective: Vital Signs: BP 121/82 (BP Location: Left Arm, Erik Cook Position: Sitting)   Pulse (!) 58   Ht 5\' 9"  (1.753 m)   Wt 200 lb (90.7 kg)   BMI 29.53 kg/m   Physical Exam  Constitutional: He is oriented to person, place, and time. He appears well-developed and well-nourished.  HENT:  Head: Normocephalic and atraumatic.  Eyes: Pupils are equal, round, and reactive to light. EOM are normal.  Neck: Normal range of motion. Neck supple.  Pulmonary/Chest: Effort normal and breath sounds normal.  Abdominal: Soft. Bowel sounds are normal.  Neurological: He is alert and oriented to person, place, and time.  Skin: Skin is warm and dry.  Psychiatric: He has a normal mood and affect. His behavior is normal. Judgment and thought content normal.    Back Exam   Tenderness  The Erik Cook is experiencing tenderness in the lumbar.  Range of Motion  Extension: abnormal  Flexion: abnormal  Lateral bend right: normal  Lateral bend left: normal  Rotation right: normal  Rotation left: normal   Muscle Strength  Right Quadriceps:  5/5  Left Quadriceps:  5/5  Right Hamstrings:  5/5  Left Hamstrings:  5/5   Tests  Straight leg raise right: negative Straight leg raise left: negative  Reflexes  Patellar: normal  Achilles: normal Biceps: normal Babinski's sign: normal   Other  Toe walk: normal Heel walk: normal Sensation: normal Gait: normal  Erythema: Erik back redness Scars: absent      Specialty Comments:  Erik specialty comments available.  Imaging: Erik results found.   PMFS History: There are Erik active problems to display for this Erik Cook.  Past Medical History:  Diagnosis Date  . Hypertension     History reviewed. Erik pertinent family history.  Past Surgical History:  Procedure Laterality Date  . ANTERIOR CERVICAL DECOMP/DISCECTOMY FUSION N/A 10/05/2012   Procedure: ANTERIOR CERVICAL DECOMPRESSION/DISCECTOMY FUSION 1 LEVEL;  Surgeon:  Venita Lick, MD;  Location: [facility] OR;  Service: Orthopedics;  Laterality: N/A;  TOTAL DISC REPLACEMENT VERSES ACDF C4-5  . KNEE ARTHROSCOPY     left  . KNEE ARTHROSCOPY     on left side again  . ROTATOR CUFF REPAIR     left  . ROTATOR CUFF REPAIR     2nd time around (after fall)   Social History   Occupational History  . Not on file  Tobacco Use  . Smoking status: Never Smoker  . Smokeless tobacco: Never Used  Substance and Sexual Activity  . Alcohol use: Erik  . Drug use: Erik  . Sexual activity: Not on file

## 2018-04-17 ENCOUNTER — Ambulatory Visit
Admission: RE | Admit: 2018-04-17 | Discharge: 2018-04-17 | Disposition: A | Discharge status: Home / Self Care | Payer: Self-pay | Source: Ambulatory Visit | Attending: Specialist | Admitting: Specialist

## 2018-04-17 DIAGNOSIS — M4317 Spondylolisthesis, lumbosacral region: Secondary | ICD-10-CM

## 2018-04-19 ENCOUNTER — Telehealth (INDEPENDENT_AMBULATORY_CARE_PROVIDER_SITE_OTHER): Payer: Self-pay | Admitting: Specialist

## 2018-04-19 NOTE — Telephone Encounter (Signed)
Patient called wanted to know if Dr.Nitka received Mri results would like to go over them. Patient stated he thinks Dr.Nitka wanted to call with the results.

## 2018-04-21 NOTE — Telephone Encounter (Signed)
Patient called wanted to know if Dr.Nitka received Mri results would like to go over them. Patient stated he thinks Dr.Nitka wanted to call with the results.  ------ I tries to get patient to schedule appt to review but he insisted that Dr. Otelia SergeantNitka said he would call him with the results.

## 2018-04-26 NOTE — Telephone Encounter (Signed)
Patient called wanted to check on Mri results, patient stated he is in a lot of pain.

## 2018-04-27 NOTE — Telephone Encounter (Signed)
Patient called wanted to check on Mri results, patient stated he is in a lot of pain.  °

## 2018-04-29 NOTE — Telephone Encounter (Signed)
Patient called he really needs to schedule appointment with Dr.Newton. Mr. Hitchner stated he has a very long drive.. If Dr.Nitka could please give him a call to discuss his results.

## 2018-05-02 NOTE — Telephone Encounter (Signed)
Patient called he really needs to schedule appointment with Dr.Newton. Mr. Boxx stated he has a very long drive.. If Dr.Nitka could please give him a call to discuss his results.

## 2018-05-03 ENCOUNTER — Other Ambulatory Visit (INDEPENDENT_AMBULATORY_CARE_PROVIDER_SITE_OTHER): Payer: Self-pay | Admitting: Specialist

## 2018-05-03 DIAGNOSIS — M48062 Spinal stenosis, lumbar region with neurogenic claudication: Secondary | ICD-10-CM

## 2018-05-03 NOTE — Telephone Encounter (Signed)
I called Erik Cook and discussed the results of the study. Ordered ESI left central at L5-S1 interlam. And right L3 transforamenal. If the posterior leg and calf and heel pain not improved with the ESI then consider bilateral L5-S1 facet blocks and potentially RFA. jen

## 2018-05-17 NOTE — Telephone Encounter (Signed)
DIscussed with patient and orders entered. Erik Cook

## 2018-05-26 ENCOUNTER — Ambulatory Visit (INDEPENDENT_AMBULATORY_CARE_PROVIDER_SITE_OTHER): Payer: Self-pay

## 2018-05-26 ENCOUNTER — Encounter

## 2018-05-26 ENCOUNTER — Encounter (INDEPENDENT_AMBULATORY_CARE_PROVIDER_SITE_OTHER): Payer: Self-pay | Admitting: Physical Medicine and Rehabilitation

## 2018-05-26 ENCOUNTER — Telehealth (INDEPENDENT_AMBULATORY_CARE_PROVIDER_SITE_OTHER): Payer: Self-pay | Admitting: Specialist

## 2018-05-26 ENCOUNTER — Ambulatory Visit (INDEPENDENT_AMBULATORY_CARE_PROVIDER_SITE_OTHER): Payer: Medicare HMO | Admitting: Physical Medicine and Rehabilitation

## 2018-05-26 VITALS — BP 140/82 | HR 67 | Temp 98.0°F

## 2018-05-26 DIAGNOSIS — M48062 Spinal stenosis, lumbar region with neurogenic claudication: Secondary | ICD-10-CM

## 2018-05-26 DIAGNOSIS — M5416 Radiculopathy, lumbar region: Secondary | ICD-10-CM | POA: Diagnosis not present

## 2018-05-26 MED ORDER — METHYLPREDNISOLONE ACETATE 80 MG/ML IJ SUSP
80.0000 mg | Freq: Once | INTRAMUSCULAR | Status: AC
  Administered 2018-05-26: 80 mg

## 2018-05-26 NOTE — Patient Instructions (Signed)

## 2018-05-26 NOTE — Telephone Encounter (Signed)
Patient needs to see Dr. Otelia Sergeant 4 weeks from today per Dr. Alvester Morin.  Dr. Otelia Sergeant does not have anything until December 5th.  Please put on patient on your cancellation list.  CB#[phone].  Thank you

## 2018-05-26 NOTE — Telephone Encounter (Signed)
I put him on the cancellation list and will call if something opens sooner 

## 2018-05-26 NOTE — Progress Notes (Signed)
 .  Numeric Pain Rating Scale and Functional Assessment Average Pain 8   In the last MONTH (on 0-10 scale) has pain interfered with the following?  1. General activity like being  able to carry out your everyday physical activities such as walking, climbing stairs, carrying groceries, or moving a chair?  Rating(6)   +Driver, -BT, -Dye Allergies.  

## 2018-06-02 NOTE — Progress Notes (Signed)
Erik Cook - 57 y.o. male MRN [id number]  Date of birth: 19-Jan-1961  Office Visit Note: Visit Date: 05/26/2018 PCP: Patient, No Pcp Per Referred by: Kerrin Champagne, MD  Subjective: Chief Complaint  Patient presents with  . Lower Back - Pain  . Left Leg - Pain   HPI:  Erik Cook is a 57 y.o. male who comes in today At the request of Dr. [name] for right L3 transforaminal epidural steroid injection and left L5-S1 interlaminar epidural steroid injection.  Typically these types of mixed epidural injections were frowned upon but he does have 2 completely different pain complaints 1 being right more anterior lateral thigh pain consistent with MRI findings of foraminal stenosis at L3 which is on a new MRI which is reviewed below.  Patient also has left radicular pain down the leg.  He has had prior epidural injection at [facility] back in March of last year and we actually saw him for facet joint block as well as transforaminal injection in the latter part of last year.  He will continue with follow-up with Dr. Otelia Sergeant.  Injection be therapeutic and diagnostic.  ROS Otherwise per HPI.  Assessment & Plan: Visit Diagnoses:  1. Lumbar radiculopathy   2. Spinal stenosis of lumbar region with neurogenic claudication     Plan: No additional findings.   Meds & Orders:  Meds ordered this encounter  Medications  . methylPREDNISolone acetate (DEPO-MEDROL) injection 80 mg    Orders Placed This Encounter  Procedures  . XR C-ARM NO REPORT  . Epidural Steroid injection    Follow-up: Return in about 4 weeks (around 06/23/2018) for [name], MD.   Procedures: No procedures performed  Lumbosacral Transforaminal Epidural Steroid Injection - Sub-Pedicular Approach with Fluoroscopic Guidance  Patient: Erik Cook      Date of Birth: 09-17-1960 MRN: [id number] PCP: Patient, No Pcp Per      Visit Date: 05/26/2018   Universal Protocol:    Date/Time: 05/26/2018  Consent Given By: the  patient  Position: PRONE  Additional Comments: Vital signs were monitored before and after the procedure. Patient was prepped and draped in the usual sterile fashion. The correct patient, procedure, and site was verified.   Injection Procedure Details:  Procedure Site One Meds Administered:  Meds ordered this encounter  Medications  . methylPREDNISolone acetate (DEPO-MEDROL) injection 80 mg  **6 mg of betamethasone was used for the transforaminal injection.  Laterality: Right  Location/Site:  L3-L4  Needle size: 22 G  Needle type: Spinal  Needle Placement: Transforaminal  Findings:    -Comments: Excellent flow of contrast along the nerve and into the epidural space.  Procedure Details: After squaring off the end-plates to get a true AP view, the C-arm was positioned so that an oblique view of the foramen as noted above was visualized. The target area is just inferior to the "nose of the scotty dog" or sub pedicular. The soft tissues overlying this structure were infiltrated with 2-3 ml. of 1% Lidocaine without Epinephrine.  The spinal needle was inserted toward the target using a "trajectory" view along the fluoroscope beam.  Under AP and lateral visualization, the needle was advanced so it did not puncture dura and was located close the 6 O'Clock position of the pedical in AP tracterory. Biplanar projections were used to confirm position. Aspiration was confirmed to be negative for CSF and/or blood. A 1-2 ml. volume of Isovue-250 was injected and flow of contrast was noted at each level.  Radiographs were obtained for documentation purposes.   After attaining the desired flow of contrast documented above, a 0.5 to 1.0 ml test dose of 0.25% Marcaine was injected into each respective transforaminal space.  The patient was observed for 90 seconds post injection.  After no sensory deficits were reported, and normal lower extremity motor function was noted,   the above injectate was  administered so that equal amounts of the injectate were placed at each foramen (level) into the transforaminal epidural space.   Additional Comments:  The patient tolerated the procedure well Dressing: Band-Aid   Lumbar Epidural Steroid Injection - Interlaminar Approach with Fluoroscopic Guidance  Patient: Erik Cook      Date of Birth: 11-Jul-1961 MRN: [id number] PCP: Patient, No Pcp Per      Visit Date: 05/26/2018   Universal Protocol:     Consent Given By: the patient  Position: PRONE  Additional Comments: Vital signs were monitored before and after the procedure. Patient was prepped and draped in the usual sterile fashion. The correct patient, procedure, and site was verified.   Injection Procedure Details:  Procedure Site One Meds Administered:  Meds ordered this encounter  Medications  . methylPREDNISolone acetate (DEPO-MEDROL) injection 80 mg     Laterality: Left  Location/Site:  L5-S1  Needle size: 20 G  Needle type: Tuohy  Needle Placement: Paramedian epidural  Findings:   -Comments: Excellent flow of contrast into the epidural space.  Procedure Details: Using a paramedian approach from the side mentioned above, the region overlying the inferior lamina was localized under fluoroscopic visualization and the soft tissues overlying this structure were infiltrated with 4 ml. of 1% Lidocaine without Epinephrine. The Tuohy needle was inserted into the epidural space using a paramedian approach.   The epidural space was localized using loss of resistance along with lateral and bi-planar fluoroscopic views.  After negative aspirate for air, blood, and CSF, a 2 ml. volume of Isovue-250 was injected into the epidural space and the flow of contrast was observed. Radiographs were obtained for documentation purposes.    The injectate was administered into the level noted above.   Additional Comments:  The patient tolerated the procedure well Dressing:  Band-Aid    Post-procedure details: Patient was observed during the procedure. Post-procedure instructions were reviewed.  Patient left the clinic in stable condition.    Post-procedure details: Patient was observed during the procedure. Post-procedure instructions were reviewed.  Patient left the clinic in stable condition.    Clinical History: MRI LUMBAR SPINE WITHOUT CONTRAST  TECHNIQUE: Multiplanar, multisequence MR imaging of the lumbar spine was performed. No intravenous contrast was administered.  COMPARISON:  Radiography 12/30/2017  FINDINGS: Segmentation:  5 lumbar type vertebral bodies  Alignment:  Mild anterolisthesis at L5-S1  Vertebrae: No fracture, evidence of discitis, or aggressive bone lesion. L2 hemangioma  Conus medullaris and cauda equina: Conus extends to the L1 level. Conus and cauda equina appear normal.  Paraspinal and other soft tissues: Negative  Disc levels:  T12- L1: Unremarkable.  L1-L2: Mild facet hypertrophy.  No impingement  L2-L3: Disc narrowing and bulging. Mild facet hypertrophy. Noncompressive spinal canal and foraminal narrowing.  L3-L4: Disc narrowing and bulging. Mild facet spurring. Right foraminal impingement. Mild spinal stenosis.  L4-L5: Disc narrowing and bulging with endplate spurring. Negative facets. Noncompressive bilateral foraminal narrowing. Mild spinal stenosis  L5-S1:Mild degenerative facet spurring. Mild disc bulging. There is left subarticular recess narrowing without convincing S1 impingement.  IMPRESSION: 1. Disc narrowing and bulging at L2-3  to L4-5 with mild spinal stenosis. 2. L3-4 moderate to advanced right foraminal stenosis. There is noncompressive foraminal narrowing the other levels of disc degeneration.   Electronically Signed   By: Marnee Spring M.D.   On: 04/17/2018 10:18     Objective:  VS:  HT:    WT:   BMI:     BP:140/82  HR:67bpm  TEMP:98 F (36.7  C)(Oral)  RESP:  Physical Exam  Ortho Exam Imaging: No results found.

## 2018-06-02 NOTE — Procedures (Signed)
Lumbosacral Transforaminal Epidural Steroid Injection - Sub-Pedicular Approach with Fluoroscopic Guidance  Patient: Erik Cook      Date of Birth: [birth year] MRN: [id number] PCP: Patient, No Pcp Per      Visit Date: 05/26/2018   Universal Protocol:    Date/Time: 05/26/2018  Consent Given By: the patient  Position: PRONE  Additional Comments: Vital signs were monitored before and after the procedure. Patient was prepped and draped in the usual sterile fashion. The correct patient, procedure, and site was verified.   Injection Procedure Details:  Procedure Site One Meds Administered:  Meds ordered this encounter  Medications  . methylPREDNISolone acetate (DEPO-MEDROL) injection 80 mg  **6 mg of betamethasone was used for the transforaminal injection.  Laterality: Right  Location/Site:  L3-L4  Needle size: 22 G  Needle type: Spinal  Needle Placement: Transforaminal  Findings:    -Comments: Excellent flow of contrast along the nerve and into the epidural space.  Procedure Details: After squaring off the end-plates to get a true AP view, the C-arm was positioned so that an oblique view of the foramen as noted above was visualized. The target area is just inferior to the "nose of the scotty dog" or sub pedicular. The soft tissues overlying this structure were infiltrated with 2-3 ml. of 1% Lidocaine without Epinephrine.  The spinal needle was inserted toward the target using a "trajectory" view along the fluoroscope beam.  Under AP and lateral visualization, the needle was advanced so it did not puncture dura and was located close the 6 O'Clock position of the pedical in AP tracterory. Biplanar projections were used to confirm position. Aspiration was confirmed to be negative for CSF and/or blood. A 1-2 ml. volume of Isovue-250 was injected and flow of contrast was noted at each level. Radiographs were obtained for documentation purposes.   After attaining the desired flow  of contrast documented above, a 0.5 to 1.0 ml test dose of 0.25% Marcaine was injected into each respective transforaminal space.  The patient was observed for 90 seconds post injection.  After no sensory deficits were reported, and normal lower extremity motor function was noted,   the above injectate was administered so that equal amounts of the injectate were placed at each foramen (level) into the transforaminal epidural space.   Additional Comments:  The patient tolerated the procedure well Dressing: Band-Aid   Lumbar Epidural Steroid Injection - Interlaminar Approach with Fluoroscopic Guidance  Patient: [name]      Date of Birth: 08-25-1960 MRN: [id number] PCP: Patient, No Pcp Per      Visit Date: 05/26/2018   Universal Protocol:     Consent Given By: the patient  Position: PRONE  Additional Comments: Vital signs were monitored before and after the procedure. Patient was prepped and draped in the usual sterile fashion. The correct patient, procedure, and site was verified.   Injection Procedure Details:  Procedure Site One Meds Administered:  Meds ordered this encounter  Medications  . methylPREDNISolone acetate (DEPO-MEDROL) injection 80 mg     Laterality: Left  Location/Site:  L5-S1  Needle size: 20 G  Needle type: Tuohy  Needle Placement: Paramedian epidural  Findings:   -Comments: Excellent flow of contrast into the epidural space.  Procedure Details: Using a paramedian approach from the side mentioned above, the region overlying the inferior lamina was localized under fluoroscopic visualization and the soft tissues overlying this structure were infiltrated with 4 ml. of 1% Lidocaine without Epinephrine. The Tuohy needle was inserted into the  epidural space using a paramedian approach.   The epidural space was localized using loss of resistance along with lateral and bi-planar fluoroscopic views.  After negative aspirate for air, blood, and CSF, a 2  ml. volume of Isovue-250 was injected into the epidural space and the flow of contrast was observed. Radiographs were obtained for documentation purposes.    The injectate was administered into the level noted above.   Additional Comments:  The patient tolerated the procedure well Dressing: Band-Aid    Post-procedure details: Patient was observed during the procedure. Post-procedure instructions were reviewed.  Patient left the clinic in stable condition.    Post-procedure details: Patient was observed during the procedure. Post-procedure instructions were reviewed.  Patient left the clinic in stable condition.

## 2018-07-07 ENCOUNTER — Encounter (INDEPENDENT_AMBULATORY_CARE_PROVIDER_SITE_OTHER): Payer: Self-pay | Admitting: Specialist

## 2018-07-07 ENCOUNTER — Ambulatory Visit (INDEPENDENT_AMBULATORY_CARE_PROVIDER_SITE_OTHER): Payer: Medicare HMO | Admitting: Specialist

## 2018-07-07 VITALS — BP 115/73 | HR 64 | Ht 69.0 in | Wt 200.0 lb

## 2018-07-07 DIAGNOSIS — M5489 Other dorsalgia: Secondary | ICD-10-CM | POA: Diagnosis not present

## 2018-07-07 DIAGNOSIS — M48062 Spinal stenosis, lumbar region with neurogenic claudication: Secondary | ICD-10-CM | POA: Diagnosis not present

## 2018-07-07 DIAGNOSIS — M4726 Other spondylosis with radiculopathy, lumbar region: Secondary | ICD-10-CM | POA: Diagnosis not present

## 2018-07-07 NOTE — Progress Notes (Signed)
Office Visit Note   Patient: Erik Cook           Date of Birth: [birth year]           MRN: [id number] Visit Date: 07/07/2018              Requested by: No referring provider defined for this encounter. PCP: Patient, No Pcp Per   Assessment & Plan: Visit Diagnoses:  1. Spinal stenosis of lumbar region with neurogenic claudication   2. Other spondylosis with radiculopathy, lumbar region     Plan: Avoid bending, stooping and avoid lifting weights greater than 10 lbs. Avoid prolong standing and walking. Avoid frequent bending and stooping  No lifting greater than 10 lbs. May use ice or moist heat for pain. Weight loss is of benefit. Handicap license is approved. Call if pain recurrs and you wish to consider further ESI and Dr. Philomath Blas secretary/Assistant will call to arrange for epidural steroid injection.  Ask insurance company if generic Lyrica is available ( pregabalin) Follow-Up Instructions: No follow-ups on file.   Orders:  No orders of the defined types were placed in this encounter.  No orders of the defined types were placed in this encounter.     Procedures: No procedures performed   Clinical Data: No additional findings.   Subjective: Chief Complaint  Patient presents with  . Lower Back - Follow-up    Had right L3-4 TF injection with Dr. Alvester Morin on 05/26/18. He said she got 80% relief.    57 year old male with disability due to lumbar condition that was related to injury while employed by the [employer] system. He underwent ESI by Dr. Alvester Morin 10/24 with about 85 % relief. He is concerned that his insurance company may no longer pay for lyrica and this  Has been helpful in treatment of his chronic back and leg pain.    Review of Systems  Constitutional: Negative.  Negative for activity change, appetite change, chills, diaphoresis, fatigue, fever and unexpected weight change.  HENT: Negative.   Eyes: Negative.   Respiratory: Negative.     Cardiovascular: Negative.   Gastrointestinal: Negative.   Endocrine: Negative.   Genitourinary: Negative.   Musculoskeletal: Negative.   Skin: Negative.   Allergic/Immunologic: Negative.   Neurological: Negative.   Hematological: Negative.   Psychiatric/Behavioral: Negative.      Objective: Vital Signs: BP 115/73 (BP Location: Left Arm, Patient Position: Sitting)   Pulse 64   Ht 5\' 9"  (1.753 m)   Wt 200 lb (90.7 kg)   BMI 29.53 kg/m   Physical Exam  Back Exam   Tenderness  The patient is experiencing tenderness in the lumbar.  Range of Motion  Extension: abnormal  Flexion: abnormal  Lateral bend right: abnormal  Lateral bend left: abnormal  Rotation right: abnormal  Rotation left: abnormal   Muscle Strength  Right Quadriceps:  5/5  Left Quadriceps:  5/5  Right Hamstrings:  5/5  Left Hamstrings:  5/5   Tests  Straight leg raise right: negative Straight leg raise left: negative  Reflexes  Patellar: 1/4 Achilles: 1/4 Babinski's sign: normal   Other  Toe walk: normal Heel walk: normal Sensation: normal Gait: normal  Erythema: no back redness Scars: absent      Specialty Comments:  No specialty comments available.  Imaging: No results found.   PMFS History: There are no active problems to display for this patient.  Past Medical History:  Diagnosis Date  . Hypertension  No family history on file.  Past Surgical History:  Procedure Laterality Date  . 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 KNEE ARTHROSCOPY     left  . KNEE ARTHROSCOPY     on left side again  . ROTATOR CUFF REPAIR     left  . ROTATOR CUFF REPAIR     2nd time around (after fall)   Social History   Occupational History  . Not on file  Tobacco  Use  . Smoking status: Never Smoker  . Smokeless tobacco: Never Used  Substance and Sexual Activity  . Alcohol use: No  . Drug use: No  . Sexual activity: Not on file

## 2018-07-07 NOTE — Patient Instructions (Addendum)
Plan: Avoid bending, stooping and avoid lifting weights greater than 10 lbs. Avoid prolong standing and walking. Avoid frequent bending and stooping  No lifting greater than 10 lbs. May use ice or moist heat for pain. Weight loss is of benefit. Handicap license is approved. Call if pain recurrs and you wish to consider further ESI and Dr. Sum[place] BlasNewton's secretary/Assistant will call to arrange for epidural steroid injection.  Ask insurance company if generic Lyrica is available ( pregabalin)

## 2018-08-23 ENCOUNTER — Telehealth (INDEPENDENT_AMBULATORY_CARE_PROVIDER_SITE_OTHER): Payer: Self-pay | Admitting: *Deleted

## 2018-08-24 NOTE — Telephone Encounter (Signed)
Service [phone] Auth Effective Date:01/22/2020Auth End Date:07/20/2020Initiated Date:01/22/2020Decision Date:01/22/2020Decision Type :InitialCase Status:Approved  Pt is scheduled for 09/15/2018 with driver.

## 2018-08-24 NOTE — Telephone Encounter (Signed)
If it helped

## 2018-09-15 ENCOUNTER — Ambulatory Visit (INDEPENDENT_AMBULATORY_CARE_PROVIDER_SITE_OTHER): Payer: Medicare HMO | Admitting: Physical Medicine and Rehabilitation

## 2018-09-15 ENCOUNTER — Ambulatory Visit (INDEPENDENT_AMBULATORY_CARE_PROVIDER_SITE_OTHER): Payer: Self-pay

## 2018-09-15 ENCOUNTER — Encounter (INDEPENDENT_AMBULATORY_CARE_PROVIDER_SITE_OTHER): Payer: Self-pay | Admitting: Physical Medicine and Rehabilitation

## 2018-09-15 ENCOUNTER — Encounter

## 2018-09-15 VITALS — BP 119/73 | HR 88 | Temp 98.3°F

## 2018-09-15 DIAGNOSIS — M5416 Radiculopathy, lumbar region: Secondary | ICD-10-CM

## 2018-09-15 MED ORDER — BETAMETHASONE SOD PHOS & ACET 6 (3-3) MG/ML IJ SUSP
12.0000 mg | Freq: Once | INTRAMUSCULAR | Status: AC
  Administered 2018-09-15: 12 mg

## 2018-09-15 NOTE — Progress Notes (Signed)
 .  Numeric Pain Rating Scale and Functional Assessment Average Pain 8   In the last MONTH (on 0-10 scale) has pain interfered with the following?  1. General activity like being  able to carry out your everyday physical activities such as walking, climbing stairs, carrying groceries, or moving a chair?  Rating(4)   +Driver, -BT, -Dye Allergies.  

## 2018-09-16 NOTE — Progress Notes (Signed)
Erik Cook - 58 y.o. male MRN [id number]  Date of birth: 02-21-1961  Office Visit Note: Visit Date: 09/15/2018 PCP: Patient, No Pcp Per Referred by: No ref. provider found  Subjective: Chief Complaint  Patient presents with  . Left Leg - Pain   HPI:  Erik Cook is a 58 y.o. male who comes in today At the request of Dr. [name] for repeat interventional spine procedure.  The last time I saw the patient we did complete a left interlaminar epidural steroid injection at L5-S1 for the patient's left radicular pain which is in an L5 and S1 distribution.  MRI fairly benign.  We also completed a right L3 transforaminal injection at the request of Dr. Otelia Sergeant for right hip pain.  Typically we do not like to complete multiple epidural injections in 1 sitting but we did do this at the request of Dr. Otelia Sergeant his spine surgeon.  We will repeat that today.  ROS Otherwise per HPI.  Assessment & Plan: Visit Diagnoses:  1. Lumbar radiculopathy     Plan: No additional findings.   Meds & Orders:  Meds ordered this encounter  Medications  . betamethasone acetate-betamethasone sodium phosphate (CELESTONE) injection 12 mg    Orders Placed This Encounter  Procedures  . XR C-ARM NO REPORT  . Epidural Steroid injection    Follow-up: Return if symptoms worsen or fail to improve.   Procedures: No procedures performed  Lumbosacral Transforaminal Epidural Steroid Injection - Sub-Pedicular Approach with Fluoroscopic Guidance  Patient: Erik Cook      Date of Birth: [birth year] MRN: [id number] PCP: Patient, No Pcp Per      Visit Date: 09/15/2018   Universal Protocol:    Date/Time: 09/15/2018  Consent Given By: the patient  Position: PRONE  Additional Comments: Vital signs were monitored before and after the procedure. Patient was prepped and draped in the usual sterile fashion. The correct patient, procedure, and site was verified.   Injection Procedure Details:  Procedure Site  One Meds Administered:  Meds ordered this encounter  Medications  . betamethasone acetate-betamethasone sodium phosphate (CELESTONE) injection 12 mg    Laterality: Right  Location/Site:  L3-L4  Needle size: 22 G  Needle type: Spinal  Needle Placement: Transforaminal  Findings:    -Comments: Excellent flow of contrast along the nerve and into the epidural space.  Procedure Details: After squaring off the end-plates to get a true AP view, the C-arm was positioned so that an oblique view of the foramen as noted above was visualized. The target area is just inferior to the "nose of the scotty dog" or sub pedicular. The soft tissues overlying this structure were infiltrated with 2-3 ml. of 1% Lidocaine without Epinephrine.  The spinal needle was inserted toward the target using a "trajectory" view along the fluoroscope beam.  Under AP and lateral visualization, the needle was advanced so it did not puncture dura and was located close the 6 O'Clock position of the pedical in AP tracterory. Biplanar projections were used to confirm position. Aspiration was confirmed to be negative for CSF and/or blood. A 1-2 ml. volume of Isovue-250 was injected and flow of contrast was noted at each level. Radiographs were obtained for documentation purposes.   After attaining the desired flow of contrast documented above, a 0.5 to 1.0 ml test dose of 0.25% Marcaine was injected into each respective transforaminal space.  The patient was observed for 90 seconds post injection.  After no sensory deficits were reported, and normal  lower extremity motor function was noted,   the above injectate was administered so that equal amounts of the injectate were placed at each foramen (level) into the transforaminal epidural space.   Additional Comments:  The patient tolerated the procedure well Dressing: Band-Aid    Post-procedure details: Patient was observed during the procedure. Post-procedure instructions  were reviewed.  Patient left the clinic in stable condition.  Lumbar Epidural Steroid Injection - Interlaminar Approach with Fluoroscopic Guidance  Patient: Erik Cook      Date of Birth: 29-Jan-1961 MRN: [id number] PCP: Patient, No Pcp Per      Visit Date: 09/15/2018   Universal Protocol:     Consent Given By: the patient  Position: PRONE  Additional Comments: Vital signs were monitored before and after the procedure. Patient was prepped and draped in the usual sterile fashion. The correct patient, procedure, and site was verified.   Injection Procedure Details:  Procedure Site One Meds Administered:  Meds ordered this encounter  Medications  . betamethasone acetate-betamethasone sodium phosphate (CELESTONE) injection 12 mg     Laterality: Left  Location/Site:  L5-S1  Needle size: 20 G  Needle type: Tuohy  Needle Placement: Paramedian epidural  Findings:   -Comments: Excellent flow of contrast into the epidural space.  Procedure Details: Using a paramedian approach from the side mentioned above, the region overlying the inferior lamina was localized under fluoroscopic visualization and the soft tissues overlying this structure were infiltrated with 4 ml. of 1% Lidocaine without Epinephrine. The Tuohy needle was inserted into the epidural space using a paramedian approach.   The epidural space was localized using loss of resistance along with lateral and bi-planar fluoroscopic views.  After negative aspirate for air, blood, and CSF, a 2 ml. volume of Isovue-250 was injected into the epidural space and the flow of contrast was observed. Radiographs were obtained for documentation purposes.    The injectate was administered into the level noted above.   Additional Comments:  The patient tolerated the procedure well Dressing: Band-Aid    Post-procedure details: Patient was observed during the procedure. Post-procedure instructions were reviewed.  Patient  left the clinic in stable condition.     Clinical History: MRI LUMBAR SPINE WITHOUT CONTRAST  TECHNIQUE: Multiplanar, multisequence MR imaging of the lumbar spine was performed. No intravenous contrast was administered.  COMPARISON:  Radiography 12/30/2017  FINDINGS: Segmentation:  5 lumbar type vertebral bodies  Alignment:  Mild anterolisthesis at L5-S1  Vertebrae: No fracture, evidence of discitis, or aggressive bone lesion. L2 hemangioma  Conus medullaris and cauda equina: Conus extends to the L1 level. Conus and cauda equina appear normal.  Paraspinal and other soft tissues: Negative  Disc levels:  T12- L1: Unremarkable.  L1-L2: Mild facet hypertrophy.  No impingement  L2-L3: Disc narrowing and bulging. Mild facet hypertrophy. Noncompressive spinal canal and foraminal narrowing.  L3-L4: Disc narrowing and bulging. Mild facet spurring. Right foraminal impingement. Mild spinal stenosis.  L4-L5: Disc narrowing and bulging with endplate spurring. Negative facets. Noncompressive bilateral foraminal narrowing. Mild spinal stenosis  L5-S1:Mild degenerative facet spurring. Mild disc bulging. There is left subarticular recess narrowing without convincing S1 impingement.  IMPRESSION: 1. Disc narrowing and bulging at L2-3 to L4-5 with mild spinal stenosis. 2. L3-4 moderate to advanced right foraminal stenosis. There is noncompressive foraminal narrowing the other levels of disc degeneration.   Electronically Signed   By: Marnee Spring M.D.   On: 04/17/2018 10:18     Objective:  VS:  HT:  WT:   BMI:     BP:119/73  HR:88bpm  TEMP:98.3 F (36.8 C)(Oral)  RESP:  Physical Exam  Ortho Exam Imaging: Xr C-arm No Report  Result Date: 09/15/2018 Please see Notes tab for imaging impression.

## 2018-09-16 NOTE — Procedures (Signed)
Lumbosacral Transforaminal Epidural Steroid Injection - Sub-Pedicular Approach with Fluoroscopic Guidance  Patient: Erik Cook      Date of Birth: Jan 16, 1961 MRN: [id number] PCP: Patient, No Pcp Per      Visit Date: 09/15/2018   Universal Protocol:    Date/Time: 09/15/2018  Consent Given By: the patient  Position: PRONE  Additional Comments: Vital signs were monitored before and after the procedure. Patient was prepped and draped in the usual sterile fashion. The correct patient, procedure, and site was verified.   Injection Procedure Details:  Procedure Site One Meds Administered:  Meds ordered this encounter  Medications  . betamethasone acetate-betamethasone sodium phosphate (CELESTONE) injection 12 mg    Laterality: Right  Location/Site:  L3-L4  Needle size: 22 G  Needle type: Spinal  Needle Placement: Transforaminal  Findings:    -Comments: Excellent flow of contrast along the nerve and into the epidural space.  Procedure Details: After squaring off the end-plates to get a true AP view, the C-arm was positioned so that an oblique view of the foramen as noted above was visualized. The target area is just inferior to the "nose of the scotty dog" or sub pedicular. The soft tissues overlying this structure were infiltrated with 2-3 ml. of 1% Lidocaine without Epinephrine.  The spinal needle was inserted toward the target using a "trajectory" view along the fluoroscope beam.  Under AP and lateral visualization, the needle was advanced so it did not puncture dura and was located close the 6 O'Clock position of the pedical in AP tracterory. Biplanar projections were used to confirm position. Aspiration was confirmed to be negative for CSF and/or blood. A 1-2 ml. volume of Isovue-250 was injected and flow of contrast was noted at each level. Radiographs were obtained for documentation purposes.   After attaining the desired flow of contrast documented above, a 0.5 to  1.0 ml test dose of 0.25% Marcaine was injected into each respective transforaminal space.  The patient was observed for 90 seconds post injection.  After no sensory deficits were reported, and normal lower extremity motor function was noted,   the above injectate was administered so that equal amounts of the injectate were placed at each foramen (level) into the transforaminal epidural space.   Additional Comments:  The patient tolerated the procedure well Dressing: Band-Aid    Post-procedure details: Patient was observed during the procedure. Post-procedure instructions were reviewed.  Patient left the clinic in stable condition.  Lumbar Epidural Steroid Injection - Interlaminar Approach with Fluoroscopic Guidance  Patient: Erik Cook      Date of Birth: [birth year] MRN: [id number] PCP: Patient, No Pcp Per      Visit Date: 09/15/2018   Universal Protocol:     Consent Given By: the patient  Position: PRONE  Additional Comments: Vital signs were monitored before and after the procedure. Patient was prepped and draped in the usual sterile fashion. The correct patient, procedure, and site was verified.   Injection Procedure Details:  Procedure Site One Meds Administered:  Meds ordered this encounter  Medications  . betamethasone acetate-betamethasone sodium phosphate (CELESTONE) injection 12 mg     Laterality: Left  Location/Site:  L5-S1  Needle size: 20 G  Needle type: Tuohy  Needle Placement: Paramedian epidural  Findings:   -Comments: Excellent flow of contrast into the epidural space.  Procedure Details: Using a paramedian approach from the side mentioned above, the region overlying the inferior lamina was localized under fluoroscopic visualization and the soft tissues overlying this structure  were infiltrated with 4 ml. of 1% Lidocaine without Epinephrine. The Tuohy needle was inserted into the epidural space using a paramedian approach.   The epidural  space was localized using loss of resistance along with lateral and bi-planar fluoroscopic views.  After negative aspirate for air, blood, and CSF, a 2 ml. volume of Isovue-250 was injected into the epidural space and the flow of contrast was observed. Radiographs were obtained for documentation purposes.    The injectate was administered into the level noted above.   Additional Comments:  The patient tolerated the procedure well Dressing: Band-Aid    Post-procedure details: Patient was observed during the procedure. Post-procedure instructions were reviewed.  Patient left the clinic in stable condition.

## 2018-12-28 ENCOUNTER — Telehealth: Payer: Self-pay | Admitting: Specialist

## 2018-12-28 NOTE — Telephone Encounter (Signed)
Patient is requesting repeat injections for his neck, and Dr. Alvester Morin staff need an order for them.  He is sched for an appt with Dr. Otelia Sergeant in June 2020.----Please advise

## 2019-01-05 ENCOUNTER — Other Ambulatory Visit: Payer: Self-pay

## 2019-01-05 ENCOUNTER — Ambulatory Visit: Payer: Medicare HMO | Admitting: Specialist

## 2019-01-05 ENCOUNTER — Ambulatory Visit: Payer: Self-pay

## 2019-01-05 ENCOUNTER — Encounter: Payer: Self-pay | Admitting: Specialist

## 2019-01-05 ENCOUNTER — Ambulatory Visit (INDEPENDENT_AMBULATORY_CARE_PROVIDER_SITE_OTHER): Payer: Medicare HMO

## 2019-01-05 VITALS — BP 122/78 | HR 81 | Ht 69.0 in | Wt 200.0 lb

## 2019-01-05 DIAGNOSIS — M25512 Pain in left shoulder: Secondary | ICD-10-CM

## 2019-01-05 DIAGNOSIS — R29898 Other symptoms and signs involving the musculoskeletal system: Secondary | ICD-10-CM

## 2019-01-05 DIAGNOSIS — M4722 Other spondylosis with radiculopathy, cervical region: Secondary | ICD-10-CM

## 2019-01-05 DIAGNOSIS — M4322 Fusion of spine, cervical region: Secondary | ICD-10-CM | POA: Diagnosis not present

## 2019-01-05 DIAGNOSIS — M25511 Pain in right shoulder: Secondary | ICD-10-CM | POA: Diagnosis not present

## 2019-01-05 DIAGNOSIS — R200 Anesthesia of skin: Secondary | ICD-10-CM

## 2019-01-05 DIAGNOSIS — M5030 Other cervical disc degeneration, unspecified cervical region: Secondary | ICD-10-CM

## 2019-01-05 DIAGNOSIS — M542 Cervicalgia: Secondary | ICD-10-CM | POA: Diagnosis not present

## 2019-01-05 MED ORDER — BACLOFEN 10 MG PO TABS: 10.0000 mg | ORAL_TABLET | Freq: Three times a day (TID) | ORAL | 0 refills | Status: DC

## 2019-01-05 NOTE — Patient Instructions (Addendum)
Avoid overhead lifting and overhead use of the arms. Do not lift greater than 5 lbs. Adjust head rest in vehicle to prevent hyperextension if rear ended. Take extra precautions to avoid falling. Avoid overhead lifting and overhead use of the arms. Do not lift greater than 10 lbs. Tylenol ES one every 6-8 hours for pain and inflamation.  Continue with Home Exercises for another 2-3 weeks, then a home exercise program. Tylenol AS 650 mg up to 5 tablets per day. Hemp CBD oil capsules may help decrease pain and is legal. I recommend Hemp CBD oil capsules 5,000-7,000 mg Take one capsule BID.  MRI of the cervical spine due to worsening arm weakness and left arm numbess. Baclofen 10 mg up to TID for muscle spasm.

## 2019-01-05 NOTE — Progress Notes (Signed)
Office Visit Note   Patient: Erik Cook           Date of Birth: 12/01/1960           MRN: [id number] Visit Date: 01/05/2019              Requested by: No referring provider defined for this encounter. PCP: Patient, No Pcp Per   Assessment & Plan: Visit Diagnoses:  1. Acute pain of both shoulders   2. Weakness of shoulder   3. Fusion of spine, cervical region   4. Degenerative disc disease, cervical   5. Lt arm numbness   6. Cervicalgia   7. Spondylosis of cervical spine with radiculopathy   8. Other cervical disc degeneration, unspecified cervical region     Plan: Avoid overhead lifting and overhead use of the arms. Do not lift greater than 5 lbs. Adjust head rest in vehicle to prevent hyperextension if rear ended. Take extra precautions to avoid falling. Avoid overhead lifting and overhead use of the arms. Do not lift greater than 10 lbs. Tylenol ES one every 6-8 hours for pain and inflamation.  Continue with Home Exercises for another 2-3 weeks, then a home exercise program. Tylenol AS 650 mg up to 5 tablets per day. Hemp CBD oil capsules may help decrease pain and is legal. I recommend Hemp CBD oil capsules 5,000-7,000 mg Take one capsule BID.  MRI of the cervical spine due to worsening arm weakness and left arm numbess. Baclofen 10 mg up to TID for muscle spasm.  Follow-Up Instructions: Return in about 3 weeks (around 01/26/2019).   Orders:  Orders Placed This Encounter  Procedures  . XR Shoulder Left  . XR Shoulder Right  . XR Cervical Spine 2 or 3 views  . MR CERVICAL SPINE WO CONTRAST   Meds ordered this encounter  Medications  . baclofen (LIORESAL) 10 MG tablet    Sig: Take 1 tablet (10 mg total) by mouth 3 (three) times daily.    Dispense:  60 each    Refill:  0      Procedures: No procedures performed   Clinical Data: No additional findings.   Subjective: Chief Complaint  Patient presents with  . Neck - Follow-up  . Lower Back -  Follow-up    58 year old male with history of fall while at work and lumbar and cervical condition. He had left sided ESI for foramenal narrowing and disc protrusion. The pain is better since an ESI and he reports he has less pain but still sciatica in the left leg. No bowel or bladder habits. Still some neck pain and shoulder and trapezius pain. The studies have in the past have shown Spondylosis changes. He unfortunately is having to deal with some discord with neighbors but is Trying to get some exercise in daily. Neck pain is worsening with stiffness it is an "8", it has been  Spiking up over a "10"  intermittantly and is hoping ESI may be a consideration for cervicalgia and upper extremity symptoms. Pain in the right and left shoulders, numbness into the left middle volar fingers. DIfficulty sleeping on shoulders, turning his head increasing hypertension.  He reports dropping of intems and more trouble with walking lately. He weaned from lyrica and    Review of Systems  Constitutional: Negative.   HENT: Negative.   Eyes: Negative.   Respiratory: Negative.   Cardiovascular: Negative.   Gastrointestinal: Negative.   Endocrine: Negative.   Genitourinary: Negative.  Musculoskeletal: Positive for back pain, neck pain and neck stiffness.  Skin: Negative for color change, pallor, rash and wound.  Allergic/Immunologic: Negative for environmental allergies, food allergies and immunocompromised state.  Neurological: Positive for weakness, numbness and headaches. Negative for dizziness, tremors, seizures, syncope, facial asymmetry, speech difficulty and light-headedness.     Objective: Vital Signs: BP 122/78 (BP Location: Left Arm, Patient Position: Sitting)   Pulse 81   Ht 5\' 9"  (1.753 m)   Wt 200 lb (90.7 kg)   BMI 29.53 kg/m   Physical Exam Constitutional:      Appearance: He is well-developed.  HENT:     Head: Normocephalic and atraumatic.  Eyes:     Pupils: Pupils are equal,  round, and reactive to light.  Neck:     Musculoskeletal: Normal range of motion and neck supple.  Pulmonary:     Effort: Pulmonary effort is normal.     Breath sounds: Normal breath sounds.  Abdominal:     General: Bowel sounds are normal.     Palpations: Abdomen is soft.  Skin:    General: Skin is warm and dry.  Neurological:     Mental Status: He is alert and oriented to person, place, and time.  Psychiatric:        Behavior: Behavior normal.        Thought Content: Thought content normal.        Judgment: Judgment normal.     Back Exam   Tenderness  The patient is experiencing tenderness in the cervical.  Range of Motion  Extension:  70 abnormal  Flexion:  60 abnormal  Lateral bend right:  70 abnormal  Lateral bend left: 50  Rotation right: 60  Rotation left:  50 abnormal   Muscle Strength  Right Quadriceps:  5/5  Left Quadriceps:  5/5  Right Hamstrings:  5/5  Left Hamstrings:  5/5   Tests  Straight leg raise right: negative Straight leg raise left: negative  Reflexes  Patellar: 0/4 Achilles: 0/4 Biceps: 2/4 Babinski's sign: normal   Other  Toe walk: normal Heel walk: normal Sensation: normal Gait: normal  Erythema: no back redness Scars: present  Comments:  Weak bilateral shoulder abduction and bilateral biceps and triceps. FE bilateral  is normal wrist DF bilateral is normal. Bilateral triceps 4-/5 right finger abduction strength is 4+/5, left is normal.  ROM of the shoulders with pain with ER and abduction, positive impingement sign. Drop arm sign is mildly postitive left greater than right.  Positive tinel's sign bilateral.    Right Hand Exam   Range of Motion  Wrist  Extension: normal  Flexion: normal  Pronation: normal  Supination: normal   Muscle Strength  Wrist extension: 5/5  Wrist flexion: 5/5  Grip: 5/5   Tests  Phalen's sign: positive Tinel's sign (median nerve): positive  Other  Erythema: absent Scars: absent  Sensation: none Pulse: present   Left Hand Exam   Range of Motion  Wrist  Extension: normal  Flexion: normal  Pronation: normal  Supination: normal   Muscle Strength  Wrist extension: 5/5  Wrist flexion: 5/5  Grip:  5/5   Tests  Phalen's sign: positive Tinel's sign (median nerve): positive  Other  Erythema: absent Scars: absent Sensation: none Pulse: absent      Specialty Comments:  No specialty comments available.  Imaging: Xr Cervical Spine 2 Or 3 Views  Result Date: 01/05/2019 AP and lateral flexion and extension radiographs of the cervical spine show progressive disc  narrowing C5-6 and C6-7 below the C4-5 fusion. There is progression of anterior disc calcification and osteophytes. Moderate uncovertebral degenerative changes bilateral right and left C6-7 and right C5-6. No fracture of dislocation or subluxation. The level above the fusion at C4-5 and retained plate and screws is relatively normal.   Xr Shoulder Left  Result Date: 01/05/2019 Left shoulder AP, axillary lateral and outlet views with SAS narrowing which is worse laterally, GH joint line narrowing with elevation of the humeral head in the left glenoid, osteophytes over the inferior glenoid and anterior and posterior humeral head. There is a retained threaded anchor left greater tuberosity with some cystic changes and osteopenia in the area of the left greater tuberosity. Findings suggestive of chronic rotator cuff arthropathy post repair with likely deficit rotator cuff. Moderate DJD left AC joint with joint line narrowing and moderate osteophytes. Elevation of the left distal clavicle like secondary to old injury.   Xr Shoulder Right  Result Date: 01/05/2019 Right shoulder 3 way view with minimal Degenerative spur inferior right humeral head, G-H joint line is well maintained. The right AC joint with minimal DJD with inferior distal clavicle Osteophyte and joint line well maintained. SAS is normal. Type 1  greater any type 2 changes of the acromion, minimal sclerosis in the interval between the right greater tuberosity and the lateral superior humeral head.     PMFS History: There are no active problems to display for this patient.  Past Medical History:  Diagnosis Date  . Hypertension     History reviewed. No pertinent family history.  Past Surgical History:  Procedure Laterality Date  . 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 KNEE ARTHROSCOPY     left  . KNEE ARTHROSCOPY     on left side again  . ROTATOR CUFF REPAIR     left  . ROTATOR CUFF REPAIR     2nd time around (after fall)   Social History   Occupational History  . Not on file  Tobacco Use  . Smoking status: Never Smoker  . Smokeless tobacco: Never Used  Substance and Sexual Activity  . Alcohol use: No  . Drug use: No  . Sexual activity: Not on file

## 2019-01-25 ENCOUNTER — Ambulatory Visit
Admission: RE | Admit: 2019-01-25 | Discharge: 2019-01-25 | Disposition: A | Discharge status: Home / Self Care | Payer: Medicare HMO | Source: Ambulatory Visit | Attending: Specialist | Admitting: Specialist

## 2019-01-25 DIAGNOSIS — M542 Cervicalgia: Secondary | ICD-10-CM

## 2019-01-25 DIAGNOSIS — M5030 Other cervical disc degeneration, unspecified cervical region: Secondary | ICD-10-CM

## 2019-02-09 ENCOUNTER — Ambulatory Visit (INDEPENDENT_AMBULATORY_CARE_PROVIDER_SITE_OTHER): Payer: Medicare HMO | Admitting: Specialist

## 2019-02-09 ENCOUNTER — Encounter: Payer: Self-pay | Admitting: Specialist

## 2019-02-09 ENCOUNTER — Other Ambulatory Visit: Payer: Self-pay

## 2019-02-09 VITALS — BP 113/70 | HR 75 | Ht 69.0 in | Wt 200.0 lb

## 2019-02-09 DIAGNOSIS — M48062 Spinal stenosis, lumbar region with neurogenic claudication: Secondary | ICD-10-CM | POA: Diagnosis not present

## 2019-02-09 DIAGNOSIS — M5481 Occipital neuralgia: Secondary | ICD-10-CM

## 2019-02-09 MED ORDER — BACLOFEN 10 MG PO TABS: 10.0000 mg | ORAL_TABLET | Freq: Three times a day (TID) | ORAL | 0 refills | Status: DC

## 2019-02-09 NOTE — Patient Instructions (Addendum)
CLINICAL DATA:  Neck pain with bilateral arm pain and weakness and numbness. Previous anterior cervical fusion at C4-5.  EXAM: MRI CERVICAL SPINE WITHOUT CONTRAST  TECHNIQUE: Multiplanar, multisequence MR imaging of the cervical spine was performed. No intravenous contrast was administered.  COMPARISON:  Radiographs dated 01/05/2019  FINDINGS: Alignment: 1.5 mm retrolisthesis of C6 on C7.  Otherwise normal.  Vertebrae: Solid anterior fusion at C4-5. No fracture or discitis or significant bone lesion.  Cord: Normal signal and morphology.  Posterior Fossa, vertebral arteries, paraspinal tissues: Negative.  Disc levels:  C2-3: Normal disc. Minimal degenerative changes of the facet joints. Widely patent neural foramina.  C3-4: Normal disc. Minimal degenerative changes of the facet joints. Widely patent neural foramina.  C4-5: Solid anterior fusion. Widely patent neural foramina. No neural impingement.  C5-6: Disc space narrowing. Small disc bulges to the right and left of midline more prominent to the right with slight right foraminal narrowing.  C6-7: Disc space narrowing. Osteophytes protrude into both neural foramina creating mild bilateral foraminal narrowing, left more than right.  C7-T1: Normal.  IMPRESSION: 1. Degenerative disc disease at C5-6 and C6-7 with slight bilateral foraminal narrowing at C6-7 and slight right foraminal narrowing at C5-6. 2. Solid anterior fusion at C4-5 with neural impingement.   Electronically Signed   By: Lorriane Shire M.D.   On: 01/25/2019 17:06

## 2019-02-09 NOTE — Progress Notes (Signed)
Office Visit Note   Patient: Erik Cook           Date of Birth: 10/17/1960           MRN: [id number] Visit Date: 02/09/2019              Requested by: No referring provider defined for this encounter. PCP: Patient, No Pcp Per   Assessment & Plan: Visit Diagnoses:  1. Spinal stenosis of lumbar region with neurogenic claudication   2. Bilateral occipital neuralgia     Plan: CLINICAL DATA:  Neck pain with bilateral arm pain and weakness and numbness. Previous anterior cervical fusion at C4-5.  EXAM: MRI CERVICAL SPINE WITHOUT CONTRAST  TECHNIQUE: Multiplanar, multisequence MR imaging of the cervical spine was performed. No intravenous contrast was administered.  COMPARISON:  Radiographs dated 01/05/2019  FINDINGS: Alignment: 1.5 mm retrolisthesis of C6 on C7.  Otherwise normal.  Vertebrae: Solid anterior fusion at C4-5. No fracture or discitis or significant bone lesion.  Cord: Normal signal and morphology.  Posterior Fossa, vertebral arteries, paraspinal tissues: Negative.  Disc levels:  C2-3: Normal disc. Minimal degenerative changes of the facet joints. Widely patent neural foramina.  C3-4: Normal disc. Minimal degenerative changes of the facet joints. Widely patent neural foramina.  C4-5: Solid anterior fusion. Widely patent neural foramina. No neural impingement.  C5-6: Disc space narrowing. Small disc bulges to the right and left of midline more prominent to the right with slight right foraminal narrowing.  C6-7: Disc space narrowing. Osteophytes protrude into both neural foramina creating mild bilateral foraminal narrowing, left more than right.  C7-T1: Normal.  IMPRESSION: 1. Degenerative disc disease at C5-6 and C6-7 with slight bilateral foraminal narrowing at C6-7 and slight right foraminal narrowing at C5-6. 2. Solid anterior fusion at C4-5 with neural impingement.   Electronically Signed   By: Francene BoyersJames  Maxwell M.D.  On: 01/25/2019 17:06   Follow-Up Instructions: Return in about 4 weeks (around 03/09/2019).   Orders:  No orders of the defined types were placed in this encounter.  No orders of the defined types were placed in this encounter.     Procedures: No procedures performed   Clinical Data: Findings:  CLINICAL DATA:  Neck pain with bilateral arm pain and weakness and numbness. Previous anterior cervical fusion at C4-5.  EXAM: MRI CERVICAL SPINE WITHOUT CONTRAST  TECHNIQUE: Multiplanar, multisequence MR imaging of the cervical spine was performed. No intravenous contrast was administered.  COMPARISON:  Radiographs dated 01/05/2019  FINDINGS: Alignment: 1.5 mm retrolisthesis of C6 on C7.  Otherwise normal.  Vertebrae: Solid anterior fusion at C4-5. No fracture or discitis or significant bone lesion.  Cord: Normal signal and morphology.  Posterior Fossa, vertebral arteries, paraspinal tissues: Negative.  Disc levels:  C2-3: Normal disc. Minimal degenerative changes of the facet joints. Widely patent neural foramina.  C3-4: Normal disc. Minimal degenerative changes of the facet joints. Widely patent neural foramina.  C4-5: Solid anterior fusion. Widely patent neural foramina. No neural impingement.  C5-6: Disc space narrowing. Small disc bulges to the right and left of midline more prominent to the right with slight right foraminal narrowing.  C6-7: Disc space narrowing. Osteophytes protrude into both neural foramina creating mild bilateral foraminal narrowing, left more than right.  C7-T1: Normal.  IMPRESSION: 1. Degenerative disc disease at C5-6 and C6-7 with slight bilateral foraminal narrowing at C6-7 and slight right foraminal narrowing at C5-6. 2. Solid anterior fusion at C4-5 with neural impingement.   Electronically Signed  By: Lorriane Shire M.D.   On: 01/25/2019 17:06     Subjective: Chief Complaint  Patient presents with   . Neck - Follow-up  . Right Shoulder - Follow-up  . Left Shoulder - Follow-up    58 year old male right handed with history of neck and low back pain. He underwent C4-5 ACDF 10/2012 by Dr. Rolena Infante and he has right foramenal stenosis. He had pain in the neck and radiation into the left shoulder and arm prior to ACDF. Now with similar pain into the right shoulder right arm and right hand pain and numbness. No bowel or bladder difficulty. Pain in the posterior occiput with extension of the neck and at the base of the skull.  Recent MRI.    Review of Systems  Constitutional: Negative.   HENT: Negative.   Eyes: Positive for visual disturbance.  Respiratory: Negative.   Cardiovascular: Positive for chest pain.  Gastrointestinal: Negative.   Endocrine: Negative.   Genitourinary: Negative.   Musculoskeletal: Positive for back pain. Negative for gait problem, neck pain and neck stiffness.  Skin: Negative.  Negative for color change, pallor, rash and wound.  Allergic/Immunologic: Negative.   Neurological: Positive for weakness and numbness. Negative for tremors, seizures, syncope and speech difficulty.  Hematological: Negative for adenopathy. Does not bruise/bleed easily.  Psychiatric/Behavioral: Negative for agitation, behavioral problems, confusion, decreased concentration, dysphoric mood, hallucinations, self-injury, sleep disturbance and suicidal ideas. The patient is not nervous/anxious and is not hyperactive.      Objective: Vital Signs: BP 113/70 (BP Location: Left Arm, Patient Position: Sitting)   Pulse 75   Ht 5\' 9"  (1.753 m)   Wt 200 lb (90.7 kg)   BMI 29.53 kg/m   Physical Exam  Back Exam   Tenderness  The patient is experiencing tenderness in the lumbar and cervical.  Range of Motion  Extension: abnormal  Flexion: normal  Lateral bend right: abnormal  Lateral bend left: abnormal  Rotation right: abnormal  Rotation left: abnormal   Muscle Strength  Right  Quadriceps:  5/5  Left Quadriceps:  5/5  Right Hamstrings:  5/5  Left Hamstrings:  5/5   Tests  Straight leg raise right: negative Straight leg raise left: negative  Reflexes  Patellar: normal Achilles: normal Babinski's sign: normal   Other  Toe walk: normal Heel walk: normal Sensation: normal Gait: normal  Erythema: no back redness Scars: absent      Specialty Comments:  No specialty comments available.  Imaging: No results found.   PMFS History: There are no active problems to display for this patient.  Past Medical History:  Diagnosis Date  . Hypertension     History reviewed. No pertinent family history.  Past Surgical History:  Procedure Laterality Date  . ANTERIOR CERVICAL DECOMP/DISCECTOMY FUSION N/A 10/05/2012   Procedure: ANTERIOR CERVICAL DECOMPRESSION/DISCECTOMY FUSION 1 LEVEL;  Surgeon: Melina Schools, MD;  Location: [facility];  Service: Orthopedics;  Laterality: N/A;  TOTAL DISC REPLACEMENT VERSES ACDF C4-5  . KNEE ARTHROSCOPY     left  . KNEE ARTHROSCOPY     on left side again  . ROTATOR CUFF REPAIR     left  . ROTATOR CUFF REPAIR     2nd time around (after fall)   Social History   Occupational History  . Not on file  Tobacco Use  . Smoking status: Never Smoker  . Smokeless tobacco: Never Used  Substance and Sexual Activity  . Alcohol use: No  . Drug use: No  .  Sexual activity: Not on file

## 2019-03-06 ENCOUNTER — Ambulatory Visit (INDEPENDENT_AMBULATORY_CARE_PROVIDER_SITE_OTHER): Payer: Medicare HMO | Admitting: Physical Medicine and Rehabilitation

## 2019-03-06 ENCOUNTER — Ambulatory Visit: Payer: Self-pay

## 2019-03-06 ENCOUNTER — Other Ambulatory Visit: Payer: Self-pay

## 2019-03-06 ENCOUNTER — Encounter

## 2019-03-06 ENCOUNTER — Encounter: Payer: Self-pay | Admitting: Physical Medicine and Rehabilitation

## 2019-03-06 VITALS — BP 159/79 | HR 66

## 2019-03-06 DIAGNOSIS — M5010 Cervical disc disorder with radiculopathy, unspecified cervical region: Secondary | ICD-10-CM | POA: Diagnosis not present

## 2019-03-06 MED ORDER — METHYLPREDNISOLONE ACETATE 80 MG/ML IJ SUSP
80.0000 mg | Freq: Once | INTRAMUSCULAR | Status: AC
  Administered 2019-03-06: 80 mg

## 2019-03-06 NOTE — Progress Notes (Signed)
 .  Numeric Pain Rating Scale and Functional Assessment Average Pain 8   In the last MONTH (on 0-10 scale) has pain interfered with the following?  1. General activity like being  able to carry out your everyday physical activities such as walking, climbing stairs, carrying groceries, or moving a chair?  Rating(8)   +Driver, -BT, -Dye Allergies.  

## 2019-03-06 NOTE — Procedures (Signed)
Cervical Epidural Steroid Injection - Interlaminar Approach with Fluoroscopic Guidance  Patient: Erik Cook      Date of Birth: 1961-06-01 MRN: [id number] PCP: Patient, No Pcp Per      Visit Date: 03/06/2019   Universal Protocol:    Date/Time: 03/05/2009:05 AM  Consent Given By: the patient  Position: PRONE  Additional Comments: Vital signs were monitored before and after the procedure. Patient was prepped and draped in the usual sterile fashion. The correct patient, procedure, and site was verified.   Injection Procedure Details:  Procedure Site One Meds Administered:  Meds ordered this encounter  Medications  . methylPREDNISolone acetate (DEPO-MEDROL) injection 80 mg     Laterality: Left  Location/Site: C7-T1  Needle size: 20 G  Needle type: Touhy  Needle Placement: Paramedian epidural space  Findings:  -Comments: Excellent flow of contrast into the epidural space.  Procedure Details: Using a paramedian approach from the side mentioned above, the region overlying the inferior lamina was localized under fluoroscopic visualization and the soft tissues overlying this structure were infiltrated with 4 ml. of 1% Lidocaine without Epinephrine. A # 20 gauge, Tuohy needle was inserted into the epidural space using a paramedian approach.  The epidural space was localized using loss of resistance along with lateral and contralateral oblique bi-planar fluoroscopic views.  After negative aspirate for air, blood, and CSF, a 2 ml. volume of Isovue-250 was injected into the epidural space and the flow of contrast was observed. Radiographs were obtained for documentation purposes.   The injectate was administered into the level noted above.  Additional Comments:  No complications occurred Dressing: 2 x 2 sterile gauze and Band-Aid    Post-procedure details: Patient was observed during the procedure. Post-procedure instructions were reviewed.  Patient left the clinic in  stable condition.

## 2019-03-06 NOTE — Progress Notes (Signed)
Erik Cook - 58 y.o. male MRN [id number]  Date of birth: 09/25/1960  Office Visit Note: Visit Date: 03/06/2019 PCP: Patient, No Pcp Per Referred by: Jessy Oto, MD  Subjective: Chief Complaint  Patient presents with  . Neck - Pain  . Right Shoulder - Pain  . Left Shoulder - Pain  . Right Arm - Pain  . Left Arm - Pain  . Left Hand - Pain  . Right Hand - Pain   HPI:  Erik Cook is a 58 y.o. male who comes in today For planned repeat C7-T1 interlaminar epidural steroid injection as requested by Dr. Basil Dess.  Patient had prior injection in January of this year with good relief for several months.  We have also seen him for his lower back.  He has had prior C4-5 ACDF.  Is having neck pain with bilateral shoulder and arm pain.  We will complete injection today diagnostically and hopefully therapeutically.  He is failed conservative care otherwise.  ROS Otherwise per HPI.  Assessment & Plan: Visit Diagnoses:  1. Cervical disc disorder with radiculopathy     Plan: No additional findings.   Meds & Orders:  Meds ordered this encounter  Medications  . methylPREDNISolone acetate (DEPO-MEDROL) injection 80 mg    Orders Placed This Encounter  Procedures  . XR C-ARM NO REPORT  . Epidural Steroid injection    Follow-up: Return if symptoms worsen or fail to improve.   Procedures: No procedures performed  Cervical Epidural Steroid Injection - Interlaminar Approach with Fluoroscopic Guidance  Patient: Erik Cook      Date of Birth: [birth year] MRN: [id number] PCP: Patient, No Pcp Per      Visit Date: 03/06/2019   Universal Protocol:    Date/Time: 03/05/2009:05 AM  Consent Given By: the patient  Position: PRONE  Additional Comments: Vital signs were monitored before and after the procedure. Patient was prepped and draped in the usual sterile fashion. The correct patient, procedure, and site was verified.   Injection Procedure Details:  Procedure Site  One Meds Administered:  Meds ordered this encounter  Medications  . methylPREDNISolone acetate (DEPO-MEDROL) injection 80 mg     Laterality: Left  Location/Site: C7-T1  Needle size: 20 G  Needle type: Touhy  Needle Placement: Paramedian epidural space  Findings:  -Comments: Excellent flow of contrast into the epidural space.  Procedure Details: Using a paramedian approach from the side mentioned above, the region overlying the inferior lamina was localized under fluoroscopic visualization and the soft tissues overlying this structure were infiltrated with 4 ml. of 1% Lidocaine without Epinephrine. A # 20 gauge, Tuohy needle was inserted into the epidural space using a paramedian approach.  The epidural space was localized using loss of resistance along with lateral and contralateral oblique bi-planar fluoroscopic views.  After negative aspirate for air, blood, and CSF, a 2 ml. volume of Isovue-250 was injected into the epidural space and the flow of contrast was observed. Radiographs were obtained for documentation purposes.   The injectate was administered into the level noted above.  Additional Comments:  No complications occurred Dressing: 2 x 2 sterile gauze and Band-Aid    Post-procedure details: Patient was observed during the procedure. Post-procedure instructions were reviewed.  Patient left the clinic in stable condition.    Clinical History: MRI CERVICAL SPINE WITHOUT CONTRAST  TECHNIQUE: Multiplanar, multisequence MR imaging of the cervical spine was performed. No intravenous contrast was administered.  COMPARISON:  Radiographs dated 01/05/2019  FINDINGS: Alignment:  1.5 mm retrolisthesis of C6 on C7.  Otherwise normal.  Vertebrae: Solid anterior fusion at C4-5. No fracture or discitis or significant bone lesion.  Cord: Normal signal and morphology.  Posterior Fossa, vertebral arteries, paraspinal tissues: Negative.  Disc levels:  C2-3:  Normal disc. Minimal degenerative changes of the facet joints. Widely patent neural foramina.  C3-4: Normal disc. Minimal degenerative changes of the facet joints. Widely patent neural foramina.  C4-5: Solid anterior fusion. Widely patent neural foramina. No neural impingement.  C5-6: Disc space narrowing. Small disc bulges to the right and left of midline more prominent to the right with slight right foraminal narrowing.  C6-7: Disc space narrowing. Osteophytes protrude into both neural foramina creating mild bilateral foraminal narrowing, left more than right.  C7-T1: Normal.  IMPRESSION: 1. Degenerative disc disease at C5-6 and C6-7 with slight bilateral foraminal narrowing at C6-7 and slight right foraminal narrowing at C5-6. 2. Solid anterior fusion at C4-5 with neural impingement.   Electronically Signed   By: Francene BoyersJames  Maxwell M.D.   On: 01/25/2019 17:06  -------------------------- MRI LUMBAR SPINE WITHOUT CONTRAST  TECHNIQUE: Multiplanar, multisequence MR imaging of the lumbar spine was performed. No intravenous contrast was administered.  COMPARISON: Radiography 12/30/2017  FINDINGS: Segmentation: 5 lumbar type vertebral bodies  Alignment: Mild anterolisthesis at L5-S1  Vertebrae: No fracture, evidence of discitis, or aggressive bone lesion. L2 hemangioma  Conus medullaris and cauda equina: Conus extends to the L1 level. Conus and cauda equina appear normal.  Paraspinal and other soft tissues: Negative  Disc levels:  T12- L1: Unremarkable.  L1-L2: Mild facet hypertrophy. No impingement  L2-L3: Disc narrowing and bulging. Mild facet hypertrophy. Noncompressive spinal canal and foraminal narrowing.  L3-L4: Disc narrowing and bulging. Mild facet spurring. Right foraminal impingement. Mild spinal stenosis.  L4-L5: Disc narrowing and bulging with endplate spurring. Negative facets. Noncompressive bilateral foraminal narrowing.  Mild spinal stenosis  L5-S1:Mild degenerative facet spurring. Mild disc bulging. There is left subarticular recess narrowing without convincing S1 impingement.  IMPRESSION: 1. Disc narrowing and bulging at L2-3 to L4-5 with mild spinal stenosis. 2. L3-4 moderate to advanced right foraminal stenosis. There is noncompressive foraminal narrowing the other levels of disc degeneration.   Electronically Signed By: Marnee SpringJonathon Watts M.D. On: 04/17/2018 10:18     Objective:  VS:  HT:    WT:   BMI:     BP:(!) 159/79  HR:66bpm  TEMP: ( )  RESP:  Physical Exam  Ortho Exam Imaging: Xr C-arm No Report  Result Date: 03/06/2019 Please see Notes tab for imaging impression.

## 2019-03-20 ENCOUNTER — Ambulatory Visit: Payer: Self-pay

## 2019-03-20 ENCOUNTER — Ambulatory Visit (INDEPENDENT_AMBULATORY_CARE_PROVIDER_SITE_OTHER): Payer: Medicare HMO | Admitting: Physical Medicine and Rehabilitation

## 2019-03-20 ENCOUNTER — Encounter: Payer: Self-pay | Admitting: Physical Medicine and Rehabilitation

## 2019-03-20 VITALS — BP 131/82 | HR 76

## 2019-03-20 DIAGNOSIS — M5416 Radiculopathy, lumbar region: Secondary | ICD-10-CM | POA: Diagnosis not present

## 2019-03-20 MED ORDER — BETAMETHASONE SOD PHOS & ACET 6 (3-3) MG/ML IJ SUSP
12.0000 mg | Freq: Once | INTRAMUSCULAR | Status: AC
  Administered 2019-03-20: 12 mg

## 2019-03-20 NOTE — Progress Notes (Signed)
 .  Numeric Pain Rating Scale and Functional Assessment Average Pain 8   In the last MONTH (on 0-10 scale) has pain interfered with the following?  1. General activity like being  able to carry out your everyday physical activities such as walking, climbing stairs, carrying groceries, or moving a chair?  Rating(6)   +Driver, -BT, -Dye Allergies.  

## 2019-03-21 NOTE — Progress Notes (Signed)
Erik Cook - 58 y.o. male MRN [id number]  Date of birth: 03-09-1961  Office Visit Note: Visit Date: 03/20/2019 PCP: Patient, No Pcp Per Referred by: No ref. provider found  Subjective: Chief Complaint  Patient presents with  . Lower Back - Pain  . Left Leg - Pain   HPI:  Erik Cook is a 58 y.o. male who comes in today At the request of Dr. Basil Dess for repeat right L3 transforaminal injection and left L5-S1 interlaminar epidural steroid injection.  Patient had similar injection in February and claims that he got pretty decent relief from the injection in fact he still getting some relief from it but the pain is starting to come back.  He has had no other red flag complaints or new symptoms.  He has had no focal weakness.  He is failed conservative care otherwise and Dr. Malachy Mood notes can be reviewed.  ROS Otherwise per HPI.  Assessment & Plan: Visit Diagnoses:  1. Lumbar radiculopathy     Plan: No additional findings.   Meds & Orders:  Meds ordered this encounter  Medications  . betamethasone acetate-betamethasone sodium phosphate (CELESTONE) injection 12 mg    Orders Placed This Encounter  Procedures  . XR C-ARM NO REPORT  . Epidural Steroid injection    Follow-up: Return for Basil Dess, M.D..   Procedures: No procedures performed  Lumbosacral Transforaminal Epidural Steroid Injection - Sub-Pedicular Approach with Fluoroscopic Guidance  Patient: Erik Cook      Date of Birth: 05-06-1961 MRN: [id number] PCP: Patient, No Pcp Per      Visit Date: 03/20/2019   Universal Protocol:    Date/Time: 03/20/2019  Consent Given By: the patient  Position: PRONE  Additional Comments: Vital signs were monitored before and after the procedure. Patient was prepped and draped in the usual sterile fashion. The correct patient, procedure, and site was verified.   Injection Procedure Details:  Procedure Site One Meds Administered:  Meds ordered this encounter   Medications  . betamethasone acetate-betamethasone sodium phosphate (CELESTONE) injection 12 mg    Laterality: Right  Location/Site:  L3-L4  Needle size: 22 G  Needle type: Spinal  Needle Placement: Transforaminal  Findings:    -Comments: Excellent flow of contrast along the nerve and into the epidural space.  Procedure Details: After squaring off the end-plates to get a true AP view, the C-arm was positioned so that an oblique view of the foramen as noted above was visualized. The target area is just inferior to the "nose of the scotty dog" or sub pedicular. The soft tissues overlying this structure were infiltrated with 2-3 ml. of 1% Lidocaine without Epinephrine.  The spinal needle was inserted toward the target using a "trajectory" view along the fluoroscope beam.  Under AP and lateral visualization, the needle was advanced so it did not puncture dura and was located close the 6 O'Clock position of the pedical in AP tracterory. Biplanar projections were used to confirm position. Aspiration was confirmed to be negative for CSF and/or blood. A 1-2 ml. volume of Isovue-250 was injected and flow of contrast was noted at each level. Radiographs were obtained for documentation purposes.   After attaining the desired flow of contrast documented above, a 0.5 to 1.0 ml test dose of 0.25% Marcaine was injected into each respective transforaminal space.  The patient was observed for 90 seconds post injection.  After no sensory deficits were reported, and normal lower extremity motor function was noted,   the above injectate  was administered so that equal amounts of the injectate were placed at each foramen (level) into the transforaminal epidural space.   Additional Comments:  The patient tolerated the procedure well Dressing: 2 x 2 sterile gauze and Band-Aid    Post-procedure details: Patient was observed during the procedure. Post-procedure instructions were reviewed.  Patient left  the clinic in stable condition.  Lumbar Epidural Steroid Injection - Interlaminar Approach with Fluoroscopic Guidance  Patient: Erik Cook      Date of Birth: 05/07/1961 MRN: [id number] PCP: Patient, No Pcp Per      Visit Date: 03/20/2019   Universal Protocol:     Consent Given By: the patient  Position: PRONE  Additional Comments: Vital signs were monitored before and after the procedure. Patient was prepped and draped in the usual sterile fashion. The correct patient, procedure, and site was verified.   Injection Procedure Details:  Procedure Site One Meds Administered:  Meds ordered this encounter  Medications  . betamethasone acetate-betamethasone sodium phosphate (CELESTONE) injection 12 mg     Laterality: Left  Location/Site:  L5-S1  Needle size: 20 G  Needle type: Tuohy  Needle Placement: Paramedian epidural  Findings:   -Comments: Excellent flow of contrast into the epidural space.  Procedure Details: Using a paramedian approach from the side mentioned above, the region overlying the inferior lamina was localized under fluoroscopic visualization and the soft tissues overlying this structure were infiltrated with 4 ml. of 1% Lidocaine without Epinephrine. The Tuohy needle was inserted into the epidural space using a paramedian approach.   The epidural space was localized using loss of resistance along with lateral and bi-planar fluoroscopic views.  After negative aspirate for air, blood, and CSF, a 2 ml. volume of Isovue-250 was injected into the epidural space and the flow of contrast was observed. Radiographs were obtained for documentation purposes.    The injectate was administered into the level noted above.   Additional Comments:  The patient tolerated the procedure well Dressing: 2 x 2 sterile gauze and Band-Aid    Post-procedure details: Patient was observed during the procedure. Post-procedure instructions were reviewed.  Patient left the  clinic in stable condition.    Clinical History: MRI CERVICAL SPINE WITHOUT CONTRAST  TECHNIQUE: Multiplanar, multisequence MR imaging of the cervical spine was performed. No intravenous contrast was administered.  COMPARISON:  Radiographs dated 01/05/2019  FINDINGS: Alignment: 1.5 mm retrolisthesis of C6 on C7.  Otherwise normal.  Vertebrae: Solid anterior fusion at C4-5. No fracture or discitis or significant bone lesion.  Cord: Normal signal and morphology.  Posterior Fossa, vertebral arteries, paraspinal tissues: Negative.  Disc levels:  C2-3: Normal disc. Minimal degenerative changes of the facet joints. Widely patent neural foramina.  C3-4: Normal disc. Minimal degenerative changes of the facet joints. Widely patent neural foramina.  C4-5: Solid anterior fusion. Widely patent neural foramina. No neural impingement.  C5-6: Disc space narrowing. Small disc bulges to the right and left of midline more prominent to the right with slight right foraminal narrowing.  C6-7: Disc space narrowing. Osteophytes protrude into both neural foramina creating mild bilateral foraminal narrowing, left more than right.  C7-T1: Normal.  IMPRESSION: 1. Degenerative disc disease at C5-6 and C6-7 with slight bilateral foraminal narrowing at C6-7 and slight right foraminal narrowing at C5-6. 2. Solid anterior fusion at C4-5 with neural impingement.   Electronically Signed   By: Francene BoyersJames  Maxwell M.D.   On: 01/25/2019 17:06  -------------------------- MRI LUMBAR SPINE WITHOUT CONTRAST  TECHNIQUE: Multiplanar,  multisequence MR imaging of the lumbar spine was performed. No intravenous contrast was administered.  COMPARISON: Radiography 12/30/2017  FINDINGS: Segmentation: 5 lumbar type vertebral bodies  Alignment: Mild anterolisthesis at L5-S1  Vertebrae: No fracture, evidence of discitis, or aggressive bone lesion. L2 hemangioma  Conus medullaris  and cauda equina: Conus extends to the L1 level. Conus and cauda equina appear normal.  Paraspinal and other soft tissues: Negative  Disc levels:  T12- L1: Unremarkable.  L1-L2: Mild facet hypertrophy. No impingement  L2-L3: Disc narrowing and bulging. Mild facet hypertrophy. Noncompressive spinal canal and foraminal narrowing.  L3-L4: Disc narrowing and bulging. Mild facet spurring. Right foraminal impingement. Mild spinal stenosis.  L4-L5: Disc narrowing and bulging with endplate spurring. Negative facets. Noncompressive bilateral foraminal narrowing. Mild spinal stenosis  L5-S1:Mild degenerative facet spurring. Mild disc bulging. There is left subarticular recess narrowing without convincing S1 impingement.  IMPRESSION: 1. Disc narrowing and bulging at L2-3 to L4-5 with mild spinal stenosis. 2. L3-4 moderate to advanced right foraminal stenosis. There is noncompressive foraminal narrowing the other levels of disc degeneration.   Electronically Signed By: Marnee SpringJonathon Watts M.D. On: 04/17/2018 10:18     Objective:  VS:  HT:    WT:   BMI:     BP:131/82  HR:76bpm  TEMP: ( )  RESP:  Physical Exam  Ortho Exam Imaging: Xr C-arm No Report  Result Date: 03/20/2019 Please see Notes tab for imaging impression.

## 2019-03-21 NOTE — Procedures (Signed)
Lumbosacral Transforaminal Epidural Steroid Injection - Sub-Pedicular Approach with Fluoroscopic Guidance  Patient: Erik Cook      Date of Birth: 09/01/1960 MRN: [id number] PCP: Patient, No Pcp Per      Visit Date: 03/20/2019   Universal Protocol:    Date/Time: 03/20/2019  Consent Given By: the patient  Position: PRONE  Additional Comments: Vital signs were monitored before and after the procedure. Patient was prepped and draped in the usual sterile fashion. The correct patient, procedure, and site was verified.   Injection Procedure Details:  Procedure Site One Meds Administered:  Meds ordered this encounter  Medications  . betamethasone acetate-betamethasone sodium phosphate (CELESTONE) injection 12 mg    Laterality: Right  Location/Site:  L3-L4  Needle size: 22 G  Needle type: Spinal  Needle Placement: Transforaminal  Findings:    -Comments: Excellent flow of contrast along the nerve and into the epidural space.  Procedure Details: After squaring off the end-plates to get a true AP view, the C-arm was positioned so that an oblique view of the foramen as noted above was visualized. The target area is just inferior to the "nose of the scotty dog" or sub pedicular. The soft tissues overlying this structure were infiltrated with 2-3 ml. of 1% Lidocaine without Epinephrine.  The spinal needle was inserted toward the target using a "trajectory" view along the fluoroscope beam.  Under AP and lateral visualization, the needle was advanced so it did not puncture dura and was located close the 6 O'Clock position of the pedical in AP tracterory. Biplanar projections were used to confirm position. Aspiration was confirmed to be negative for CSF and/or blood. A 1-2 ml. volume of Isovue-250 was injected and flow of contrast was noted at each level. Radiographs were obtained for documentation purposes.   After attaining the desired flow of contrast documented above, a 0.5 to  1.0 ml test dose of 0.25% Marcaine was injected into each respective transforaminal space.  The patient was observed for 90 seconds post injection.  After no sensory deficits were reported, and normal lower extremity motor function was noted,   the above injectate was administered so that equal amounts of the injectate were placed at each foramen (level) into the transforaminal epidural space.   Additional Comments:  The patient tolerated the procedure well Dressing: 2 x 2 sterile gauze and Band-Aid    Post-procedure details: Patient was observed during the procedure. Post-procedure instructions were reviewed.  Patient left the clinic in stable condition.  Lumbar Epidural Steroid Injection - Interlaminar Approach with Fluoroscopic Guidance  Patient: Erik Cook      Date of Birth: 1960/08/06 MRN: [id number] PCP: Patient, No Pcp Per      Visit Date: 03/20/2019   Universal Protocol:     Consent Given By: the patient  Position: PRONE  Additional Comments: Vital signs were monitored before and after the procedure. Patient was prepped and draped in the usual sterile fashion. The correct patient, procedure, and site was verified.   Injection Procedure Details:  Procedure Site One Meds Administered:  Meds ordered this encounter  Medications  . betamethasone acetate-betamethasone sodium phosphate (CELESTONE) injection 12 mg     Laterality: Left  Location/Site:  L5-S1  Needle size: 20 G  Needle type: Tuohy  Needle Placement: Paramedian epidural  Findings:   -Comments: Excellent flow of contrast into the epidural space.  Procedure Details: Using a paramedian approach from the side mentioned above, the region overlying the inferior lamina was localized under fluoroscopic visualization and  the soft tissues overlying this structure were infiltrated with 4 ml. of 1% Lidocaine without Epinephrine. The Tuohy needle was inserted into the epidural space using a paramedian  approach.   The epidural space was localized using loss of resistance along with lateral and bi-planar fluoroscopic views.  After negative aspirate for air, blood, and CSF, a 2 ml. volume of Isovue-250 was injected into the epidural space and the flow of contrast was observed. Radiographs were obtained for documentation purposes.    The injectate was administered into the level noted above.   Additional Comments:  The patient tolerated the procedure well Dressing: 2 x 2 sterile gauze and Band-Aid    Post-procedure details: Patient was observed during the procedure. Post-procedure instructions were reviewed.  Patient left the clinic in stable condition.

## 2019-03-30 ENCOUNTER — Ambulatory Visit: Payer: Medicare HMO | Admitting: Specialist

## 2019-04-19 ENCOUNTER — Telehealth: Payer: Self-pay | Admitting: Specialist

## 2019-04-19 NOTE — Telephone Encounter (Signed)
Returned call to patient left message to call back to R/S his appointment per his request

## 2019-04-20 ENCOUNTER — Ambulatory Visit: Payer: Medicare HMO | Admitting: Specialist

## 2019-05-01 ENCOUNTER — Other Ambulatory Visit: Payer: Self-pay | Admitting: Specialist

## 2019-05-12 ENCOUNTER — Encounter: Payer: Self-pay | Admitting: Specialist

## 2019-05-12 ENCOUNTER — Ambulatory Visit (INDEPENDENT_AMBULATORY_CARE_PROVIDER_SITE_OTHER): Payer: Medicare HMO | Admitting: Specialist

## 2019-05-12 ENCOUNTER — Other Ambulatory Visit: Payer: Self-pay

## 2019-05-12 VITALS — BP 134/92 | HR 58 | Ht 69.0 in | Wt 200.0 lb

## 2019-05-12 DIAGNOSIS — M4722 Other spondylosis with radiculopathy, cervical region: Secondary | ICD-10-CM

## 2019-05-12 DIAGNOSIS — M48062 Spinal stenosis, lumbar region with neurogenic claudication: Secondary | ICD-10-CM

## 2019-05-12 DIAGNOSIS — M4322 Fusion of spine, cervical region: Secondary | ICD-10-CM | POA: Diagnosis not present

## 2019-05-12 NOTE — Progress Notes (Signed)
58 year old male returns after having cervical ESI with Dr. Ernestina Patches.  Had left C7-T1 interlaminar March 06, 2019 and right L3-4 transforaminal on March 20, 2019.  States that his neck did well and still getting good response.  He states that he also continues to have problems with his low back where he gets "strange sensations in his legs".  Patient also does have a history of lumbar spondylosis with neurogenic claudication.  He is adamant about not wanting any more surgical procedures for his neck or anything surgical on his back.  I do not see a recent lumbar MRI in the system.  He continues describe having lumbar neurogenic claudication symptoms.  Sounds like this may be getting worse.  Patient also mentions chest pain that he has been having.  "Not sure if it is angina or acid reflux".  States that he has not had this evaluated.  He thinks that the chest pain could also be related to his neck.  I assured him that is not the case.   Exam Pleasant male alert and oriented in no acute distress.  Gait is normal.  Neurovascular intact.  No focal motor deficits upper and lower extremities.  Plan Since patient states that he is doing well at this time after having recent cervical ESI's I will have him return to the clinic in 3 months to see Dr. Louanne Skye.  Again he states that he does not want any surgical procedures for his neck or low back.  If he would like to get a new imaging study of his lumbar spine before return office visit in 3 months he will call let me know.  I also did advise him that he needs to follow-up with his primary care physician soon about the chest pain that he is describing.  He voiced understanding.

## 2019-06-01 ENCOUNTER — Telehealth: Payer: Self-pay | Admitting: Physical Medicine and Rehabilitation

## 2019-06-01 NOTE — Telephone Encounter (Signed)
He does not have any compressive stenosis in his lumbar spine he does have some mild arthritis.  We could try diagnostic medial branch blocks for his lower back and discussed radiofrequency ablation with him.  He could talk to him briefly about that until and I can go over that when I see him.  Otherwise he should follow-up with Dr. Louanne Skye.

## 2019-06-02 ENCOUNTER — Telehealth: Payer: Self-pay | Admitting: Physical Medicine and Rehabilitation

## 2019-06-02 ENCOUNTER — Other Ambulatory Visit: Payer: Self-pay | Admitting: Specialist

## 2019-06-02 MED ORDER — TRAMADOL HCL 50 MG PO TABS: 50.0000 mg | ORAL_TABLET | Freq: Four times a day (QID) | ORAL | 0 refills | Status: DC | PRN

## 2019-06-02 MED ORDER — BACLOFEN 10 MG PO TABS: 10.0000 mg | ORAL_TABLET | Freq: Three times a day (TID) | ORAL | 0 refills | Status: DC

## 2019-06-02 NOTE — Telephone Encounter (Signed)
Left message #1

## 2019-06-02 NOTE — Telephone Encounter (Signed)
See other message

## 2019-06-02 NOTE — Telephone Encounter (Signed)
Send request pain meds and muscle relaxer to Dr. Louanne Skye for approval, pt to check with pharm later

## 2019-06-02 NOTE — Telephone Encounter (Signed)
Pt called in requesting a call back in regards to your lower back pain and an appointment with dr.nitka that he had on 11/9, he is very upset and is wanting to speak to dr.nitka or christy asap or will be coming in.    [phone]

## 2019-06-02 NOTE — Telephone Encounter (Signed)
Scheduled for 11/19 with driver.

## 2019-06-22 ENCOUNTER — Ambulatory Visit: Payer: Self-pay

## 2019-06-22 ENCOUNTER — Other Ambulatory Visit: Payer: Self-pay

## 2019-06-22 ENCOUNTER — Encounter: Payer: Self-pay | Admitting: Physical Medicine and Rehabilitation

## 2019-06-22 ENCOUNTER — Telehealth: Payer: Self-pay | Admitting: *Deleted

## 2019-06-22 ENCOUNTER — Ambulatory Visit: Payer: Medicare HMO | Admitting: Physical Medicine and Rehabilitation

## 2019-06-22 VITALS — BP 140/88 | HR 72

## 2019-06-22 DIAGNOSIS — M5416 Radiculopathy, lumbar region: Secondary | ICD-10-CM

## 2019-06-22 MED ORDER — BETAMETHASONE SOD PHOS & ACET 6 (3-3) MG/ML IJ SUSP
12.0000 mg | Freq: Once | INTRAMUSCULAR | Status: AC
  Administered 2019-06-22: 12 mg

## 2019-06-22 NOTE — Progress Notes (Signed)
 .  Numeric Pain Rating Scale and Functional Assessment Average Pain 6   In the last MONTH (on 0-10 scale) has pain interfered with the following?  1. General activity like being  able to carry out your everyday physical activities such as walking, climbing stairs, carrying groceries, or moving a chair?  Rating(6)   +Driver, -BT, -Dye Allergies.  

## 2019-06-22 NOTE — Telephone Encounter (Signed)
Service SUPJS:[id number] Authorization [id number] Auth Effective Date:11/19/2020Auth End Date:05/18/2021Initiated Date:11/19/2020Decision Date:11/19/2020Decision Type :InitialCase Status:Approved

## 2019-08-29 ENCOUNTER — Telehealth: Payer: Self-pay | Admitting: Physical Medicine and Rehabilitation

## 2019-08-29 NOTE — Telephone Encounter (Signed)
Yes ok 

## 2019-08-30 NOTE — Telephone Encounter (Signed)
Is auth needed for [id number]? [employer]. Scheduled for 2/10 with driver and no blood thinner.

## 2019-08-30 NOTE — Telephone Encounter (Signed)
Service [phone] Authorization SUORVI:[id number] Auth Effective Date:01/27/2021Auth End Date:07/26/2021Initiated Date:01/27/2021Decision Date:01/27/2021Decision Type :InitialCase Status:Approved

## 2019-09-13 ENCOUNTER — Ambulatory Visit: Payer: Self-pay

## 2019-09-13 ENCOUNTER — Ambulatory Visit: Payer: Medicare HMO | Admitting: Physical Medicine and Rehabilitation

## 2019-09-13 ENCOUNTER — Other Ambulatory Visit: Payer: Self-pay

## 2019-09-13 ENCOUNTER — Encounter: Payer: Self-pay | Admitting: Physical Medicine and Rehabilitation

## 2019-09-13 VITALS — BP 126/71 | HR 93

## 2019-09-13 DIAGNOSIS — M5010 Cervical disc disorder with radiculopathy, unspecified cervical region: Secondary | ICD-10-CM | POA: Diagnosis not present

## 2019-09-13 MED ORDER — METHYLPREDNISOLONE ACETATE 80 MG/ML IJ SUSP
40.0000 mg | Freq: Once | INTRAMUSCULAR | Status: AC
  Administered 2019-09-13: 13:00:00 40 mg

## 2019-09-13 NOTE — Progress Notes (Signed)
.  Numeric Pain Rating Scale and Functional Assessment Average Pain 7   In the last MONTH (on 0-10 scale) has pain interfered with the following?  1. General activity like being  able to carry out your everyday physical activities such as walking, climbing stairs, carrying groceries, or moving a chair?  Rating(8)   +Driver, -BT, -Dye Allergies.  

## 2019-09-14 NOTE — Progress Notes (Signed)
Erik Cook - 59 y.o. male MRN [id number]  Date of birth: 23-Aug-1960  Office Visit Note: Visit Date: 09/13/2019 PCP: Patient, No Pcp Per Referred by: No ref. provider found  Subjective: Chief Complaint  Patient presents with  . Neck - Pain  . Right Arm - Pain  . Left Arm - Pain   HPI:  Erik Cook is a 59 y.o. male who comes in today For planned left C7-T1 interlaminar dural steroid injection.  Prior injection was very beneficial for him for several months.  He continues to follow with Dr. [name].  He has had prior ACDF.  The patient has failed conservative care including home exercise, medications, time and activity modification.  This injection will be diagnostic and hopefully therapeutic.  Please see requesting physician notes for further details and justification.   ROS Otherwise per HPI.  Assessment & Plan: Visit Diagnoses:  1. Cervical disc disorder with radiculopathy     Plan: No additional findings.   Meds & Orders:  Meds ordered this encounter  Medications  . methylPREDNISolone acetate (DEPO-MEDROL) injection 40 mg    Orders Placed This Encounter  Procedures  . XR C-ARM NO REPORT  . Epidural Steroid injection    Follow-up: Return if symptoms worsen or fail to improve.   Procedures: No procedures performed  Cervical Epidural Steroid Injection - Interlaminar Approach with Fluoroscopic Guidance  Patient: Erik Cook      Date of Birth: Apr 09, 1961 MRN: [id number] PCP: Patient, No Pcp Per      Visit Date: 09/13/2019   Universal Protocol:    Date/Time: 02/11/215:01 AM  Consent Given By: the patient  Position: PRONE  Additional Comments: Vital signs were monitored before and after the procedure. Patient was prepped and draped in the usual sterile fashion. The correct patient, procedure, and site was verified.   Injection Procedure Details:  Procedure Site One Meds Administered:  Meds ordered this encounter  Medications  .  methylPREDNISolone acetate (DEPO-MEDROL) injection 40 mg     Laterality: Left  Location/Site: C7-T1  Needle size: 20 G  Needle type: Touhy  Needle Placement: Paramedian epidural space  Findings:  -Comments: Excellent flow of contrast along the nerve and into the epidural space.  Procedure Details: Using a paramedian approach from the side mentioned above, the region overlying the inferior lamina was localized under fluoroscopic visualization and the soft tissues overlying this structure were infiltrated with 4 ml. of 1% Lidocaine without Epinephrine. A # 20 gauge, Tuohy needle was inserted into the epidural space using a paramedian approach.  The epidural space was localized using loss of resistance along with lateral and contralateral oblique bi-planar fluoroscopic views.  After negative aspirate for air, blood, and CSF, a 2 ml. volume of Isovue-250 was injected into the epidural space and the flow of contrast was observed. Radiographs were obtained for documentation purposes.   The injectate was administered into the level noted above.  Additional Comments:  The patient tolerated the procedure well Dressing: 2 x 2 sterile gauze and Band-Aid    Post-procedure details: Patient was observed during the procedure. Post-procedure instructions were reviewed.  Patient left the clinic in stable condition.     Clinical History: MRI CERVICAL SPINE WITHOUT CONTRAST  TECHNIQUE: Multiplanar, multisequence MR imaging of the cervical spine was performed. No intravenous contrast was administered.  COMPARISON:  Radiographs dated 01/05/2019  FINDINGS: Alignment: 1.5 mm retrolisthesis of C6 on C7.  Otherwise normal.  Vertebrae: Solid anterior fusion at C4-5. No fracture or discitis  or significant bone lesion.  Cord: Normal signal and morphology.  Posterior Fossa, vertebral arteries, paraspinal tissues: Negative.  Disc levels:  C2-3: Normal disc. Minimal degenerative  changes of the facet joints. Widely patent neural foramina.  C3-4: Normal disc. Minimal degenerative changes of the facet joints. Widely patent neural foramina.  C4-5: Solid anterior fusion. Widely patent neural foramina. No neural impingement.  C5-6: Disc space narrowing. Small disc bulges to the right and left of midline more prominent to the right with slight right foraminal narrowing.  C6-7: Disc space narrowing. Osteophytes protrude into both neural foramina creating mild bilateral foraminal narrowing, left more than right.  C7-T1: Normal.  IMPRESSION: 1. Degenerative disc disease at C5-6 and C6-7 with slight bilateral foraminal narrowing at C6-7 and slight right foraminal narrowing at C5-6. 2. Solid anterior fusion at C4-5 with neural impingement.   Electronically Signed   By: Lorriane Shire M.D.   On: 01/25/2019 17:06  -------------------------- MRI LUMBAR SPINE WITHOUT CONTRAST  TECHNIQUE: Multiplanar, multisequence MR imaging of the lumbar spine was performed. No intravenous contrast was administered.  COMPARISON: Radiography 12/30/2017  FINDINGS: Segmentation: 5 lumbar type vertebral bodies  Alignment: Mild anterolisthesis at L5-S1  Vertebrae: No fracture, evidence of discitis, or aggressive bone lesion. L2 hemangioma  Conus medullaris and cauda equina: Conus extends to the L1 level. Conus and cauda equina appear normal.  Paraspinal and other soft tissues: Negative  Disc levels:  T12- L1: Unremarkable.  L1-L2: Mild facet hypertrophy. No impingement  L2-L3: Disc narrowing and bulging. Mild facet hypertrophy. Noncompressive spinal canal and foraminal narrowing.  L3-L4: Disc narrowing and bulging. Mild facet spurring. Right foraminal impingement. Mild spinal stenosis.  L4-L5: Disc narrowing and bulging with endplate spurring. Negative facets. Noncompressive bilateral foraminal narrowing. Mild spinal stenosis  L5-S1:Mild  degenerative facet spurring. Mild disc bulging. There is left subarticular recess narrowing without convincing S1 impingement.  IMPRESSION: 1. Disc narrowing and bulging at L2-3 to L4-5 with mild spinal stenosis. 2. L3-4 moderate to advanced right foraminal stenosis. There is noncompressive foraminal narrowing the other levels of disc degeneration.   Electronically Signed By: Monte Fantasia M.D. On: 04/17/2018 10:18     Objective:  VS:  HT:    WT:   BMI:     BP:126/71  HR:93bpm  TEMP: ( )  RESP:  Physical Exam  Ortho Exam Imaging: XR C-ARM NO REPORT  Result Date: 09/13/2019 Please see Notes tab for imaging impression.

## 2019-09-14 NOTE — Procedures (Signed)
S1 Lumbosacral Transforaminal Epidural Steroid Injection - Sub-Pedicular Approach with Fluoroscopic Guidance   Patient: Erik Cook      Date of Birth: 1961-06-10 MRN: [id number] PCP: Patient, No Pcp Per      Visit Date: 06/22/2019   Universal Protocol:    Date/Time: 02/11/215:04 AM  Consent Given By: the patient  Position:  PRONE  Additional Comments: Vital signs were monitored before and after the procedure. Patient was prepped and draped in the usual sterile fashion. The correct patient, procedure, and site was verified.   Injection Procedure Details:  Procedure Site One Meds Administered:  Meds ordered this encounter  Medications  . betamethasone acetate-betamethasone sodium phosphate (CELESTONE) injection 12 mg    Laterality: Left  Location/Site:  S1 Foramen   Needle size: 22 ga.  Needle type: Spinal  Needle Placement: Transforaminal  Findings:   -Comments: Excellent flow of contrast along the nerve and into the epidural space.  Epidurogram: Contrast epidurogram showed no nerve root cut off or restricted flow pattern.  Procedure Details: After squaring off the sacral end-plate to get a true AP view, the C-arm was positioned so that the best possible view of the S1 foramen was visualized. The soft tissues overlying this structure were infiltrated with 2-3 ml. of 1% Lidocaine without Epinephrine.    The spinal needle was inserted toward the target using a "trajectory" view along the fluoroscope beam.  Under AP and lateral visualization, the needle was advanced so it did not puncture dura. Biplanar projections were used to confirm position. Aspiration was confirmed to be negative for CSF and/or blood. A 1-2 ml. volume of Isovue-250 was injected and flow of contrast was noted at each level. Radiographs were obtained for documentation purposes.   After attaining the desired flow of contrast documented above, a 0.5 to 1.0 ml test dose of 0.25% Marcaine was injected  into each respective transforaminal space.  The patient was observed for 90 seconds post injection.  After no sensory deficits were reported, and normal lower extremity motor function was noted,   the above injectate was administered so that equal amounts of the injectate were placed at each foramen (level) into the transforaminal epidural space.   Additional Comments:  The patient tolerated the procedure well Dressing: Band-Aid with 2 x 2 sterile gauze    Post-procedure details: Patient was observed during the procedure. Post-procedure instructions were reviewed.  Patient left the clinic in stable condition.

## 2019-09-14 NOTE — Progress Notes (Signed)
Erik Cook - 58 y.o. male MRN [id number]  Date of birth: 14-Dec-1960  Office Visit Note: Visit Date: 06/22/2019 PCP: Patient, No Pcp Per Referred by: No ref. provider found  Subjective: Chief Complaint  Patient presents with  . Lower Back - Pain  . Left Leg - Pain  . Right Thigh - Pain   HPI:  Erik Cook is a 59 y.o. male who comes in today At the request of Dr. Basil Dess for left S1 transforaminal epidural steroid injection.  Patient is having left radicular leg pain down to the foot.  Prior MRI from 2019 really not specific other than some lateral recess narrowing at L5-S1 on the left.  No high-grade stenosis or nerve compression.  Prior injection lasted until the end of October.  The patient has failed conservative care including home exercise, medications, time and activity modification.  This injection will be diagnostic and hopefully therapeutic.  Please see requesting physician notes for further details and justification.   ROS Otherwise per HPI.  Assessment & Plan: Visit Diagnoses:  1. Lumbar radiculopathy     Plan: No additional findings.   Meds & Orders:  Meds ordered this encounter  Medications  . betamethasone acetate-betamethasone sodium phosphate (CELESTONE) injection 12 mg    Orders Placed This Encounter  Procedures  . XR C-ARM NO REPORT  . Epidural Steroid injection    Follow-up: No follow-ups on file.   Procedures: No procedures performed  S1 Lumbosacral Transforaminal Epidural Steroid Injection - Sub-Pedicular Approach with Fluoroscopic Guidance   Patient: Erik Cook      Date of Birth: May 23, 1961 MRN: [id number] PCP: Patient, No Pcp Per      Visit Date: 06/22/2019   Universal Protocol:    Date/Time: 02/11/215:04 AM  Consent Given By: the patient  Position:  PRONE  Additional Comments: Vital signs were monitored before and after the procedure. Patient was prepped and draped in the usual sterile fashion. The correct patient,  procedure, and site was verified.   Injection Procedure Details:  Procedure Site One Meds Administered:  Meds ordered this encounter  Medications  . betamethasone acetate-betamethasone sodium phosphate (CELESTONE) injection 12 mg    Laterality: Left  Location/Site:  S1 Foramen   Needle size: 22 ga.  Needle type: Spinal  Needle Placement: Transforaminal  Findings:   -Comments: Excellent flow of contrast along the nerve and into the epidural space.  Epidurogram: Contrast epidurogram showed no nerve root cut off or restricted flow pattern.  Procedure Details: After squaring off the sacral end-plate to get a true AP view, the C-arm was positioned so that the best possible view of the S1 foramen was visualized. The soft tissues overlying this structure were infiltrated with 2-3 ml. of 1% Lidocaine without Epinephrine.    The spinal needle was inserted toward the target using a "trajectory" view along the fluoroscope beam.  Under AP and lateral visualization, the needle was advanced so it did not puncture dura. Biplanar projections were used to confirm position. Aspiration was confirmed to be negative for CSF and/or blood. A 1-2 ml. volume of Isovue-250 was injected and flow of contrast was noted at each level. Radiographs were obtained for documentation purposes.   After attaining the desired flow of contrast documented above, a 0.5 to 1.0 ml test dose of 0.25% Marcaine was injected into each respective transforaminal space.  The patient was observed for 90 seconds post injection.  After no sensory deficits were reported, and normal lower extremity motor function was noted,  the above injectate was administered so that equal amounts of the injectate were placed at each foramen (level) into the transforaminal epidural space.   Additional Comments:  The patient tolerated the procedure well Dressing: Band-Aid with 2 x 2 sterile gauze    Post-procedure details: Patient was observed  during the procedure. Post-procedure instructions were reviewed.  Patient left the clinic in stable condition.     Clinical History: MRI CERVICAL SPINE WITHOUT CONTRAST  TECHNIQUE: Multiplanar, multisequence MR imaging of the cervical spine was performed. No intravenous contrast was administered.  COMPARISON:  Radiographs dated 01/05/2019  FINDINGS: Alignment: 1.5 mm retrolisthesis of C6 on C7.  Otherwise normal.  Vertebrae: Solid anterior fusion at C4-5. No fracture or discitis or significant bone lesion.  Cord: Normal signal and morphology.  Posterior Fossa, vertebral arteries, paraspinal tissues: Negative.  Disc levels:  C2-3: Normal disc. Minimal degenerative changes of the facet joints. Widely patent neural foramina.  C3-4: Normal disc. Minimal degenerative changes of the facet joints. Widely patent neural foramina.  C4-5: Solid anterior fusion. Widely patent neural foramina. No neural impingement.  C5-6: Disc space narrowing. Small disc bulges to the right and left of midline more prominent to the right with slight right foraminal narrowing.  C6-7: Disc space narrowing. Osteophytes protrude into both neural foramina creating mild bilateral foraminal narrowing, left more than right.  C7-T1: Normal.  IMPRESSION: 1. Degenerative disc disease at C5-6 and C6-7 with slight bilateral foraminal narrowing at C6-7 and slight right foraminal narrowing at C5-6. 2. Solid anterior fusion at C4-5 with neural impingement.   Electronically Signed   By: Francene Boyers M.D.   On: 01/25/2019 17:06  -------------------------- MRI LUMBAR SPINE WITHOUT CONTRAST  TECHNIQUE: Multiplanar, multisequence MR imaging of the lumbar spine was performed. No intravenous contrast was administered.  COMPARISON: Radiography 12/30/2017  FINDINGS: Segmentation: 5 lumbar type vertebral bodies  Alignment: Mild anterolisthesis at L5-S1  Vertebrae: No fracture,  evidence of discitis, or aggressive bone lesion. L2 hemangioma  Conus medullaris and cauda equina: Conus extends to the L1 level. Conus and cauda equina appear normal.  Paraspinal and other soft tissues: Negative  Disc levels:  T12- L1: Unremarkable.  L1-L2: Mild facet hypertrophy. No impingement  L2-L3: Disc narrowing and bulging. Mild facet hypertrophy. Noncompressive spinal canal and foraminal narrowing.  L3-L4: Disc narrowing and bulging. Mild facet spurring. Right foraminal impingement. Mild spinal stenosis.  L4-L5: Disc narrowing and bulging with endplate spurring. Negative facets. Noncompressive bilateral foraminal narrowing. Mild spinal stenosis  L5-S1:Mild degenerative facet spurring. Mild disc bulging. There is left subarticular recess narrowing without convincing S1 impingement.  IMPRESSION: 1. Disc narrowing and bulging at L2-3 to L4-5 with mild spinal stenosis. 2. L3-4 moderate to advanced right foraminal stenosis. There is noncompressive foraminal narrowing the other levels of disc degeneration.   Electronically Signed By: Marnee Spring M.D. On: 04/17/2018 10:18     Objective:  VS:  HT:    WT:   BMI:     BP:140/88  HR:72bpm  TEMP: ( )  RESP:  Physical Exam  Ortho Exam Imaging:

## 2019-09-14 NOTE — Procedures (Signed)
Cervical Epidural Steroid Injection - Interlaminar Approach with Fluoroscopic Guidance  Patient: Erik Cook      Date of Birth: 04-08-1961 MRN: [id number] PCP: Patient, No Pcp Per      Visit Date: 09/13/2019   Universal Protocol:    Date/Time: 02/11/215:01 AM  Consent Given By: the patient  Position: PRONE  Additional Comments: Vital signs were monitored before and after the procedure. Patient was prepped and draped in the usual sterile fashion. The correct patient, procedure, and site was verified.   Injection Procedure Details:  Procedure Site One Meds Administered:  Meds ordered this encounter  Medications  . methylPREDNISolone acetate (DEPO-MEDROL) injection 40 mg     Laterality: Left  Location/Site: C7-T1  Needle size: 20 G  Needle type: Touhy  Needle Placement: Paramedian epidural space  Findings:  -Comments: Excellent flow of contrast along the nerve and into the epidural space.  Procedure Details: Using a paramedian approach from the side mentioned above, the region overlying the inferior lamina was localized under fluoroscopic visualization and the soft tissues overlying this structure were infiltrated with 4 ml. of 1% Lidocaine without Epinephrine. A # 20 gauge, Tuohy needle was inserted into the epidural space using a paramedian approach.  The epidural space was localized using loss of resistance along with lateral and contralateral oblique bi-planar fluoroscopic views.  After negative aspirate for air, blood, and CSF, a 2 ml. volume of Isovue-250 was injected into the epidural space and the flow of contrast was observed. Radiographs were obtained for documentation purposes.   The injectate was administered into the level noted above.  Additional Comments:  The patient tolerated the procedure well Dressing: 2 x 2 sterile gauze and Band-Aid    Post-procedure details: Patient was observed during the procedure. Post-procedure instructions were  reviewed.  Patient left the clinic in stable condition.

## 2019-11-30 ENCOUNTER — Telehealth: Payer: Self-pay | Admitting: Physical Medicine and Rehabilitation

## 2019-11-30 NOTE — Telephone Encounter (Signed)
Patient is requesting an injection for his lower back. Left S1 TF 06/22/2019. Ok to repeat if helped, same problem/side, and no new injury?

## 2019-11-30 NOTE — Telephone Encounter (Signed)
ok 

## 2019-12-01 NOTE — Telephone Encounter (Signed)
Is auth needed? Scheduled 5/19 at 1430 with driver.

## 2019-12-01 NOTE — Telephone Encounter (Signed)
Left message #1

## 2019-12-05 NOTE — Telephone Encounter (Signed)
Service [id number]:[id number] Authorization IHDTPN:[id number] Auth Effective Date:05/04/2021Auth End Date:10/31/2021Initiated Date:05/04/2021Decision Date:05/04/2021Decision Type :InitialCase Status:Approved

## 2019-12-20 ENCOUNTER — Other Ambulatory Visit: Payer: Self-pay

## 2019-12-20 ENCOUNTER — Encounter: Payer: Self-pay | Admitting: Physical Medicine and Rehabilitation

## 2019-12-20 ENCOUNTER — Ambulatory Visit: Payer: Medicare HMO | Admitting: Physical Medicine and Rehabilitation

## 2019-12-20 ENCOUNTER — Ambulatory Visit: Payer: Self-pay

## 2019-12-20 VITALS — BP 127/88 | HR 90

## 2019-12-20 DIAGNOSIS — M5416 Radiculopathy, lumbar region: Secondary | ICD-10-CM | POA: Diagnosis not present

## 2019-12-20 MED ORDER — METHYLPREDNISOLONE ACETATE 80 MG/ML IJ SUSP
80.0000 mg | Freq: Once | INTRAMUSCULAR | Status: AC
  Administered 2019-12-20: 80 mg

## 2019-12-20 NOTE — Procedures (Signed)
S1 Lumbosacral Transforaminal Epidural Steroid Injection - Sub-Pedicular Approach with Fluoroscopic Guidance   Patient: Erik Cook      Date of Birth: [birth year] MRN: [id number] PCP: Patient, No Pcp Per      Visit Date: 12/20/2019   Universal Protocol:    Date/Time: 05/19/212:47 PM  Consent Given By: the patient  Position:  PRONE  Additional Comments: Vital signs were monitored before and after the procedure. Patient was prepped and draped in the usual sterile fashion. The correct patient, procedure, and site was verified.   Injection Procedure Details:  Procedure Site One Meds Administered:  Meds ordered this encounter  Medications  . methylPREDNISolone acetate (DEPO-MEDROL) injection 80 mg    Laterality: Left  Location/Site:  S1 Foramen   Needle size: 22 ga.  Needle type: Spinal  Needle Placement: Transforaminal  Findings:   -Comments: Excellent flow of contrast along the nerve and into the epidural space.  Epidurogram: Contrast epidurogram showed no nerve root cut off or restricted flow pattern.  Procedure Details: After squaring off the sacral end-plate to get a true AP view, the C-arm was positioned so that the best possible view of the S1 foramen was visualized. The soft tissues overlying this structure were infiltrated with 2-3 ml. of 1% Lidocaine without Epinephrine.    The spinal needle was inserted toward the target using a "trajectory" view along the fluoroscope beam.  Under AP and lateral visualization, the needle was advanced so it did not puncture dura. Biplanar projections were used to confirm position. Aspiration was confirmed to be negative for CSF and/or blood. A 1-2 ml. volume of Isovue-250 was injected and flow of contrast was noted at each level. Radiographs were obtained for documentation purposes.   After attaining the desired flow of contrast documented above, a 0.5 to 1.0 ml test dose of 0.25% Marcaine was injected into each respective  transforaminal space.  The patient was observed for 90 seconds post injection.  After no sensory deficits were reported, and normal lower extremity motor function was noted,   the above injectate was administered so that equal amounts of the injectate were placed at each foramen (level) into the transforaminal epidural space.   Additional Comments:  The patient tolerated the procedure well Dressing: Band-Aid with 2 x 2 sterile gauze    Post-procedure details: Patient was observed during the procedure. Post-procedure instructions were reviewed.  Patient left the clinic in stable condition.

## 2019-12-20 NOTE — Progress Notes (Signed)
Pt states pain in across the lower back that radiates into the left leg all the way down. Pt states pain started years ago. Pt states any movement makes pain worse. Injection helps with pain.   .Numeric Pain Rating Scale and Functional Assessment Average Pain 7   In the last MONTH (on 0-10 scale) has pain interfered with the following?  1. General activity like being  able to carry out your everyday physical activities such as walking, climbing stairs, carrying groceries, or moving a chair?  Rating(8)   +Driver, -BT, -Dye Allergies.

## 2019-12-25 NOTE — Progress Notes (Signed)
Erik Cook - 59 y.o. male MRN [id number]  Date of birth: [birth year]  Office Visit Note: Visit Date: 12/20/2019 PCP: Patient, No Pcp Per Referred by: No ref. provider found  Subjective: Chief Complaint  Patient presents with  . Lower Back - Pain  . Left Leg - Pain   HPI:  Erik Cook is a 59 y.o. male who comes in today for planned Left S1-2 lumbar transforaminal epidural steroid injection with fluoroscopic guidance.  The patient has failed conservative care including home exercise, medications, time and activity modification.  This injection will be diagnostic and hopefully therapeutic.  Please see requesting physician notes for further details and justification.  Patient followed by Dr. Basil Dess.  He has pretty clear left S1 radicular type symptoms.  Prior injection in November 2020 was very successful and beneficial up until just recently.  No new injuries or trauma.   ROS Otherwise per HPI.  Assessment & Plan: Visit Diagnoses:  1. Lumbar radiculopathy     Plan: No additional findings.   Meds & Orders:  Meds ordered this encounter  Medications  . methylPREDNISolone acetate (DEPO-MEDROL) injection 80 mg    Orders Placed This Encounter  Procedures  . XR C-ARM NO REPORT  . Epidural Steroid injection    Follow-up: Return for visit to requesting physician as needed.   Procedures: No procedures performed  S1 Lumbosacral Transforaminal Epidural Steroid Injection - Sub-Pedicular Approach with Fluoroscopic Guidance   Patient: Erik Cook      Date of Birth: [birth year] MRN: [id number] PCP: Patient, No Pcp Per      Visit Date: 12/20/2019   Universal Protocol:    Date/Time: 05/19/212:47 PM  Consent Given By: the patient  Position:  PRONE  Additional Comments: Vital signs were monitored before and after the procedure. Patient was prepped and draped in the usual sterile fashion. The correct patient, procedure, and site was verified.   Injection Procedure  Details:  Procedure Site One Meds Administered:  Meds ordered this encounter  Medications  . methylPREDNISolone acetate (DEPO-MEDROL) injection 80 mg    Laterality: Left  Location/Site:  S1 Foramen   Needle size: 22 ga.  Needle type: Spinal  Needle Placement: Transforaminal  Findings:   -Comments: Excellent flow of contrast along the nerve and into the epidural space.  Epidurogram: Contrast epidurogram showed no nerve root cut off or restricted flow pattern.  Procedure Details: After squaring off the sacral end-plate to get a true AP view, the C-arm was positioned so that the best possible view of the S1 foramen was visualized. The soft tissues overlying this structure were infiltrated with 2-3 ml. of 1% Lidocaine without Epinephrine.    The spinal needle was inserted toward the target using a "trajectory" view along the fluoroscope beam.  Under AP and lateral visualization, the needle was advanced so it did not puncture dura. Biplanar projections were used to confirm position. Aspiration was confirmed to be negative for CSF and/or blood. A 1-2 ml. volume of Isovue-250 was injected and flow of contrast was noted at each level. Radiographs were obtained for documentation purposes.   After attaining the desired flow of contrast documented above, a 0.5 to 1.0 ml test dose of 0.25% Marcaine was injected into each respective transforaminal space.  The patient was observed for 90 seconds post injection.  After no sensory deficits were reported, and normal lower extremity motor function was noted,   the above injectate was administered so that equal amounts of the injectate were placed at  each foramen (level) into the transforaminal epidural space.   Additional Comments:  The patient tolerated the procedure well Dressing: Band-Aid with 2 x 2 sterile gauze    Post-procedure details: Patient was observed during the procedure. Post-procedure instructions were reviewed.  Patient left  the clinic in stable condition.     Clinical History: MRI CERVICAL SPINE WITHOUT CONTRAST  TECHNIQUE: Multiplanar, multisequence MR imaging of the cervical spine was performed. No intravenous contrast was administered.  COMPARISON:  Radiographs dated 01/05/2019  FINDINGS: Alignment: 1.5 mm retrolisthesis of C6 on C7.  Otherwise normal.  Vertebrae: Solid anterior fusion at C4-5. No fracture or discitis or significant bone lesion.  Cord: Normal signal and morphology.  Posterior Fossa, vertebral arteries, paraspinal tissues: Negative.  Disc levels:  C2-3: Normal disc. Minimal degenerative changes of the facet joints. Widely patent neural foramina.  C3-4: Normal disc. Minimal degenerative changes of the facet joints. Widely patent neural foramina.  C4-5: Solid anterior fusion. Widely patent neural foramina. No neural impingement.  C5-6: Disc space narrowing. Small disc bulges to the right and left of midline more prominent to the right with slight right foraminal narrowing.  C6-7: Disc space narrowing. Osteophytes protrude into both neural foramina creating mild bilateral foraminal narrowing, left more than right.  C7-T1: Normal.  IMPRESSION: 1. Degenerative disc disease at C5-6 and C6-7 with slight bilateral foraminal narrowing at C6-7 and slight right foraminal narrowing at C5-6. 2. Solid anterior fusion at C4-5 with neural impingement.   Electronically Signed   By: Francene Boyers M.D.   On: 01/25/2019 17:06  -------------------------- MRI LUMBAR SPINE WITHOUT CONTRAST  TECHNIQUE: Multiplanar, multisequence MR imaging of the lumbar spine was performed. No intravenous contrast was administered.  COMPARISON: Radiography 12/30/2017  FINDINGS: Segmentation: 5 lumbar type vertebral bodies  Alignment: Mild anterolisthesis at L5-S1  Vertebrae: No fracture, evidence of discitis, or aggressive bone lesion. L2 hemangioma  Conus  medullaris and cauda equina: Conus extends to the L1 level. Conus and cauda equina appear normal.  Paraspinal and other soft tissues: Negative  Disc levels:  T12- L1: Unremarkable.  L1-L2: Mild facet hypertrophy. No impingement  L2-L3: Disc narrowing and bulging. Mild facet hypertrophy. Noncompressive spinal canal and foraminal narrowing.  L3-L4: Disc narrowing and bulging. Mild facet spurring. Right foraminal impingement. Mild spinal stenosis.  L4-L5: Disc narrowing and bulging with endplate spurring. Negative facets. Noncompressive bilateral foraminal narrowing. Mild spinal stenosis  L5-S1:Mild degenerative facet spurring. Mild disc bulging. There is left subarticular recess narrowing without convincing S1 impingement.  IMPRESSION: 1. Disc narrowing and bulging at L2-3 to L4-5 with mild spinal stenosis. 2. L3-4 moderate to advanced right foraminal stenosis. There is noncompressive foraminal narrowing the other levels of disc degeneration.   Electronically Signed By: Marnee Spring M.D. On: 04/17/2018 10:18     Objective:  VS:  HT:    WT:   BMI:     BP:127/88  HR:90bpm  TEMP: ( )  RESP:  Physical Exam Constitutional:      General: He is not in acute distress.    Appearance: Normal appearance. He is not ill-appearing.  HENT:     Head: Normocephalic and atraumatic.     Right Ear: External ear normal.     Left Ear: External ear normal.  Eyes:     Extraocular Movements: Extraocular movements intact.  Cardiovascular:     Rate and Rhythm: Normal rate.     Pulses: Normal pulses.  Abdominal:     General: There is no distension.  Palpations: Abdomen is soft.  Musculoskeletal:        General: No tenderness or signs of injury.     Right lower leg: No edema.     Left lower leg: No edema.     Comments: Patient has good distal strength without clonus.  Skin:    Findings: No erythema or rash.  Neurological:     General: No focal deficit present.      Mental Status: He is alert and oriented to person, place, and time.     Sensory: No sensory deficit.     Motor: No weakness or abnormal muscle tone.     Coordination: Coordination normal.  Psychiatric:        Mood and Affect: Mood normal.        Behavior: Behavior normal.      Imaging: No results found.

## 2019-12-28 ENCOUNTER — Other Ambulatory Visit: Payer: Self-pay | Admitting: Specialist

## 2020-01-26 ENCOUNTER — Telehealth: Payer: Self-pay | Admitting: Physical Medicine and Rehabilitation

## 2020-01-26 NOTE — Telephone Encounter (Signed)
Wants cervical ESI. Last was 2/10.21. Ok to repeat?

## 2020-01-26 NOTE — Telephone Encounter (Signed)
Is auth needed for [id number]? Scheduled for 7/21.

## 2020-01-26 NOTE — Telephone Encounter (Signed)
ok 

## 2020-01-26 NOTE — Telephone Encounter (Signed)
Patient called. He would like an appointment with Dr. Alvester Morin. His call back number is [phone]

## 2020-01-29 NOTE — Telephone Encounter (Signed)
Your Clinical documentation has been sent to [employer] for further review Thank you for submitting your preauthorization request. The case has been sent to [employer] for further review.  If you have any questions please contact [employer] at [phone]. Case/AuthorizationService ENMMH:[id number] Initiated Date:06/28/2021Case Activity:RN Review ProcessCase Status:Pending

## 2020-01-31 ENCOUNTER — Telehealth: Payer: Self-pay | Admitting: *Deleted

## 2020-01-31 NOTE — Telephone Encounter (Signed)
Please see notes below.

## 2020-01-31 NOTE — Telephone Encounter (Signed)
Received denial via insurance per Dr. Alvester Morin for Dr. Otelia Sergeant to do peer to peer? Please Advise.

## 2020-01-31 NOTE — Telephone Encounter (Signed)
Called pt and advised pt that insurance has denied repeat injection and per Dr. Alvester Morin to f/u with Dr. Otelia Sergeant for an ov. Also advised pt that appt has been cancelled.

## 2020-02-06 ENCOUNTER — Telehealth: Payer: Self-pay

## 2020-02-06 NOTE — Telephone Encounter (Signed)
Patient called in returning call regarding  His appt . Says if not able to reach him call again or leave VM

## 2020-02-08 ENCOUNTER — Telehealth: Payer: Self-pay | Admitting: Specialist

## 2020-02-08 NOTE — Telephone Encounter (Signed)
I called and lmom that his insurance denied the request for repeat injection.  So per Dr. Alvester Morin he needs to follow up with Dr. Otelia Sergeant, and be re-evaluated.  At that time, if Dr. Otelia Sergeant feels that another injection is needed, we can request approval with the insurance using Dr. Barbaraann Faster OV note. And hopefully we can get it approved for him.  ---Patient needs to make an appointment with Dr. Otelia Sergeant

## 2020-02-08 NOTE — Telephone Encounter (Signed)
I called and lmom that I did not have anything before early August for an appt with Dr, Otelia Sergeant, however he can see Erik Cook and he can evaluate him.

## 2020-02-08 NOTE — Telephone Encounter (Signed)
Patient called. He does not want to wait until August to see Dr. Otelia Sergeant. Would like a call back. His number is [phone]. He is scheduled with Barry Dienes 7/15

## 2020-02-15 ENCOUNTER — Other Ambulatory Visit: Payer: Self-pay

## 2020-02-15 ENCOUNTER — Ambulatory Visit: Payer: Self-pay

## 2020-02-15 ENCOUNTER — Ambulatory Visit: Payer: Medicare HMO | Admitting: Surgery

## 2020-02-15 ENCOUNTER — Encounter: Payer: Self-pay | Admitting: Surgery

## 2020-02-15 VITALS — BP 122/86 | HR 88 | Ht 69.0 in | Wt 200.0 lb

## 2020-02-15 DIAGNOSIS — M48062 Spinal stenosis, lumbar region with neurogenic claudication: Secondary | ICD-10-CM

## 2020-02-15 DIAGNOSIS — M542 Cervicalgia: Secondary | ICD-10-CM

## 2020-02-15 DIAGNOSIS — M5416 Radiculopathy, lumbar region: Secondary | ICD-10-CM | POA: Diagnosis not present

## 2020-02-15 DIAGNOSIS — M4722 Other spondylosis with radiculopathy, cervical region: Secondary | ICD-10-CM | POA: Diagnosis not present

## 2020-02-15 NOTE — Progress Notes (Signed)
Office Visit Note   Patient: Erik Cook           Date of Birth: Dec 30, 1960           MRN: [id number] Visit Date: 02/15/2020              Requested by: No referring provider defined for this encounter. PCP: Patient, No Pcp Per   Assessment & Plan: Visit Diagnoses:  1. Spinal stenosis of lumbar region with neurogenic claudication   2. Cervicalgia   3. Other spondylosis with radiculopathy, cervical region   4. Radiculopathy, lumbar region     Plan: With patient's progressively worsening symptoms in the cervical and lumbar spine that have failed conservative treatment with multiple ESI's and chiropractic treatments I recommend repeating MRI scans of both areas.  Patient will follow up with Dr. Otelia Sergeant after completion to discuss results and further treatment options.  Follow-Up Instructions: Return in about 3 weeks (around 03/07/2020) for With Dr. Otelia Sergeant to review cervical and lumbar MRI scan.   Orders:  Orders Placed This Encounter  Procedures  . XR Cervical Spine 2 or 3 views  . XR Lumbar Spine 2-3 Views   No orders of the defined types were placed in this encounter.     Procedures: No procedures performed   Clinical Data: No additional findings.   Subjective: Chief Complaint  Patient presents with  . Lower Back - Follow-up    HPI 59 year old white male comes in today with complaints of neck pain and low back pain.  Patient has a known history of cervical and lumbar spondylosis.  Last seen by Dr. Otelia Sergeant May 12, 2019.  Patient is status post C4-5 fusion by Dr. Shon Baton March 2014. Most recent cervical MRI January 25, 2019 and this showed:  EXAM: MRI CERVICAL SPINE WITHOUT CONTRAST  TECHNIQUE: Multiplanar, multisequence MR imaging of the cervical spine was performed. No intravenous contrast was administered.  COMPARISON:  Radiographs dated 01/05/2019  FINDINGS: Alignment: 1.5 mm retrolisthesis of C6 on C7.  Otherwise normal.  Vertebrae: Solid anterior  fusion at C4-5. No fracture or discitis or significant bone lesion.  Cord: Normal signal and morphology.  Posterior Fossa, vertebral arteries, paraspinal tissues: Negative.  Disc levels:  C2-3: Normal disc. Minimal degenerative changes of the facet joints. Widely patent neural foramina.  C3-4: Normal disc. Minimal degenerative changes of the facet joints. Widely patent neural foramina.  C4-5: Solid anterior fusion. Widely patent neural foramina. No neural impingement.  C5-6: Disc space narrowing. Small disc bulges to the right and left of midline more prominent to the right with slight right foraminal narrowing.  C6-7: Disc space narrowing. Osteophytes protrude into both neural foramina creating mild bilateral foraminal narrowing, left more than right.  C7-T1: Normal.  IMPRESSION: 1. Degenerative disc disease at C5-6 and C6-7 with slight bilateral foraminal narrowing at C6-7 and slight right foraminal narrowing at C5-6. 2. Solid anterior fusion at C4-5 with neural impingement.   Most recent lumbar MRI April 17, 2018 and that showed:  EXAM: MRI LUMBAR SPINE WITHOUT CONTRAST  TECHNIQUE: Multiplanar, multisequence MR imaging of the lumbar spine was performed. No intravenous contrast was administered.  COMPARISON:  Radiography 12/30/2017  FINDINGS: Segmentation:  5 lumbar type vertebral bodies  Alignment:  Mild anterolisthesis at L5-S1  Vertebrae: No fracture, evidence of discitis, or aggressive bone lesion. L2 hemangioma  Conus medullaris and cauda equina: Conus extends to the L1 level. Conus and cauda equina appear normal.  Paraspinal and other soft tissues: Negative  Disc  levels:  T12- L1: Unremarkable.  L1-L2: Mild facet hypertrophy.  No impingement  L2-L3: Disc narrowing and bulging. Mild facet hypertrophy. Noncompressive spinal canal and foraminal narrowing.  L3-L4: Disc narrowing and bulging. Mild facet spurring.  Right foraminal impingement. Mild spinal stenosis.  L4-L5: Disc narrowing and bulging with endplate spurring. Negative facets. Noncompressive bilateral foraminal narrowing. Mild spinal stenosis  L5-S1:Mild degenerative facet spurring. Mild disc bulging. There is left subarticular recess narrowing without convincing S1 impingement.  IMPRESSION: 1. Disc narrowing and bulging at L2-3 to L4-5 with mild spinal stenosis. 2. L3-4 moderate to advanced right foraminal stenosis. There is noncompressive foraminal narrowing the other levels of disc Degeneration.  Patient has had cervical ESI's September 13, 2019 and August 2020.  Lumbar ESI's Dec 20, 2019 November 2020 and August 2020.  Most recent injections both areas gave some improvement for about a month.  He is also failed conservative management with chiropractic treatments that he stopped last year.  Continues have ongoing neck pain with left greater than right upper extremity radiculopathy with pain and numbness going down both arms.  Regards to his low back he has pain radiating down the right greater than left lower extremity with some feeling of right leg weakness.    Review of Systems No current cardiac pulmonary GI GU issues  Objective: Vital Signs: BP 122/86   Pulse 88   Ht 5\' 9"  (1.753 m)   Wt 200 lb (90.7 kg)   BMI 29.53 kg/m   Physical Exam Constitutional:      Appearance: Normal appearance.  HENT:     Head: Normocephalic and atraumatic.  Eyes:     Extraocular Movements: Extraocular movements intact.     Pupils: Pupils are equal, round, and reactive to light.  Pulmonary:     Effort: No respiratory distress.  Musculoskeletal:     Comments: On exam patient has right greater than left brachial plexus and trapezius tenderness.  Lumbar paraspinal tenderness.  Positive right straight leg raise.  Right sciatic notch tenderness.  No focal motor deficits upper or lower extremities.  Bilateral shoulder exam unremarkable.   Negative logroll bilateral hips.  Neurological:     Mental Status: He is alert and oriented to person, place, and time.  Psychiatric:        Mood and Affect: Mood normal.     Ortho Exam  Specialty Comments:  No specialty comments available.  Imaging: No results found.   PMFS History: There are no problems to display for this patient.  Past Medical History:  Diagnosis Date  . Hypertension     History reviewed. No pertinent family history.  Past Surgical History:  Procedure Laterality Date  . ANTERIOR CERVICAL DECOMP/DISCECTOMY FUSION N/A 10/05/2012   Procedure: ANTERIOR CERVICAL DECOMPRESSION/DISCECTOMY FUSION 1 LEVEL;  Surgeon: 12/05/2012, MD;  Location: [facility] OR;  Service: Orthopedics;  Laterality: N/A;  TOTAL DISC REPLACEMENT VERSES ACDF C4-5  . KNEE ARTHROSCOPY     left  . KNEE ARTHROSCOPY     on left side again  . ROTATOR CUFF REPAIR     left  . ROTATOR CUFF REPAIR     2nd time around (after fall)   Social History   Occupational History  . Not on file  Tobacco Use  . Smoking status: Never Smoker  . Smokeless tobacco: Never Used  Substance and Sexual Activity  . Alcohol use: No  . Drug use: No  . Sexual activity: Not on file

## 2020-02-21 ENCOUNTER — Encounter: Payer: Medicare HMO | Admitting: Physical Medicine and Rehabilitation

## 2020-03-06 ENCOUNTER — Telehealth: Payer: Self-pay | Admitting: Physical Medicine and Rehabilitation

## 2020-03-06 NOTE — Telephone Encounter (Signed)
Please see note below, your note from the office visit on 02/15/20 is not complete. Can you please advise?

## 2020-03-06 NOTE — Telephone Encounter (Signed)
Patient was scheduled for a repeat C7-T1 IL on 7/21, but this was cancelled. Patient saw Zonia Kief on 7/15 and was told to wait until after MRI's (cervical and lumbar mentioned in note). Patient states that he has not heard from anyone about scheduling the MRI. I do not see recent orders in epic for these. Please advise.

## 2020-03-08 NOTE — Telephone Encounter (Signed)
Both ordered.  I thought I had put them in.

## 2020-03-18 ENCOUNTER — Ambulatory Visit (INDEPENDENT_AMBULATORY_CARE_PROVIDER_SITE_OTHER): Payer: Medicare HMO

## 2020-03-18 ENCOUNTER — Other Ambulatory Visit: Payer: Self-pay

## 2020-03-18 DIAGNOSIS — M542 Cervicalgia: Secondary | ICD-10-CM

## 2020-03-18 DIAGNOSIS — M5416 Radiculopathy, lumbar region: Secondary | ICD-10-CM

## 2020-03-18 DIAGNOSIS — M4722 Other spondylosis with radiculopathy, cervical region: Secondary | ICD-10-CM

## 2020-03-18 DIAGNOSIS — M48062 Spinal stenosis, lumbar region with neurogenic claudication: Secondary | ICD-10-CM | POA: Diagnosis not present

## 2020-03-20 ENCOUNTER — Telehealth: Payer: Self-pay | Admitting: Specialist

## 2020-03-20 NOTE — Telephone Encounter (Signed)
Patient called asking for Dr. Otelia Sergeant, PA Barry Dienes, or nurse. Patient is unaware of what his next steps are. Please call patient at [phone].

## 2020-03-20 NOTE — Telephone Encounter (Signed)
I called and advsied that per Michiel Cowboy note he was to have the MRI's done and then follow up with Dr. Otelia Sergeant to review them, I made him an appt for 04/26/20 @ 845am, and I put him on the cancellation list to get him in sooner.

## 2020-03-26 ENCOUNTER — Ambulatory Visit: Payer: Self-pay

## 2020-03-26 ENCOUNTER — Other Ambulatory Visit: Payer: Self-pay

## 2020-03-26 ENCOUNTER — Ambulatory Visit: Payer: Medicare HMO | Admitting: Specialist

## 2020-03-26 ENCOUNTER — Encounter: Payer: Self-pay | Admitting: Specialist

## 2020-03-26 VITALS — BP 148/101 | HR 82 | Ht 69.0 in | Wt 200.0 lb

## 2020-03-26 DIAGNOSIS — M5416 Radiculopathy, lumbar region: Secondary | ICD-10-CM

## 2020-03-26 DIAGNOSIS — M2391 Unspecified internal derangement of right knee: Secondary | ICD-10-CM

## 2020-03-26 DIAGNOSIS — M25461 Effusion, right knee: Secondary | ICD-10-CM

## 2020-03-26 DIAGNOSIS — R200 Anesthesia of skin: Secondary | ICD-10-CM

## 2020-03-26 DIAGNOSIS — M4322 Fusion of spine, cervical region: Secondary | ICD-10-CM

## 2020-03-26 DIAGNOSIS — M4722 Other spondylosis with radiculopathy, cervical region: Secondary | ICD-10-CM | POA: Diagnosis not present

## 2020-03-26 DIAGNOSIS — M48062 Spinal stenosis, lumbar region with neurogenic claudication: Secondary | ICD-10-CM

## 2020-03-26 NOTE — Patient Instructions (Addendum)
Avoid overhead lifting and overhead use of the arms. Do not lift greater than 5 lbs. Adjust head rest in vehicle to prevent hyperextension if rear ended. Take extra precautions to avoid falling. Avoid bending, stooping and avoid lifting weights greater than 10 lbs. Avoid prolong standing and walking. Avoid frequent bending and stooping  No lifting greater than 10 lbs. May use ice or moist heat for pain. Weight loss is of benefit. Handicap license is approved. Dr. Prescott Blas secretary/Assistant will call to arrange for epidural steroid injection for neck and lumbar. Also facet block left C3-4 and bilateral L5-S1. May need to consider lumbar decompressive laminectomies L2-3, L3-4 and L4-5 and L1-2.  May need to consider left C6-7 foramenotomy if a block of the left C7 nerve root relieves left arm pain.  MRi right knee due to internal derrangement and exam showing ACL laxity and medial joint pain consistent with mensical injury

## 2020-03-26 NOTE — Progress Notes (Signed)
Office Visit Note   Patient: Erik LimboJohn Cook           Date of Birth: 03/02/1961           MRN: [id number] Visit Date: 03/26/2020              Requested by: No referring provider defined for this encounter. PCP: Patient, No Pcp Per   Assessment & Plan: Visit Diagnoses:  1. Acute internal derangement of right knee   2. Spinal stenosis of lumbar region with neurogenic claudication   3. Other spondylosis with radiculopathy, cervical region   4. Lt arm numbness   5. Fusion of spine, cervical region   6. Radiculopathy, lumbar region     Plan: Avoid overhead lifting and overhead use of the arms. Do not lift greater than 5 lbs. Adjust head rest in vehicle to prevent hyperextension if rear ended. Take extra precautions to avoid falling. Avoid bending, stooping and avoid lifting weights greater than 10 lbs. Avoid prolong standing and walking. Avoid frequent bending and stooping  No lifting greater than 10 lbs. May use ice or moist heat for pain. Weight loss is of benefit. Handicap license is approved. Dr.  BlasNewton's secretary/Assistant will call to arrange for epidural steroid injection for neck and lumbar. Also facet block left C3-4 and bilateral L5-S1. May need to consider lumbar decompressive laminectomies L2-3, L3-4 and L4-5 and L1-2.  May need to consider left C6-7 foramenotomy if a block of the left C7 nerve root relieves left arm pain.   Follow-Up Instructions: No follow-ups on file.   Orders:  No orders of the defined types were placed in this encounter.  No orders of the defined types were placed in this encounter.     Procedures: No procedures performed   Clinical Data: Findings:  Narrative & Impression CLINICAL DATA:  Neck pain radiating to left arm  EXAM: MRI CERVICAL SPINE WITHOUT CONTRAST  TECHNIQUE: Multiplanar, multisequence MR imaging of the cervical spine was performed. No intravenous contrast was administered.  COMPARISON:   01/25/2019  FINDINGS: Alignment: Normal  Vertebrae: C4-5 ACDF. No acute abnormality. Mild facet edema on the left at C3-4.  Cord: Normal  Posterior Fossa, vertebral arteries, paraspinal tissues: Normal  Disc levels:  C1-2: Unremarkable.  C2-3: Normal disc space and facet joints. There is no spinal canal stenosis. No neural foraminal stenosis.  C3-4: Mild left facet edema is new since the prior study. There is mild bilateral uncovertebral hypertrophy. There is no spinal canal stenosis. Mild bilateral neural foraminal stenosis.  C4-5: ACDF. There is no spinal canal stenosis. No neural foraminal stenosis.  C5-6: Small disc bulge. Unchanged right uncovertebral osteophyte. There is no spinal canal stenosis. Moderate right neural foraminal stenosis.  C6-7: Left asymmetric disc bulge with left-greater-than-right uncovertebral hypertrophy. There is no spinal canal stenosis. Mild right and moderate left, unchanged neural foraminal stenosis.  C7-T1: Normal disc space and facet joints. There is no spinal canal stenosis. No neural foraminal stenosis.  IMPRESSION: 1. Mild left C3-4 facet edema is new since the prior study and could be a source of local neck pain. 2. Moderate right C5-6 neural foraminal stenosis, unchanged. 3. Mild right and moderate left C6-7 neural foraminal stenosis, unchanged.   Electronically Signed   By: Deatra RobinsonKevin  Herman M.D.   On: 03/18/2020 19:31   Narrative & Impression CLINICAL DATA:  Low back pain for greater than 6 weeks. Pain radiates to both legs. Fall years ago.  EXAM: MRI LUMBAR SPINE WITHOUT CONTRAST  TECHNIQUE:  Multiplanar, multisequence MR imaging of the lumbar spine was performed. No intravenous contrast was administered.  COMPARISON:  Lumbar spine MRI 04/17/2018  FINDINGS: Segmentation:  Standard.  Alignment:  Grade 1 anterolisthesis at L5-S1 is unchanged.  Vertebrae: No fracture, evidence of discitis, or  bone lesion. L2 hemangioma.  Conus medullaris and cauda equina: Conus extends to the L1 level. Conus and cauda equina appear normal.  Paraspinal and other soft tissues: Negative.  Disc levels:  L1-L2: Unchanged small left subarticular disc protrusion. Left lateral recess narrowing without central spinal canal stenosis. No neural foraminal stenosis.  L2-L3: Intermediate disc bulge. Slight progression of mild spinal canal stenosis. Unchanged mild left neural foraminal stenosis.  L3-L4: Progression of moderate disc bulge. Progression of mild spinal canal stenosis. Unchanged mild left and moderate right neural foraminal stenosis.  L4-L5: Left asymmetric disc bulge, unchanged. Narrowing of both lateral recesses without central spinal canal stenosis. Moderate bilateral neural foraminal stenosis.  L5-S1: Mild facet hypertrophy. Grade 1 anterolisthesis. There is no spinal canal stenosis. No neural foraminal stenosis.  Visualized sacrum: Normal.  IMPRESSION: 1. Progression of mild spinal canal stenosis at L2-L3 and L3-L4. 2. Moderate bilateral L4 and right L3 neural foraminal stenosis.   Electronically Signed   By: Deatra Robinson M.D.   On: 03/18/2020 19:41       Subjective: Chief Complaint  Patient presents with  . Neck - Follow-up    MRI review   . Lower Back - Follow-up    MRI Review    Erik Cook seen today 59 year old male with history of fall in an elevator shaft with both cervical and lumbar pathology. He is painful today complaints of right knee pain with it giving away and he has had some popping and catching since an ESI. The knee gave away a couple of days post ESI. He notes problems with burning sensation in the legs and feet and he feels like he is walking on marbles. The right foot was itching real bad. No injury, sees a podiatrist. Last injection with Dr. Alvester Morin done about 3 months ago and he is also having cervicalgia. The right knee is torn  up. He feels like the right knee is related to ESI, he stepped over something and his right knee popped inward. The right knee is getting progressively worse, using an ice pack on the right knee. Saw primary care MD and had a steroid injection into the right knee. He was offered an xray, told to wear a brace. Last ESI was left S1 12/20/2019 and he relates the right knee gave out 2 days ago. He was seen by Zonia Kief and had evaluation. Last ESI prior to 5/19 were lumbar 06/2019 and 03/2019.  Reportedly his insurance company denied further ESIs.  He has pain in the neck and shoulders with pain down the left arm. A ESI of the cervical spine was denied, the last being about a year ago. He is having neck pain he describes as the worse that it has been previous ACDF by Dr. Shon Baton in 2014 and has since been treated conservatively. The left arm I have been having a lot of problems, the pain and numbness left arm never went away. The  Numbness radiates into the left ulnar 2 digits both numbness and pain. Uses to have elbow pain. The pain goes all the way down the left arm into the left forearm and hand. Rubbing the left shoulder Helps some. There is pain with left neck rotation with stiffness  greater than on the right side. No bowel or bladder difficulties. He leans on the cart with grocery shopping and nearly had to go to using  An electric cart with last grocery visit. It has been nearly a year since he was able to walk in the neighborhood. With standing and walking he is feeling it in the legs, legs feel tired and the knees want to give away. That discomfort improves with sitting. Knees hurt with sitting down. With injections his knee feels better.  There is pain with extension of the neck with pain into the center of his upper back.   Review of Systems  Constitutional: Positive for activity change and unexpected weight change (Little weight gain, not able to go to gym due to right knee ). Negative for  appetite change, chills, diaphoresis, fatigue and fever.  HENT: Positive for mouth sores (autoimmune response triggered with refined , and ? testosterone replacement.). Negative for congestion, dental problem, drooling, ear discharge, ear pain, nosebleeds, postnasal drip, rhinorrhea, sinus pressure, sinus pain, sneezing, sore throat, tinnitus, trouble swallowing and voice change.   Eyes: Negative.  Negative for photophobia, pain, discharge, redness, itching and visual disturbance.  Respiratory: Negative.  Negative for apnea, cough, choking, chest tightness, shortness of breath, wheezing and stridor.   Cardiovascular: Negative.  Negative for chest pain, palpitations and leg swelling.  Gastrointestinal: Negative.  Negative for abdominal distention, abdominal pain, anal bleeding, blood in stool, constipation, diarrhea, nausea, rectal pain and vomiting.  Endocrine: Negative.  Negative for cold intolerance, heat intolerance, polydipsia, polyphagia and polyuria.  Genitourinary: Negative.  Negative for difficulty urinating, discharge, dysuria, enuresis, flank pain, frequency, genital sores, hematuria and urgency.  Musculoskeletal: Positive for arthralgias, back pain, gait problem, joint swelling, neck pain and neck stiffness.  Skin: Negative.  Negative for color change, pallor, rash and wound.  Allergic/Immunologic: Negative.  Negative for environmental allergies, food allergies and immunocompromised state.  Neurological: Positive for weakness and numbness. Negative for dizziness, tremors, seizures, syncope, facial asymmetry, speech difficulty, light-headedness and headaches.  Hematological: Negative for adenopathy. Does not bruise/bleed easily.  Psychiatric/Behavioral: Positive for decreased concentration (since last anesthesia his short term memory is poor.) and sleep disturbance. Negative for agitation, behavioral problems, confusion, dysphoric mood, hallucinations, self-injury and suicidal ideas. The  patient is not nervous/anxious and is not hyperactive.      Objective: Vital Signs: BP (!) 148/101 (BP Location: Left Arm, Patient Position: Sitting)   Pulse 82   Ht 5\' 9"  (1.753 m)   Wt 200 lb (90.7 kg)   BMI 29.53 kg/m   Physical Exam Constitutional:      Appearance: He is well-developed.  HENT:     Head: Normocephalic and atraumatic.  Eyes:     Pupils: Pupils are equal, round, and reactive to light.  Pulmonary:     Effort: Pulmonary effort is normal.     Breath sounds: Normal breath sounds.  Abdominal:     General: Bowel sounds are normal.     Palpations: Abdomen is soft.  Musculoskeletal:     Cervical back: Normal range of motion and neck supple.     Lumbar back: Positive right straight leg raise test and positive left straight leg raise test.     Right knee: Effusion present.     Instability Tests: Medial McMurray test positive. Lateral McMurray test negative.  Skin:    General: Skin is warm and dry.  Neurological:     Mental Status: He is alert and oriented to person, place, and  time.  Psychiatric:        Behavior: Behavior normal.        Thought Content: Thought content normal.        Judgment: Judgment normal.     Right Knee Exam   Tenderness  The patient is experiencing tenderness in the medial retinaculum, pes anserinus and medial joint line.  Range of Motion  Extension:  -5 abnormal  Flexion: 130   Tests  McMurray:  Medial - positive Lateral - negative Valgus: positive Lachman:  Anterior - 2+ (positive right anterior drawer)     Drawer:  Anterior - 2+     Pivot shift: 1+ Patellar apprehension: negative  Other  Erythema: absent Sensation: normal Pulse: present Swelling: mild Effusion: effusion present  Comments:  Ballotment sign is positive suprapatella pouch fluid wave.   Back Exam   Tenderness  The patient is experiencing tenderness in the cervical and lumbar.  Range of Motion  Extension:  50 abnormal  Flexion:  50 abnormal   Lateral bend right: abnormal  Lateral bend left: abnormal  Rotation right: abnormal  Rotation left: abnormal   Muscle Strength  Right Quadriceps:  5/5  Left Quadriceps:  5/5  Right Hamstrings:  5/5   Tests  Straight leg raise right: positive Straight leg raise left: positive  Reflexes  Patellar: 1/4 Achilles: 1/4 Babinski's sign: normal   Other  Toe walk: abnormal Heel walk: normal Sensation: decreased Gait: antalgic  Erythema: no back redness Scars: absent  Comments:  Pain with testing toes with pressure on the great toe.      Specialty Comments:  No specialty comments available.  Imaging: No results found.   PMFS History: There are no problems to display for this patient.  Past Medical History:  Diagnosis Date  . Hypertension     History reviewed. No pertinent family history.  Past Surgical History:  Procedure Laterality Date  . 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 KNEE ARTHROSCOPY     left  . KNEE ARTHROSCOPY     on left side again  . ROTATOR CUFF REPAIR     left  . ROTATOR CUFF REPAIR     2nd time around (after fall)   Social History   Occupational History  . Not on file  Tobacco Use  . Smoking status: Never Smoker  . Smokeless tobacco: Never Used  Substance and Sexual Activity  . Alcohol use: No  . Drug use: No  . Sexual activity: Not on file

## 2020-04-03 ENCOUNTER — Encounter: Payer: Self-pay | Admitting: Specialist

## 2020-04-05 ENCOUNTER — Telehealth: Payer: Self-pay

## 2020-04-05 NOTE — Telephone Encounter (Signed)
Patient called he wants to schedule a appointment with Dr.Newton referred by Dr.Nitka. Call back:[phone]

## 2020-04-09 ENCOUNTER — Encounter: Payer: Self-pay | Admitting: Physical Medicine and Rehabilitation

## 2020-04-09 ENCOUNTER — Other Ambulatory Visit: Payer: Self-pay | Admitting: Radiology

## 2020-04-09 DIAGNOSIS — M2391 Unspecified internal derangement of right knee: Secondary | ICD-10-CM

## 2020-04-16 ENCOUNTER — Ambulatory Visit
Admission: RE | Admit: 2020-04-16 | Discharge: 2020-04-16 | Disposition: A | Discharge status: Home / Self Care | Payer: Medicare HMO | Source: Ambulatory Visit | Attending: Specialist | Admitting: Specialist

## 2020-04-16 DIAGNOSIS — M2391 Unspecified internal derangement of right knee: Secondary | ICD-10-CM

## 2020-04-23 NOTE — Telephone Encounter (Signed)
Peer to peer has been requested. 

## 2020-04-26 ENCOUNTER — Ambulatory Visit: Payer: Medicare HMO | Admitting: Specialist

## 2020-04-30 ENCOUNTER — Telehealth: Payer: Self-pay | Admitting: Physical Medicine and Rehabilitation

## 2020-04-30 NOTE — Telephone Encounter (Signed)
Scheduled

## 2020-04-30 NOTE — Telephone Encounter (Signed)
Pt was approve for [id number] Auth# [id number].

## 2020-04-30 NOTE — Telephone Encounter (Signed)
Patient called returning from [place]. Patient states he will be able to answer in the next 30 mins. Please call patient at [phone].

## 2020-05-01 ENCOUNTER — Ambulatory Visit: Payer: Medicare HMO | Admitting: Specialist

## 2020-05-02 ENCOUNTER — Ambulatory Visit (INDEPENDENT_AMBULATORY_CARE_PROVIDER_SITE_OTHER): Payer: Medicare HMO | Admitting: Physical Medicine and Rehabilitation

## 2020-05-02 ENCOUNTER — Ambulatory Visit: Payer: Self-pay

## 2020-05-02 ENCOUNTER — Encounter: Payer: Self-pay | Admitting: Physical Medicine and Rehabilitation

## 2020-05-02 ENCOUNTER — Other Ambulatory Visit: Payer: Self-pay

## 2020-05-02 VITALS — BP 143/98 | HR 73

## 2020-05-02 DIAGNOSIS — Z981 Arthrodesis status: Secondary | ICD-10-CM

## 2020-05-02 DIAGNOSIS — M5412 Radiculopathy, cervical region: Secondary | ICD-10-CM

## 2020-05-02 DIAGNOSIS — M5116 Intervertebral disc disorders with radiculopathy, lumbar region: Secondary | ICD-10-CM

## 2020-05-02 DIAGNOSIS — M47812 Spondylosis without myelopathy or radiculopathy, cervical region: Secondary | ICD-10-CM | POA: Diagnosis not present

## 2020-05-02 DIAGNOSIS — G894 Chronic pain syndrome: Secondary | ICD-10-CM

## 2020-05-02 DIAGNOSIS — M542 Cervicalgia: Secondary | ICD-10-CM | POA: Diagnosis not present

## 2020-05-02 MED ORDER — METHYLPREDNISOLONE ACETATE 80 MG/ML IJ SUSP: 40.0000 mg | Freq: Once | INTRAMUSCULAR | Status: DC

## 2020-05-02 NOTE — Progress Notes (Signed)
Erik Cook - 59 y.o. male MRN [id number]  Date of birth: 1960/12/26  Office Visit Note: Visit Date: 05/02/2020 PCP: Patient, No Pcp Per Referred by: Kerrin Champagne, MD  Subjective: Chief Complaint  Patient presents with  . Neck - Pain   HPI: Erik Cook is a 59 y.o. male who comes in today For evaluation and management at the request of Dr. [name] for several spine issues.  He is having chronic worsening severe left neck pain worse with rotation.  He has been evaluated by Dr. [name] and comes in today for planned diagnostic left facet joint block or medial branch block at C3-4.  Patient has imaging showing more arthritis at this level with some facet edema.  He does have pain with rotation.  He has had physical therapy and medication management without much relief.  Again this is a diagnostic block and we would look at double block paradigm and goal towards potential for ablation of this joint.  This will be done with fluoroscopic guidance.  Pain diary to be utilized to see if he has decrease of medication and increase functional level.  Secondary issue today is a separate issue which is more intermittent radicular pain into the left arm.  This is a different pain than the pain worse with rotation and extension.  This has been documented by his surgeon Dr. Otelia Sergeant.  Patient has prior ACDF.  He has had imaging showing adjacent level disease.  He has had epidural injections in the past which can be reviewed in the notes.  That relief of the epidural injection gave him more than 60% relief for about 4 months.  The relief he obtained did decrease his medication usage and gave him more functional ability and those were noted in the prior times that we saw him.  Again he has had physical therapy as well as medication management.  The injections I performed with him and been part of a comprehensive pain management approach with spine surgeon in-house as well as in-house physical therapy and  access to biopsychosocial pain psychology if needed.  Patient is having some stress in his life now with financial concerns.  He denies any right-sided arm complaints at all left-sided.  No focal weakness no red flag complaints.  Patient also has lumbar spine issues with mainly radicular type pain which has responded in the past epidural injection.  Last injection performed was in May and the one prior to that was in December or November.  He has had about 5 months of relief with each injection.  This was an S1 transforaminal epidural steroid injection.  Again as noted above failure of conservative care with no plans at the moment for surgical approach to the lumbar spine although it is being considered.  Again has had physical therapy and medication management.  Review of Systems  Musculoskeletal: Positive for back pain, joint pain and neck pain.  Neurological: Positive for tingling.  All other systems reviewed and are negative.  Otherwise per HPI.  Assessment & Plan: Visit Diagnoses:  1. Cervicalgia   2. Cervical spondylosis without myelopathy   3. Cervical radiculopathy   4. S/P cervical spinal fusion   5. Radiculopathy due to lumbar intervertebral disc disorder   6. Chronic pain syndrome     Plan: Findings:  1.  Left-sided cervicalgia worse with left-sided rotation no pain with right rotation.  Some pain with extension with a negative Spurling's.  We will complete diagnostic left C3-4 facet joint  block.  Again this will be part of a comprehensive pain management approach but would look at double block paradigm and possible ablation if successful.  This was planned for today.  2.  Left-sided shoulder and arm pain consistent with a radicular pain with some impingement signs of the left shoulder.  He does get referral into the hand with paresthesia at times.  He has had prior ACDF.  Please note HPI to show relief with prior epidural injection.  Would schedule this for a few weeks out depending  on preauthorization.  3.  Back and left hip and leg pain consistent with findings on MRI and he has had prior epidural injections on a couple of occasions with really good relief.  He is not hurting as much in these areas at this moment but it is bothering him quite a bit.  This is all despite conservative care with medication management and physical therapy.  He does have some stressors going on with financial situation.  Patient does not endorse the need for counseling at this point.  He will continue with current medications through Dr. Otelia Sergeant with home exercises.  Consider epidural injection repeat.    Meds & Orders:  Meds ordered this encounter  Medications  . methylPREDNISolone acetate (DEPO-MEDROL) injection 40 mg    Orders Placed This Encounter  Procedures  . Facet Injection  . XR C-ARM NO REPORT    Follow-up: Return if symptoms worsen or fail to improve.   Procedures: No procedures performed  Diagnostic Cervical Facet Joint Nerve Block with Fluoroscopic Guidance  Patient: Erik Cook      Date of Birth: 21-Dec-1960 MRN: [id number] PCP: Patient, No Pcp Per      Visit Date: 05/02/2020   Universal Protocol:    Date/Time: 10/04/215:26 AM  Consent Given By: the patient  Position: LATERAL  Additional Comments: Vital signs were monitored before and after the procedure. Patient was prepped and draped in the usual sterile fashion. The correct patient, procedure, and site was verified.   Injection Procedure Details:  Procedure Site One Meds Administered:  Meds ordered this encounter  Medications  . methylPREDNISolone acetate (DEPO-MEDROL) injection 40 mg     Laterality: Left  Location/Site:  C3-4  Needle size: 25 G  Needle type: Standard  Needle Placement: Articular Pillar  Findings:  -Contrast Used: 0.5 mL iohexol 180 mg iodine/mL   -Comments: Excellent flow of contrast across the articular pillars without intravascular flow  Procedure Details: The  fluoroscope beam was manipulated to achieve the best "true" lateral view possible by squaring off the endplates with cranial and caudal tilt and using varying obliquity to achieve the a view with the longest length of spinous process.  The region overlying the facet joints mentioned above were then localized under fluoroscopic visualization.  The needle was inserted down to the center of the "trapezoid" outline of the facet joint lateral mass. Bi-planar images were used for confirming placement and spot radiographs were documented.  A 0.25 ml volume of Omnipaque-240 was injected to look for vascular uptake. A 0.5 ml. volume of the anesthetic solution was injected onto the target. This procedure was repeated for each medial branch nerve injected.  Prior to the procedure, the patient was given a Pain Diary which was completed for baseline measurements.  After the procedure, the patient rated their pain every 30 minutes and will continue rating at this frequency for a total of 5 hours.  The patient has been asked to complete the Diary and  return to us by mail, fax or hand delivered as soon as possible.   Additional Comments:  The patient tolerated the procedure well Dressing: Band-Aid    Post-procedure details: Patient was observed during the procedure. Post-procedure instructions were reviewed.  Patient left the clinic in stable condition.      Clinical History: MRI CERVICAL SPINE WITHOUT CONTRAST  TECHNIQUE: Multiplanar, multisequence MR imaging of the cervical spine was performed. No intravenous contrast was administered.  COMPARISON:  01/25/2019  FINDINGS: Alignment: Normal  Vertebrae: C4-5 ACDF. No acute abnormality. Mild facet edema on the left at C3-4.  Cord: Normal  Posterior Fossa, vertebral arteries, paraspinal tissues: Normal  Disc levels:  C1-2: Unremarkable.  C2-3: Normal disc space and facet joints. There is no spinal canal stenosis. No neural  foraminal stenosis.  C3-4: Mild left facet edema is new since the prior study. There is mild bilateral uncovertebral hypertrophy. There is no spinal canal stenosis. Mild bilateral neural foraminal stenosis.  C4-5: ACDF. There is no spinal canal stenosis. No neural foraminal stenosis.  C5-6: Small disc bulge. Unchanged right uncovertebral osteophyte. There is no spinal canal stenosis. Moderate right neural foraminal stenosis.  C6-7: Left asymmetric disc bulge with left-greater-than-right uncovertebral hypertrophy. There is no spinal canal stenosis. Mild right and moderate left, unchanged neural foraminal stenosis.  C7-T1: Normal disc space and facet joints. There is no spinal canal stenosis. No neural foraminal stenosis.  IMPRESSION: 1. Mild left C3-4 facet edema is new since the prior study and could be a source of local neck pain. 2. Moderate right C5-6 neural foraminal stenosis, unchanged. 3. Mild right and moderate left C6-7 neural foraminal stenosis, unchanged.   Electronically Signed   By: Deatra RobinsonKevin  Herman M.D.   On: 03/18/2020 19:31 --------------------------  MRI LUMBAR SPINE WITHOUT CONTRAST    TECHNIQUE:  Multiplanar, multisequence MR imaging of the lumbar spine was  performed. No intravenous contrast was administered.    COMPARISON: Radiography 12/30/2017    FINDINGS:  Segmentation: 5 lumbar type vertebral bodies    Alignment: Mild anterolisthesis at L5-S1    Vertebrae: No fracture, evidence of discitis, or aggressive bone  lesion. L2 hemangioma    Conus medullaris and cauda equina: Conus extends to the L1 level.  Conus and cauda equina appear normal.    Paraspinal and other soft tissues: Negative    Disc levels:    T12- L1: Unremarkable.    L1-L2: Mild facet hypertrophy. No impingement    L2-L3: Disc narrowing and bulging. Mild facet hypertrophy.  Noncompressive spinal canal and foraminal narrowing.    L3-L4: Disc narrowing and  bulging. Mild facet spurring. Right  foraminal impingement. Mild spinal stenosis.    L4-L5: Disc narrowing and bulging with endplate spurring. Negative  facets. Noncompressive bilateral foraminal narrowing. Mild spinal  stenosis    L5-S1:Mild degenerative facet spurring. Mild disc bulging. There is  left subarticular recess narrowing without convincing S1  impingement.    IMPRESSION:  1. Disc narrowing and bulging at L2-3 to L4-5 with mild spinal  stenosis.  2. L3-4 moderate to advanced right foraminal stenosis. There is  noncompressive foraminal narrowing the other levels of disc  degeneration.      Electronically Signed  By: Marnee SpringJonathon Watts M.D.  On: 04/17/2018 10:18   He reports that he has never smoked. He has never used smokeless tobacco. No results for input(s): HGBA1C, LABURIC in the last 8760 hours.  Objective:  VS:  HT:    WT:   BMI:  BP:(!) 143/98  HR:73bpm  TEMP: ( )  RESP:  Physical Exam Vitals and nursing note reviewed.  Constitutional:      General: He is not in acute distress.    Appearance: Normal appearance. He is well-developed.  HENT:     Head: Normocephalic and atraumatic.  Eyes:     Conjunctiva/sclera: Conjunctivae normal.     Pupils: Pupils are equal, round, and reactive to light.  Cardiovascular:     Rate and Rhythm: Normal rate.     Pulses: Normal pulses.     Heart sounds: Normal heart sounds.  Pulmonary:     Effort: Pulmonary effort is normal. No respiratory distress.  Musculoskeletal:     Cervical back: Neck supple. Tenderness present. No rigidity.     Right lower leg: No edema.     Left lower leg: No edema.     Comments: Patient sits with forward flexed cervical spine he has pain at end ranges of left rotation and extension and negative Spurling's test bilaterally.  He has some shoulder impingement more on the right than left.  He has good strength in the upper extremities without any deficits.  He has intact sensation.  He has  paresthesia in the left arm and more of a C6 distribution possibly C7.  Lumbar spine exam shows that he has concordant low back pain with extension and facet loading of the low back.  No pain over the PSIS or greater trochanters.  He has equivocally positive slump test on the left.  Good distal strength.  Lymphadenopathy:     Cervical: No cervical adenopathy.  Skin:    General: Skin is warm and dry.     Findings: No erythema or rash.  Neurological:     General: No focal deficit present.     Mental Status: He is alert and oriented to person, place, and time.     Sensory: No sensory deficit.     Coordination: Coordination normal.     Gait: Gait normal.  Psychiatric:        Mood and Affect: Mood normal.        Behavior: Behavior normal.     Ortho Exam  Imaging: No results found.  Past Medical/Family/Surgical/Social History: Medications & Allergies reviewed per EMR, new medications updated. There are no problems to display for this patient.  Past Medical History:  Diagnosis Date  . Hypertension    History reviewed. No pertinent family history. Past Surgical History:  Procedure Laterality Date  . 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 KNEE ARTHROSCOPY     left  . KNEE ARTHROSCOPY     on left side again  . ROTATOR CUFF REPAIR     left  . ROTATOR CUFF REPAIR     2nd time around (after fall)   Social History   Occupational History  . Not on file  Tobacco Use  . Smoking status: Never Smoker  . Smokeless tobacco: Never Used  Substance and Sexual Activity  . Alcohol use: No  . Drug use: No  . Sexual activity: Not on file

## 2020-05-02 NOTE — Progress Notes (Signed)
Left C8 distribution pain, some right with HA. F/up Nitka,  Pt state neck pain. Pt state moving and driving when twist to see a car makes the pain worse. Pt state excise and pain meds helps ease the pain. Pt has hx of inj on 09/13/19 pt state it was great and it lasted three months.   Numeric Pain Rating Scale and Functional Assessment Average Pain 8   In the last MONTH (on 0-10 scale) has pain interfered with the following?  1. General activity like being  able to carry out your everyday physical activities such as walking, climbing stairs, carrying groceries, or moving a chair?  Rating(10)   +Driver, -BT, -Dye Allergies.

## 2020-05-06 ENCOUNTER — Encounter: Payer: Self-pay | Admitting: Physical Medicine and Rehabilitation

## 2020-05-06 NOTE — Procedures (Signed)
Diagnostic Cervical Facet Joint Nerve Block with Fluoroscopic Guidance  Patient: Erik Cook      Date of Birth: May 21, 1961 MRN: [id number] PCP: Patient, No Pcp Per      Visit Date: 05/02/2020   Universal Protocol:    Date/Time: 10/04/215:26 AM  Consent Given By: the patient  Position: LATERAL  Additional Comments: Vital signs were monitored before and after the procedure. Patient was prepped and draped in the usual sterile fashion. The correct patient, procedure, and site was verified.   Injection Procedure Details:  Procedure Site One Meds Administered:  Meds ordered this encounter  Medications  . methylPREDNISolone acetate (DEPO-MEDROL) injection 40 mg     Laterality: Left  Location/Site:  C3-4  Needle size: 25 G  Needle type: Standard  Needle Placement: Articular Pillar  Findings:  -Contrast Used: 0.5 mL iohexol 180 mg iodine/mL   -Comments: Excellent flow of contrast across the articular pillars without intravascular flow  Procedure Details: The fluoroscope beam was manipulated to achieve the best "true" lateral view possible by squaring off the endplates with cranial and caudal tilt and using varying obliquity to achieve the a view with the longest length of spinous process.  The region overlying the facet joints mentioned above were then localized under fluoroscopic visualization.  The needle was inserted down to the center of the "trapezoid" outline of the facet joint lateral mass. Bi-planar images were used for confirming placement and spot radiographs were documented.  A 0.25 ml volume of Omnipaque-240 was injected to look for vascular uptake. A 0.5 ml. volume of the anesthetic solution was injected onto the target. This procedure was repeated for each medial branch nerve injected.  Prior to the procedure, the patient was given a Pain Diary which was completed for baseline measurements.  After the procedure, the patient rated their pain every 30 minutes and  will continue rating at this frequency for a total of 5 hours.  The patient has been asked to complete the Diary and return to [place] by mail, fax or hand delivered as soon as possible.   Additional Comments:  The patient tolerated the procedure well Dressing: Band-Aid    Post-procedure details: Patient was observed during the procedure. Post-procedure instructions were reviewed.  Patient left the clinic in stable condition.

## 2020-05-07 ENCOUNTER — Telehealth: Payer: Self-pay | Admitting: Physical Medicine and Rehabilitation

## 2020-05-07 NOTE — Telephone Encounter (Signed)
New note has been dictated (9/30). Needs authorization and scheduling for right L3 and L4 TF and left C7-T1 IL. (patient wants L3 and L4 TF first; C7-T1 IL can be scheduled 2-3 weeks after).

## 2020-05-09 NOTE — Telephone Encounter (Signed)
As per Otelia Sergeant and my last note we need pre-auth for both - 2 different visits

## 2020-05-09 NOTE — Telephone Encounter (Signed)
Auth PENDING

## 2020-05-09 NOTE — Telephone Encounter (Signed)
Please advise, Lumbar or Cervical

## 2020-05-13 NOTE — Telephone Encounter (Signed)
Send over Extra Notes, still PENDING

## 2020-05-14 NOTE — Telephone Encounter (Signed)
Pt has been approve Auth# [id number], pt can be sch.

## 2020-05-14 NOTE — Telephone Encounter (Signed)
Pt was approve for a Cervical TF, till waiting for Lumbar IL.

## 2020-05-20 NOTE — Telephone Encounter (Signed)
Denial insurance.

## 2020-05-24 ENCOUNTER — Encounter: Payer: Self-pay | Admitting: Specialist

## 2020-05-24 ENCOUNTER — Ambulatory Visit (INDEPENDENT_AMBULATORY_CARE_PROVIDER_SITE_OTHER): Payer: Medicare HMO | Admitting: Specialist

## 2020-05-24 ENCOUNTER — Other Ambulatory Visit: Payer: Self-pay

## 2020-05-24 VITALS — BP 146/93 | HR 84 | Ht 69.0 in | Wt 200.0 lb

## 2020-05-24 DIAGNOSIS — M4722 Other spondylosis with radiculopathy, cervical region: Secondary | ICD-10-CM

## 2020-05-24 DIAGNOSIS — M48062 Spinal stenosis, lumbar region with neurogenic claudication: Secondary | ICD-10-CM

## 2020-05-24 DIAGNOSIS — M2391 Unspecified internal derangement of right knee: Secondary | ICD-10-CM

## 2020-05-24 NOTE — Progress Notes (Signed)
Office Visit Note   Patient: Erik Cook           Date of Birth: 07-Aug-1960           MRN: [id number] Visit Date: 05/24/2020              Requested by: No referring provider defined for this encounter. PCP: Patient, No Pcp Per   Assessment & Plan: Visit Diagnoses:  1. Spinal stenosis of lumbar region with neurogenic claudication   2. Spondylosis of cervical spine with radiculopathy   3. Acute internal derangement of right knee     Plan: Avoid bending, stooping and avoid lifting weights greater than 10 lbs. Avoid prolong standing and walking. Avoid frequent bending and stooping  No lifting greater than 10 lbs. May use ice or moist heat for pain. Weight loss is of benefit. Handicap license is approved. Dr. Groton Blas secretary/Assistant will call to arrange for bilateral SI TF epidural steroid injections.   Follow-Up Instructions: Return Schedule an appt to return with Dr. 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   Orders:  No orders of the defined types were placed in this encounter.  No orders of the defined types were placed in this encounter.     Procedures: No procedures performed   Clinical Data: No additional findings.   Subjective: Chief Complaint  Patient presents with  . Neck - Follow-up    Had Lt C3-4 Facet injections on 05/02/20, says that they did not do much for him. States that he is in a lot of pain today that it is off the charts    59 year old male with history of lumbar spondylosis and foramenal stenosis affecting the legs, right and left side. He has a painful right  Knee with swelling and effusion. I recommended he consider a right knee arthroscopy for meniscal tear. Apparently no appointment For evaluation for meniscectomy has been given. He had cervical facet injection for edema and arthrosis changes left C3-4 with neck stiffness and pain with ROM cervical spine. His insurer was reluctant to approve cervical injection  and also has denied request for lumbar transforamenal ESI for his spondylosis condition of the lumbar spine. 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 Pain into the lumbar spine and LS junction with radiation into the buttocks and thighs.    Review of Systems  Constitutional: Negative.   HENT: Negative.   Eyes: Negative.   Respiratory: Negative.   Cardiovascular: Negative.   Gastrointestinal: Negative.   Endocrine: Negative.   Genitourinary: Negative.   Musculoskeletal: Negative.   Skin: Negative.   Allergic/Immunologic: Negative.   Neurological: Negative.   Hematological: Negative.   Psychiatric/Behavioral: Negative.      Objective: Vital Signs: BP (!) 146/93 (BP Location: Left Arm, Patient Position: Sitting)   Pulse 84   Ht 5\' 9"  (1.753 m)   Wt 200 lb (90.7 kg)   BMI 29.53 kg/m   Physical Exam Constitutional:      Appearance: He is well-developed.  HENT:     Head: Normocephalic and atraumatic.  Eyes:     Pupils: Pupils are equal, round, and reactive to light.  Pulmonary:     Effort: Pulmonary effort is normal.     Breath sounds: Normal breath sounds.  Abdominal:  General: Bowel sounds are normal.     Palpations: Abdomen is soft.  Musculoskeletal:        General: Normal range of motion.     Cervical back: Normal range of motion and neck supple.     Lumbar back: Negative right straight leg raise test and negative left straight leg raise test.  Skin:    General: Skin is warm and dry.  Neurological:     Mental Status: He is alert and oriented to person, place, and time.  Psychiatric:        Behavior: Behavior normal.        Thought Content: Thought content normal.        Judgment: Judgment normal.     Back Exam   Tenderness  The patient is experiencing  tenderness in the lumbar and cervical.  Range of Motion  Lateral bend right: normal  Lateral bend left: normal  Rotation right: normal  Rotation left: normal   Muscle Strength  Right Quadriceps:  5/5  Left Quadriceps:  5/5  Right Hamstrings:  5/5  Left Hamstrings:  5/5   Tests  Straight leg raise right: negative Straight leg raise left: negative  Reflexes  Patellar: 2/4 Achilles: 2/4  Other  Toe walk: normal Heel walk: normal      Specialty Comments:  No specialty comments available.  Imaging: No results found.   PMFS History: There are no problems to display for this patient.  Past Medical History:  Diagnosis Date  . Hypertension     History reviewed. No pertinent family history.  Past Surgical History:  Procedure Laterality Date  . 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 KNEE ARTHROSCOPY     left  . KNEE ARTHROSCOPY     on left side again  . ROTATOR CUFF REPAIR     left  . ROTATOR CUFF REPAIR     2nd time around (after fall)   Social History   Occupational History  . Not on file  Tobacco Use  . Smoking status: Never Smoker  . Smokeless tobacco: Never Used  Substance and Sexual Activity  . Alcohol use: No  . Drug use: No  . Sexual activity: Not on file

## 2020-06-03 ENCOUNTER — Encounter: Payer: Self-pay | Admitting: Orthopedic Surgery

## 2020-06-03 ENCOUNTER — Telehealth: Payer: Self-pay

## 2020-06-03 ENCOUNTER — Ambulatory Visit: Payer: Medicare HMO | Admitting: Orthopedic Surgery

## 2020-06-03 DIAGNOSIS — M25461 Effusion, right knee: Secondary | ICD-10-CM

## 2020-06-03 NOTE — Telephone Encounter (Signed)
Please submit for gel injection right knee -Dr Dean.  

## 2020-06-05 ENCOUNTER — Encounter: Payer: Self-pay | Admitting: Orthopedic Surgery

## 2020-06-05 ENCOUNTER — Ambulatory Visit: Payer: Medicare HMO | Admitting: Specialist

## 2020-06-05 DIAGNOSIS — M25461 Effusion, right knee: Secondary | ICD-10-CM

## 2020-06-05 MED ORDER — BUPIVACAINE HCL 0.25 % IJ SOLN
4.0000 mL | INTRAMUSCULAR | Status: AC | PRN
  Administered 2020-06-05: 4 mL via INTRA_ARTICULAR

## 2020-06-05 MED ORDER — METHYLPREDNISOLONE ACETATE 40 MG/ML IJ SUSP
40.0000 mg | INTRAMUSCULAR | Status: AC | PRN
  Administered 2020-06-05: 40 mg via INTRA_ARTICULAR

## 2020-06-05 MED ORDER — LIDOCAINE HCL 1 % IJ SOLN
5.0000 mL | INTRAMUSCULAR | Status: AC | PRN
  Administered 2020-06-05: 5 mL

## 2020-06-05 NOTE — Progress Notes (Signed)
Office Visit Note   Patient: Erik Cook           Date of Birth: 07-13-1961           MRN: [id number] Visit Date: 06/03/2020 Requested by: No referring provider defined for this encounter. PCP: Patient, No Pcp Per  Subjective: Chief Complaint  Patient presents with  . Right Knee - Pain    HPI: Erik Cook is a 59 year old patient with right knee pain.  He has had MRI and plain radiographs done.  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  Pain started in May.  He describes a valgus type collapse of the right knee.  His pain symptoms are getting worse.  Not having too much in the way of instability.  He states he cannot trust the right knee.  Hard for him to press the brake very hard.  He has had a cortisone shot earlier in the year which was not particularly helpful.  He wants to get his house on the market so he has a lot of physical activity things to do until the end of the year.  He is on disability.  Hard for him to sleep at night.  MRI scan is reviewed and he does have severe medial compartment arthritis along with ACL deficiency.              ROS: All systems reviewed are negative as they relate to the chief complaint within the history of present illness.  Patient denies  fevers or chills.   Assessment & Plan: Visit Diagnoses:  1. Effusion, right knee     Plan: Impression is right knee pain with ACL deficiency and medial compartment arthritis.  Not a candidate for partial knee replacement.  Arthroscopy is not can help the situation either.  Aspirated and injected the knee today just to give him some pain relief.  We will preapproved him for gel injection as well which could also marginally diminish his pain.  At some point he will need knee replacement.  I will see arthroscopy giving him any benefit at this time for his predominantly pain mediated symptoms.  Follow-Up Instructions: Return in about 3 weeks (around 06/24/2020).   Orders:  No orders of the defined  types were placed in this encounter.  No orders of the defined types were placed in this encounter.     Procedures: Large Joint Inj: R knee on 06/05/2020 7:39 AM Indications: diagnostic evaluation, joint swelling and pain Details: 18 G 1.5 in needle, superolateral approach  Arthrogram: No  Medications: 5 mL lidocaine 1 %; 40 mg methylPREDNISolone acetate 40 MG/ML; 4 mL bupivacaine 0.25 % Outcome: tolerated well, no immediate complications Procedure, treatment alternatives, risks and benefits explained, specific risks discussed. Consent was given by the patient. Immediately prior to procedure a time out was called to verify the correct patient, procedure, equipment, support staff and site/side marked as required. Patient was prepped and draped in the usual sterile fashion.    This patient is diagnosed with osteoarthritis of the knee(s).    Radiographs show evidence of joint space narrowing, osteophytes, subchondral sclerosis and/or subchondral cysts.  This patient has knee pain which interferes with functional and activities of daily living.    This patient has experienced inadequate response, adverse effects and/or intolerance with conservative treatments such as acetaminophen, NSAIDS, topical creams, physical therapy or regular exercise, knee bracing and/or weight loss.   This patient has experienced inadequate response or has a contraindication to intra articular steroid  injections for at least 3 months.   This patient is not scheduled to have a total knee replacement within 6 months of starting treatment with viscosupplementation. .  Clinical Data: No additional findings.  Objective: Vital Signs: There were no vitals taken for this visit.  Physical Exam:   Constitutional: Patient appears well-developed HEENT:  Head: Normocephalic Eyes:EOM are normal Neck: Normal range of motion Cardiovascular: Normal rate Pulmonary/chest: Effort normal Neurologic: Patient is alert Skin:  Skin is warm Psychiatric: Patient has normal mood and affect    Ortho Exam: Ortho exam demonstrates good quad tone bilaterally.  Mild effusion right knee versus left.  Collaterals are stable on the right knee.  Does have a little ACL laxity but not severe.  No posterior lateral rotatory instability is noted.  Pedal pulse palpable.  Ankle dorsiflexion intact.  No groin pain with internal extra rotation of the leg.  Specialty Comments:  No specialty comments available.  Imaging: No results found.   PMFS History: There are no problems to display for this patient.  Past Medical History:  Diagnosis Date  . Hypertension     History reviewed. No pertinent family history.  Past Surgical History:  Procedure Laterality Date  . 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 KNEE ARTHROSCOPY     left  . KNEE ARTHROSCOPY     on left side again  . ROTATOR CUFF REPAIR     left  . ROTATOR CUFF REPAIR     2nd time around (after fall)   Social History   Occupational History  . Not on file  Tobacco Use  . Smoking status: Never Smoker  . Smokeless tobacco: Never Used  Substance and Sexual Activity  . Alcohol use: No  . Drug use: No  . Sexual activity: Not on file

## 2020-06-06 ENCOUNTER — Telehealth: Payer: Self-pay

## 2020-06-06 ENCOUNTER — Ambulatory Visit: Payer: Self-pay

## 2020-06-06 ENCOUNTER — Encounter: Payer: Self-pay | Admitting: Physical Medicine and Rehabilitation

## 2020-06-06 ENCOUNTER — Other Ambulatory Visit: Payer: Self-pay

## 2020-06-06 ENCOUNTER — Ambulatory Visit (INDEPENDENT_AMBULATORY_CARE_PROVIDER_SITE_OTHER): Payer: Medicare HMO | Admitting: Physical Medicine and Rehabilitation

## 2020-06-06 VITALS — BP 137/96 | HR 78

## 2020-06-06 DIAGNOSIS — M5116 Intervertebral disc disorders with radiculopathy, lumbar region: Secondary | ICD-10-CM | POA: Diagnosis not present

## 2020-06-06 MED ORDER — METHYLPREDNISOLONE ACETATE 80 MG/ML IJ SUSP
80.0000 mg | Freq: Once | INTRAMUSCULAR | Status: AC
  Administered 2020-06-06: 80 mg

## 2020-06-06 NOTE — Telephone Encounter (Signed)
Submitted VOB, Gel-one right knee.

## 2020-06-06 NOTE — Telephone Encounter (Signed)
Noted  

## 2020-06-06 NOTE — Progress Notes (Signed)
Pt state Lower back pain that travels down bot legs. Pt state sitting down driving makes the pain worse. Pt state he takes pain meds and use ice packs to ease his pain.   Numeric Pain Rating Scale and Functional Assessment Average Pain 7   In the last MONTH (on 0-10 scale) has pain interfered with the following?  1. General activity like being  able to carry out your everyday physical activities such as walking, climbing stairs, carrying groceries, or moving a chair?  Rating(10)   +Driver, -BT, -Dye Allergies.

## 2020-06-20 ENCOUNTER — Encounter: Payer: Self-pay | Admitting: Physical Medicine and Rehabilitation

## 2020-06-20 ENCOUNTER — Ambulatory Visit: Payer: Self-pay

## 2020-06-20 ENCOUNTER — Other Ambulatory Visit: Payer: Self-pay

## 2020-06-20 ENCOUNTER — Ambulatory Visit: Payer: Medicare HMO | Admitting: Physical Medicine and Rehabilitation

## 2020-06-20 VITALS — BP 134/96 | HR 85

## 2020-06-20 DIAGNOSIS — M5412 Radiculopathy, cervical region: Secondary | ICD-10-CM

## 2020-06-20 MED ORDER — BETAMETHASONE SOD PHOS & ACET 6 (3-3) MG/ML IJ SUSP
12.0000 mg | Freq: Once | INTRAMUSCULAR | Status: AC
  Administered 2020-06-20: 12 mg

## 2020-06-20 NOTE — Progress Notes (Signed)
Erik Cook - 59 y.o. male MRN [id number]  Date of birth: 10-22-1960  Office Visit Note: Visit Date: 06/06/2020 PCP: Patient, No Pcp Per Referred by: Kerrin Champagne, MD  Subjective: Chief Complaint  Patient presents with  . Lower Back - Pain  . Left Leg - Pain  . Right Leg - Pain   HPI:  Erik Cook is a 59 y.o. male who comes in today at the request of Dr. [name] for planned Bilateral S1-2 Lumbar epidural steroid injection with fluoroscopic guidance.  The patient has failed conservative care including home exercise, medications, time and activity modification.  This injection will be diagnostic and hopefully therapeutic.  Please see requesting physician notes for further details and justification.   ROS Otherwise per HPI.  Assessment & Plan: Visit Diagnoses:  1. Radiculopathy due to lumbar intervertebral disc disorder     Plan: No additional findings.   Meds & Orders:  Meds ordered this encounter  Medications  . methylPREDNISolone acetate (DEPO-MEDROL) injection 80 mg    Orders Placed This Encounter  Procedures  . XR C-ARM NO REPORT  . Epidural Steroid injection    Follow-up: Return for visit to requesting physician as needed.   Procedures: No procedures performed  S1 Lumbosacral Transforaminal Epidural Steroid Injection - Sub-Pedicular Approach with Fluoroscopic Guidance   Patient: Erik Cook      Date of Birth: 1961-05-24 MRN: [id number] PCP: Patient, No Pcp Per      Visit Date: 06/06/2020   Universal Protocol:    Date/Time: 11/18/214:34 PM  Consent Given By: the patient  Position:  PRONE  Additional Comments: Vital signs were monitored before and after the procedure. Patient was prepped and draped in the usual sterile fashion. The correct patient, procedure, and site was verified.   Injection Procedure Details:  Procedure Site One Meds Administered:  Meds ordered this encounter  Medications  . methylPREDNISolone acetate (DEPO-MEDROL)  injection 80 mg    Laterality: Bilateral  Location/Site:  S1 Foramen   Needle size: 22 ga.  Needle type: Spinal  Needle Placement: Transforaminal  Findings:   -Comments: Excellent flow of contrast along the nerve and into the epidural space.  Procedure Details: After squaring off the sacral end-plate to get a true AP view, the C-arm was positioned so that the best possible view of the S1 foramen was visualized. The soft tissues overlying this structure were infiltrated with 2-3 ml. of 1% Lidocaine without Epinephrine.    The spinal needle was inserted toward the target using a "trajectory" view along the fluoroscope beam.  Under AP and lateral visualization, the needle was advanced so it did not puncture dura. Biplanar projections were used to confirm position. Aspiration was confirmed to be negative for CSF and/or blood. A 1-2 ml. volume of Isovue-250 was injected and flow of contrast was noted at each level. Radiographs were obtained for documentation purposes.   After attaining the desired flow of contrast documented above, a 0.5 to 1.0 ml test dose of 0.25% Marcaine was injected into each respective transforaminal space.  The patient was observed for 90 seconds post injection.  After no sensory deficits were reported, and normal lower extremity motor function was noted,   the above injectate was administered so that equal amounts of the injectate were placed at each foramen (level) into the transforaminal epidural space.   Additional Comments:  The patient tolerated the procedure well Dressing: Band-Aid with 2 x 2 sterile gauze    Post-procedure details: Patient was observed  during the procedure. Post-procedure instructions were reviewed.  Patient left the clinic in stable condition.     Clinical History: MRI CERVICAL SPINE WITHOUT CONTRAST  TECHNIQUE: Multiplanar, multisequence MR imaging of the cervical spine was performed. No intravenous contrast was  administered.  COMPARISON:  01/25/2019  FINDINGS: Alignment: Normal  Vertebrae: C4-5 ACDF. No acute abnormality. Mild facet edema on the left at C3-4.  Cord: Normal  Posterior Fossa, vertebral arteries, paraspinal tissues: Normal  Disc levels:  C1-2: Unremarkable.  C2-3: Normal disc space and facet joints. There is no spinal canal stenosis. No neural foraminal stenosis.  C3-4: Mild left facet edema is new since the prior study. There is mild bilateral uncovertebral hypertrophy. There is no spinal canal stenosis. Mild bilateral neural foraminal stenosis.  C4-5: ACDF. There is no spinal canal stenosis. No neural foraminal stenosis.  C5-6: Small disc bulge. Unchanged right uncovertebral osteophyte. There is no spinal canal stenosis. Moderate right neural foraminal stenosis.  C6-7: Left asymmetric disc bulge with left-greater-than-right uncovertebral hypertrophy. There is no spinal canal stenosis. Mild right and moderate left, unchanged neural foraminal stenosis.  C7-T1: Normal disc space and facet joints. There is no spinal canal stenosis. No neural foraminal stenosis.  IMPRESSION: 1. Mild left C3-4 facet edema is new since the prior study and could be a source of local neck pain. 2. Moderate right C5-6 neural foraminal stenosis, unchanged. 3. Mild right and moderate left C6-7 neural foraminal stenosis, unchanged.   Electronically Signed   By: Erik Cook M.D.   On: 03/18/2020 19:31 --------------------------  MRI LUMBAR SPINE WITHOUT CONTRAST    TECHNIQUE:  Multiplanar, multisequence MR imaging of the lumbar spine was  performed. No intravenous contrast was administered.    COMPARISON: Radiography 12/30/2017    FINDINGS:  Segmentation: 5 lumbar type vertebral bodies    Alignment: Mild anterolisthesis at L5-S1    Vertebrae: No fracture, evidence of discitis, or aggressive bone  lesion. L2 hemangioma    Conus medullaris and cauda  equina: Conus extends to the L1 level.  Conus and cauda equina appear normal.    Paraspinal and other soft tissues: Negative    Disc levels:    T12- L1: Unremarkable.    L1-L2: Mild facet hypertrophy. No impingement    L2-L3: Disc narrowing and bulging. Mild facet hypertrophy.  Noncompressive spinal canal and foraminal narrowing.    L3-L4: Disc narrowing and bulging. Mild facet spurring. Right  foraminal impingement. Mild spinal stenosis.    L4-L5: Disc narrowing and bulging with endplate spurring. Negative  facets. Noncompressive bilateral foraminal narrowing. Mild spinal  stenosis    L5-S1:Mild degenerative facet spurring. Mild disc bulging. There is  left subarticular recess narrowing without convincing S1  impingement.    IMPRESSION:  1. Disc narrowing and bulging at L2-3 to L4-5 with mild spinal  stenosis.  2. L3-4 moderate to advanced right foraminal stenosis. There is  noncompressive foraminal narrowing the other levels of disc  degeneration.      Electronically Signed  By: Marnee Spring M.D.  On: 04/17/2018 10:18     Objective:  VS:  HT:    WT:   BMI:     BP:(!) 137/96  HR:78bpm  TEMP: ( )  RESP:  Physical Exam Constitutional:      General: He is not in acute distress.    Appearance: Normal appearance. He is not ill-appearing.  HENT:     Head: Normocephalic and atraumatic.     Right Ear: External ear normal.  Left Ear: External ear normal.  Eyes:     Extraocular Movements: Extraocular movements intact.  Cardiovascular:     Rate and Rhythm: Normal rate.     Pulses: Normal pulses.  Abdominal:     General: There is no distension.     Palpations: Abdomen is soft.  Musculoskeletal:        General: No tenderness or signs of injury.     Right lower leg: No edema.     Left lower leg: No edema.     Comments: Patient has good distal strength without clonus.  Skin:    Findings: No erythema or rash.  Neurological:     General: No focal  deficit present.     Mental Status: He is alert and oriented to person, place, and time.     Sensory: No sensory deficit.     Motor: No weakness or abnormal muscle tone.     Coordination: Coordination normal.  Psychiatric:        Mood and Affect: Mood normal.        Behavior: Behavior normal.      Imaging:

## 2020-06-20 NOTE — Progress Notes (Signed)
Erik Cook - 59 y.o. male MRN [id number]  Date of birth: January 18, 1961  Office Visit Note: Visit Date: 06/20/2020 PCP: Patient, No Pcp Per Referred by: No ref. provider found  Subjective: Chief Complaint  Patient presents with  . Neck - Pain   HPI:  Erik Cook is a 59 y.o. male who comes in today at the request of Dr. [name] for planned Left C7-T1 Cervical epidural steroid injection with fluoroscopic guidance.  The patient has failed conservative care including home exercise, medications, time and activity modification.  This injection will be diagnostic and hopefully therapeutic.  Please see requesting physician notes for further details and justification. MRI reviewed with images and spine model.  MRI reviewed in the note below.    ROS Otherwise per HPI.  Assessment & Plan: Visit Diagnoses:  1. Cervical radiculopathy     Plan: No additional findings.   Meds & Orders:  Meds ordered this encounter  Medications  . betamethasone acetate-betamethasone sodium phosphate (CELESTONE) injection 12 mg    Orders Placed This Encounter  Procedures  . XR C-ARM NO REPORT  . Epidural Steroid injection    Follow-up: Return for visit to requesting physician as needed.   Procedures: No procedures performed  Cervical Epidural Steroid Injection - Interlaminar Approach with Fluoroscopic Guidance  Patient: Erik Cook      Date of Birth: April 09, 1961 MRN: [id number] PCP: Patient, No Pcp Per      Visit Date: 06/20/2020   Universal Protocol:    Date/Time: 11/18/214:32 PM  Consent Given By: the patient  Position: PRONE  Additional Comments: Vital signs were monitored before and after the procedure. Patient was prepped and draped in the usual sterile fashion. The correct patient, procedure, and site was verified.   Injection Procedure Details:   Procedure diagnoses:  1. Cervical radiculopathy      Meds Administered:  Meds ordered this encounter  Medications  .  betamethasone acetate-betamethasone sodium phosphate (CELESTONE) injection 12 mg     Laterality: Left  Location/Site: C7-T1  Needle: 3.5 in., 20 ga. Tuohy  Needle Placement: Paramedian epidural space  Findings:  -Comments: Excellent flow of contrast into the epidural space.  Procedure Details: Using a paramedian approach from the side mentioned above, the region overlying the inferior lamina was localized under fluoroscopic visualization and the soft tissues overlying this structure were infiltrated with 4 ml. of 1% Lidocaine without Epinephrine. A # 20 gauge, Tuohy needle was inserted into the epidural space using a paramedian approach.  The epidural space was localized using loss of resistance along with contralateral oblique bi-planar fluoroscopic views.  After negative aspirate for air, blood, and CSF, a 2 ml. volume of Isovue-250 was injected into the epidural space and the flow of contrast was observed. Radiographs were obtained for documentation purposes.   The injectate was administered into the level noted above.  Additional Comments:  The patient tolerated the procedure well Dressing: 2 x 2 sterile gauze and Band-Aid    Post-procedure details: Patient was observed during the procedure. Post-procedure instructions were reviewed.  Patient left the clinic in stable condition.     Clinical History: MRI CERVICAL SPINE WITHOUT CONTRAST  TECHNIQUE: Multiplanar, multisequence MR imaging of the cervical spine was performed. No intravenous contrast was administered.  COMPARISON:  01/25/2019  FINDINGS: Alignment: Normal  Vertebrae: C4-5 ACDF. No acute abnormality. Mild facet edema on the left at C3-4.  Cord: Normal  Posterior Fossa, vertebral arteries, paraspinal tissues: Normal  Disc levels:  C1-2: Unremarkable.  C2-3: Normal disc space and facet joints. There is no spinal canal stenosis. No neural foraminal stenosis.  C3-4: Mild left facet edema  is new since the prior study. There is mild bilateral uncovertebral hypertrophy. There is no spinal canal stenosis. Mild bilateral neural foraminal stenosis.  C4-5: ACDF. There is no spinal canal stenosis. No neural foraminal stenosis.  C5-6: Small disc bulge. Unchanged right uncovertebral osteophyte. There is no spinal canal stenosis. Moderate right neural foraminal stenosis.  C6-7: Left asymmetric disc bulge with left-greater-than-right uncovertebral hypertrophy. There is no spinal canal stenosis. Mild right and moderate left, unchanged neural foraminal stenosis.  C7-T1: Normal disc space and facet joints. There is no spinal canal stenosis. No neural foraminal stenosis.  IMPRESSION: 1. Mild left C3-4 facet edema is new since the prior study and could be a source of local neck pain. 2. Moderate right C5-6 neural foraminal stenosis, unchanged. 3. Mild right and moderate left C6-7 neural foraminal stenosis, unchanged.   Electronically Signed   By: Deatra Robinson M.D.   On: 03/18/2020 19:31 --------------------------  MRI LUMBAR SPINE WITHOUT CONTRAST    TECHNIQUE:  Multiplanar, multisequence MR imaging of the lumbar spine was  performed. No intravenous contrast was administered.    COMPARISON: Radiography 12/30/2017    FINDINGS:  Segmentation: 5 lumbar type vertebral bodies    Alignment: Mild anterolisthesis at L5-S1    Vertebrae: No fracture, evidence of discitis, or aggressive bone  lesion. L2 hemangioma    Conus medullaris and cauda equina: Conus extends to the L1 level.  Conus and cauda equina appear normal.    Paraspinal and other soft tissues: Negative    Disc levels:    T12- L1: Unremarkable.    L1-L2: Mild facet hypertrophy. No impingement    L2-L3: Disc narrowing and bulging. Mild facet hypertrophy.  Noncompressive spinal canal and foraminal narrowing.    L3-L4: Disc narrowing and bulging. Mild facet spurring. Right  foraminal  impingement. Mild spinal stenosis.    L4-L5: Disc narrowing and bulging with endplate spurring. Negative  facets. Noncompressive bilateral foraminal narrowing. Mild spinal  stenosis    L5-S1:Mild degenerative facet spurring. Mild disc bulging. There is  left subarticular recess narrowing without convincing S1  impingement.    IMPRESSION:  1. Disc narrowing and bulging at L2-3 to L4-5 with mild spinal  stenosis.  2. L3-4 moderate to advanced right foraminal stenosis. There is  noncompressive foraminal narrowing the other levels of disc  degeneration.      Electronically Signed  By: Marnee Spring M.D.  On: 04/17/2018 10:18     Objective:  VS:  HT:    WT:   BMI:     BP:(!) 134/96  HR:85bpm  TEMP: ( )  RESP:  Physical Exam Vitals and nursing note reviewed.  Constitutional:      General: He is not in acute distress.    Appearance: Normal appearance. He is not ill-appearing.  HENT:     Head: Normocephalic and atraumatic.     Right Ear: External ear normal.     Left Ear: External ear normal.  Eyes:     Extraocular Movements: Extraocular movements intact.  Cardiovascular:     Rate and Rhythm: Normal rate.     Pulses: Normal pulses.  Abdominal:     General: There is no distension.     Palpations: Abdomen is soft.  Musculoskeletal:        General: No signs of injury.     Cervical back: Neck supple. Tenderness present.  No rigidity.     Right lower leg: No edema.     Left lower leg: No edema.     Comments: Patient has good strength in the upper extremities with 5 out of 5 strength in wrist extension long finger flexion APB.  No intrinsic hand muscle atrophy.  Negative Hoffmann's test.  Lymphadenopathy:     Cervical: No cervical adenopathy.  Skin:    Findings: No erythema or rash.  Neurological:     General: No focal deficit present.     Mental Status: He is alert and oriented to person, place, and time.     Sensory: No sensory deficit.     Motor: No weakness or  abnormal muscle tone.     Coordination: Coordination normal.  Psychiatric:        Mood and Affect: Mood normal.        Behavior: Behavior normal.      Imaging: XR C-ARM NO REPORT  Result Date: 06/20/2020 Please see Notes tab for imaging impression.

## 2020-06-20 NOTE — Procedures (Signed)
S1 Lumbosacral Transforaminal Epidural Steroid Injection - Sub-Pedicular Approach with Fluoroscopic Guidance   Patient: Erik Cook      Date of Birth: 09-07-1960 MRN: [id number] PCP: Patient, No Pcp Per      Visit Date: 06/06/2020   Universal Protocol:    Date/Time: 11/18/214:34 PM  Consent Given By: the patient  Position:  PRONE  Additional Comments: Vital signs were monitored before and after the procedure. Patient was prepped and draped in the usual sterile fashion. The correct patient, procedure, and site was verified.   Injection Procedure Details:  Procedure Site One Meds Administered:  Meds ordered this encounter  Medications  . methylPREDNISolone acetate (DEPO-MEDROL) injection 80 mg    Laterality: Bilateral  Location/Site:  S1 Foramen   Needle size: 22 ga.  Needle type: Spinal  Needle Placement: Transforaminal  Findings:   -Comments: Excellent flow of contrast along the nerve and into the epidural space.  Procedure Details: After squaring off the sacral end-plate to get a true AP view, the C-arm was positioned so that the best possible view of the S1 foramen was visualized. The soft tissues overlying this structure were infiltrated with 2-3 ml. of 1% Lidocaine without Epinephrine.    The spinal needle was inserted toward the target using a "trajectory" view along the fluoroscope beam.  Under AP and lateral visualization, the needle was advanced so it did not puncture dura. Biplanar projections were used to confirm position. Aspiration was confirmed to be negative for CSF and/or blood. A 1-2 ml. volume of Isovue-250 was injected and flow of contrast was noted at each level. Radiographs were obtained for documentation purposes.   After attaining the desired flow of contrast documented above, a 0.5 to 1.0 ml test dose of 0.25% Marcaine was injected into each respective transforaminal space.  The patient was observed for 90 seconds post injection.  After no  sensory deficits were reported, and normal lower extremity motor function was noted,   the above injectate was administered so that equal amounts of the injectate were placed at each foramen (level) into the transforaminal epidural space.   Additional Comments:  The patient tolerated the procedure well Dressing: Band-Aid with 2 x 2 sterile gauze    Post-procedure details: Patient was observed during the procedure. Post-procedure instructions were reviewed.  Patient left the clinic in stable condition.

## 2020-06-20 NOTE — Progress Notes (Signed)
Pt state he has neck pain. Pt state sleeping at night the pain wakes him up. Pt state he takes pain meds and use heating pads to help ease the pain.   Numeric Pain Rating Scale and Functional Assessment Average Pain 7   In the last MONTH (on 0-10 scale) has pain interfered with the following?  1. General activity like being  able to carry out your everyday physical activities such as walking, climbing stairs, carrying groceries, or moving a chair?  Rating(10)   +Driver, -BT, -Dye Allergies.

## 2020-06-20 NOTE — Procedures (Signed)
Cervical Epidural Steroid Injection - Interlaminar Approach with Fluoroscopic Guidance  Patient: Erik Cook      Date of Birth: 1960/11/28 MRN: [id number] PCP: Patient, No Pcp Per      Visit Date: 06/20/2020   Universal Protocol:    Date/Time: 11/18/214:32 PM  Consent Given By: the patient  Position: PRONE  Additional Comments: Vital signs were monitored before and after the procedure. Patient was prepped and draped in the usual sterile fashion. The correct patient, procedure, and site was verified.   Injection Procedure Details:   Procedure diagnoses:  1. Cervical radiculopathy      Meds Administered:  Meds ordered this encounter  Medications  . betamethasone acetate-betamethasone sodium phosphate (CELESTONE) injection 12 mg     Laterality: Left  Location/Site: C7-T1  Needle: 3.5 in., 20 ga. Tuohy  Needle Placement: Paramedian epidural space  Findings:  -Comments: Excellent flow of contrast into the epidural space.  Procedure Details: Using a paramedian approach from the side mentioned above, the region overlying the inferior lamina was localized under fluoroscopic visualization and the soft tissues overlying this structure were infiltrated with 4 ml. of 1% Lidocaine without Epinephrine. A # 20 gauge, Tuohy needle was inserted into the epidural space using a paramedian approach.  The epidural space was localized using loss of resistance along with contralateral oblique bi-planar fluoroscopic views.  After negative aspirate for air, blood, and CSF, a 2 ml. volume of Isovue-250 was injected into the epidural space and the flow of contrast was observed. Radiographs were obtained for documentation purposes.   The injectate was administered into the level noted above.  Additional Comments:  The patient tolerated the procedure well Dressing: 2 x 2 sterile gauze and Band-Aid    Post-procedure details: Patient was observed during the procedure. Post-procedure  instructions were reviewed.  Patient left the clinic in stable condition.

## 2020-06-24 ENCOUNTER — Ambulatory Visit: Payer: Medicare HMO | Admitting: Specialist

## 2020-06-26 ENCOUNTER — Encounter: Payer: Self-pay | Admitting: Specialist

## 2020-06-26 ENCOUNTER — Ambulatory Visit: Payer: Medicare HMO | Admitting: Specialist

## 2020-06-26 ENCOUNTER — Other Ambulatory Visit: Payer: Self-pay

## 2020-06-26 VITALS — BP 156/102 | HR 71 | Ht 69.0 in | Wt 200.0 lb

## 2020-06-26 DIAGNOSIS — M25461 Effusion, right knee: Secondary | ICD-10-CM

## 2020-06-26 DIAGNOSIS — M2391 Unspecified internal derangement of right knee: Secondary | ICD-10-CM

## 2020-06-26 DIAGNOSIS — M48062 Spinal stenosis, lumbar region with neurogenic claudication: Secondary | ICD-10-CM | POA: Diagnosis not present

## 2020-06-26 DIAGNOSIS — M4722 Other spondylosis with radiculopathy, cervical region: Secondary | ICD-10-CM | POA: Diagnosis not present

## 2020-06-26 DIAGNOSIS — M5416 Radiculopathy, lumbar region: Secondary | ICD-10-CM | POA: Diagnosis not present

## 2020-06-26 DIAGNOSIS — M5481 Occipital neuralgia: Secondary | ICD-10-CM

## 2020-06-26 DIAGNOSIS — M4322 Fusion of spine, cervical region: Secondary | ICD-10-CM | POA: Diagnosis not present

## 2020-06-26 NOTE — Progress Notes (Signed)
Office Visit Note   Patient: Erik Cook           Date of Birth: [birth year]           MRN: [id number] Visit Date: 06/26/2020              Requested by: No referring provider defined for this encounter. PCP: Patient, No Pcp Per   Assessment & Plan: Visit Diagnoses:  1. Spondylosis of cervical spine with radiculopathy   2. Spinal stenosis of lumbar region with neurogenic claudication   3. Other spondylosis with radiculopathy, cervical region   4. Fusion of spine, cervical region   5. Radiculopathy, lumbar region   6. Bilateral occipital neuralgia   7. Effusion, right knee   8. Acute internal derangement of right knee     Plan: Avoid bending, stooping and avoid lifting weights greater than 10 lbs. Avoid prolong standing and walking. Avoid frequent bending and stooping  No lifting greater than 10 lbs. May use ice or moist heat for pain. Weight loss is of benefit. Handicap license is approved. Call if your pain recurrs and we would consider further Dr. [place] Blas secretary/Assistant will call to arrange for epidural steroid injection. If injection do not provide relief then consideration of L3-4 and L4-5 fusions and decompression. 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 Well padded shoes help. Ice the knee that is suffering from osteoarthritis, only real proven treatments are Weight loss, NSIADs like diclofenac and exercise. Well padded shoes help. Ice the knee 2-3 times a day 15-20 mins at a time. 3 times a day 15-20 mins at a time. Hot showers in the AM.  Injection with steroid may be of benefit. Hemp CBD capsules, amazon.com 5,000-7,000 mg per bottle, 60 capsules per bottle, take one capsule twice a day. Cane in the left hand to use with left leg weight bearing. Visit the AAOS web site or google AAOS guidelines for knee osteoarthritis. Follow-Up Instructions: No follow-ups on file.     Follow-Up Instructions: No follow-ups on file.   Orders:  No orders of the defined types were placed in this encounter.  No orders of the defined types were placed in this encounter.     Procedures: No procedures performed   Clinical Data: No additional findings.   Subjective: Chief Complaint  Patient presents with  . Neck - Follow-up    Had left C7-T1 IL injection--got 60% relief  . Lower Back - Follow-up    Had Bilat S1 TF states that he got 60% relief with this injection also    59 year old male right handed with work related cervical and lumbar disorders, spondylosis changes of the cervical spine and lumbar spondylosis and SI pain. Underwent cervical ESI with good improvement there and about 80% improvement with SI joint injection. He is In the process of getting his home ready for sell and he is getting ready to move to [place]. Overall he is feeling better. Meds include baclofen and tylenol and advil or ibuprofen.   Review of Systems   Objective: Vital Signs: BP (!) 156/102 (BP Location: Left Arm, Patient Position: Sitting)   Pulse 71   Ht 5\' 9"  (1.753 m)   Wt 200 lb (90.7 kg)   BMI 29.53 kg/m   Physical Exam  Ortho Exam  Specialty Comments:  No specialty comments available.  Imaging: No results found.   PMFS History: There are no problems to display for this  patient.  Past Medical History:  Diagnosis Date  . Hypertension     History reviewed. No pertinent family history.  Past Surgical History:  Procedure Laterality Date  . 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 KNEE ARTHROSCOPY     left  . KNEE ARTHROSCOPY     on left side again  . ROTATOR CUFF REPAIR     left  . ROTATOR CUFF REPAIR     2nd time around (after fall)    Social History   Occupational History  . Not on file  Tobacco Use  . Smoking status: Never Smoker  . Smokeless tobacco: Never Used  Substance and Sexual Activity  . Alcohol use: No  . Drug use: No  . Sexual activity: Not on file

## 2020-06-26 NOTE — Patient Instructions (Signed)
Avoid bending, stooping and avoid lifting weights greater than 10 lbs. Avoid prolong standing and walking. Avoid frequent bending and stooping  No lifting greater than 10 lbs. May use ice or moist heat for pain. Weight loss is of benefit. Handicap license is approved. Call if your pain recurrs and we would consider further Dr. [place] Blas secretary/Assistant will call to arrange for epidural steroid injection. If injection do not provide relief then consideration of L3-4 and L4-5 fusions and decompression. 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 Well padded shoes help. Ice the knee that is suffering from osteoarthritis, only real proven treatments are Weight loss, NSIADs like diclofenac and exercise. Well padded shoes help. Ice the knee 2-3 times a day 15-20 mins at a time. 3 times a day 15-20 mins at a time. Hot showers in the AM.  Injection with steroid may be of benefit. Hemp CBD capsules, amazon.com 5,000-7,000 mg per bottle, 60 capsules per bottle, take one capsule twice a day. Cane in the left hand to use with left leg weight bearing. Visit the AAOS web site or google AAOS guidelines for knee osteoarthritis. Follow-Up Instructions: No follow-ups on file.

## 2020-07-02 ENCOUNTER — Encounter: Payer: Self-pay | Admitting: Orthopedic Surgery

## 2020-07-03 ENCOUNTER — Telehealth: Payer: Self-pay

## 2020-07-03 NOTE — Telephone Encounter (Signed)
Approved, Gel-One right knee. Buy & Bill Patient will be responsible for 20% OOP. Co-pay of $25.00 PA required PA Approval# [id number] Valid 06/07/2020- 09/07/2020  Appt. 07/12/2020 with Dr. August Saucer

## 2020-07-03 NOTE — Telephone Encounter (Signed)
Appointment scheduled for gel injection.

## 2020-07-12 ENCOUNTER — Ambulatory Visit: Payer: Medicare HMO | Admitting: Orthopedic Surgery

## 2020-07-12 DIAGNOSIS — M1711 Unilateral primary osteoarthritis, right knee: Secondary | ICD-10-CM

## 2020-07-13 ENCOUNTER — Encounter: Payer: Self-pay | Admitting: Orthopedic Surgery

## 2020-07-13 DIAGNOSIS — M1711 Unilateral primary osteoarthritis, right knee: Secondary | ICD-10-CM | POA: Diagnosis not present

## 2020-07-13 MED ORDER — CROSS-LINK HYAL ACID (VISC) 30 MG/3ML IX PRSY
30.0000 mg | PREFILLED_SYRINGE | INTRA_ARTICULAR | Status: AC | PRN
  Administered 2020-07-13: 07:00:00 30 mg via INTRA_ARTICULAR

## 2020-07-13 MED ORDER — LIDOCAINE HCL 1 % IJ SOLN
5.0000 mL | INTRAMUSCULAR | Status: AC | PRN
  Administered 2020-07-13: 07:00:00 5 mL

## 2020-07-13 NOTE — Progress Notes (Signed)
   Procedure Note  Patient: Erik Cook             Date of Birth: 1961-02-22           MRN: [id number]             Visit Date: 07/12/2020  Procedures: Visit Diagnoses:  1. Arthritis of right knee     Large Joint Inj: R knee on 07/13/2020 7:07 AM Indications: diagnostic evaluation, joint swelling and pain Details: 18 G 1.5 in needle, superolateral approach  Arthrogram: No  Medications: 5 mL lidocaine 1 %; 30 mg Cross-Linked Hyaluronate 30 MG/3ML Outcome: tolerated well, no immediate complications Procedure, treatment alternatives, risks and benefits explained, specific risks discussed. Consent was given by the patient. Immediately prior to procedure a time out was called to verify the correct patient, procedure, equipment, support staff and site/side marked as required. Patient was prepped and draped in the usual sterile fashion.

## 2020-10-18 ENCOUNTER — Telehealth: Payer: Self-pay | Admitting: Physical Medicine and Rehabilitation

## 2020-10-18 NOTE — Telephone Encounter (Signed)
Pt had Bil S1 TF injection on 06/06/20, same problem and no injury. Please Advise

## 2020-10-18 NOTE — Telephone Encounter (Signed)
Patient called needing to schedule an appointment with Dr. Newton for his lower back. The number to contact patient is [phone] 

## 2020-10-21 NOTE — Telephone Encounter (Signed)
Okay to repeat.

## 2020-10-24 NOTE — Telephone Encounter (Signed)
Pt insurance is PENDING. 

## 2020-10-26 ENCOUNTER — Encounter: Payer: Self-pay | Admitting: Physical Medicine and Rehabilitation

## 2020-10-28 NOTE — Telephone Encounter (Signed)
Pt insurance still PENDING. 

## 2020-10-29 ENCOUNTER — Telehealth: Payer: Self-pay

## 2020-10-29 NOTE — Telephone Encounter (Signed)
Peer/peer

## 2020-10-29 NOTE — Telephone Encounter (Signed)
Called pt and he inform me that he does do Yoga at home on his own. Paperwork on desk.

## 2020-10-29 NOTE — Telephone Encounter (Signed)
Pt insurance denied, needing documentation.

## 2020-10-31 NOTE — Telephone Encounter (Signed)
Submitted clinic notes/still PENDING

## 2020-10-31 NOTE — Telephone Encounter (Signed)
Please use this documentation for consideration of preauthorization for bilateral S1 transforaminal injection.  Please note that the patient had this procedure done on 06/06/2020 and received 80% relief up until early March.  Patient called in to have this repeated.  In the interim he has seen his spine surgeon Dr. Louanne Skye whose notes can be reviewed and sent in for preauthorization as well.  Patient's had chronic back pain with only relief coming from epidural injection they seem to last 3 to 4 months.  He has been doing fairly well with that.  He has had prior cervical fusion and is trying to avoid any lumbar surgery.  His MRI can be fully reviewed in the notes for authorization purposes.  He has been following a home program for quite a while.  He does yoga and physical therapy exercises.  He has had multiple bouts of physical therapy in the past but not specifically recently.  He has been dealing with knee pain and shoulder pain and neck pain as well.  He is a chronic pain patient but he is very active individual.  Home exercises have been directed and we talked him about those.  All other criteria should be met for repeat injection.  These are being used very infrequently as part of a comprehensive pain program including access to behavioral health if needed and pain psychology.

## 2020-11-01 ENCOUNTER — Telehealth: Payer: Self-pay | Admitting: Physical Medicine and Rehabilitation

## 2020-11-01 NOTE — Telephone Encounter (Signed)
Patient returned call asked for a call back as soon as possible. Patient asked if a message is lefted for him to please leave a detailed message as the urgency of the call. The number to contact patient is [phone]

## 2020-11-01 NOTE — Telephone Encounter (Signed)
Called pt and LVM #1 

## 2020-11-01 NOTE — Telephone Encounter (Signed)
Pt was approve Auth# [id number]

## 2020-11-04 ENCOUNTER — Telehealth: Payer: Self-pay | Admitting: Physical Medicine and Rehabilitation

## 2020-11-04 NOTE — Telephone Encounter (Signed)
Scheduled for 4/7 with driver. 

## 2020-11-04 NOTE — Telephone Encounter (Signed)
Scheduled for 4/7 at 0815 with driver.

## 2020-11-04 NOTE — Telephone Encounter (Signed)
Pt called and states he's trying to get scheduled with Dr.Newton and has left 3 or 4 messages and he said no one has called him back. Please give him a call at [phone]

## 2020-11-04 NOTE — Telephone Encounter (Signed)
Scheduled for 4/7 with driver.

## 2020-11-07 ENCOUNTER — Other Ambulatory Visit: Payer: Self-pay

## 2020-11-07 ENCOUNTER — Ambulatory Visit: Payer: Medicare HMO | Admitting: Physical Medicine and Rehabilitation

## 2020-11-07 ENCOUNTER — Ambulatory Visit: Payer: Self-pay

## 2020-11-07 ENCOUNTER — Encounter: Payer: Self-pay | Admitting: Physical Medicine and Rehabilitation

## 2020-11-07 VITALS — BP 120/84 | HR 78

## 2020-11-07 DIAGNOSIS — M5136 Other intervertebral disc degeneration, lumbar region: Secondary | ICD-10-CM | POA: Diagnosis not present

## 2020-11-07 DIAGNOSIS — M5116 Intervertebral disc disorders with radiculopathy, lumbar region: Secondary | ICD-10-CM | POA: Diagnosis not present

## 2020-11-07 MED ORDER — BETAMETHASONE SOD PHOS & ACET 6 (3-3) MG/ML IJ SUSP
12.0000 mg | Freq: Once | INTRAMUSCULAR | Status: AC
  Administered 2020-11-07: 12 mg

## 2020-11-07 NOTE — Progress Notes (Signed)
Pt state lower back pain that travels down both legs. Pt state sitting makes the pain worse. Pt state he take over the counter pain meds to help ease the pain.    Numeric Pain Rating Scale and Functional Assessment Average Pain 8   In the last MONTH (on 0-10 scale) has pain interfered with the following?  1. General activity like being  able to carry out your everyday physical activities such as walking, climbing stairs, carrying groceries, or moving a chair?  Rating(9)   +Driver, -BT, -Dye Allergies.

## 2020-11-07 NOTE — Procedures (Signed)
S1 Lumbosacral Transforaminal Epidural Steroid Injection - Sub-Pedicular Approach with Fluoroscopic Guidance   Patient: Erik Cook      Date of Birth: [birth year] MRN: [id number] PCP: Patient, No Pcp Per (Inactive)      Visit Date: 11/07/2020   Universal Protocol:    Date/Time: 04/07/228:24 AM  Consent Given By: the patient  Position:  PRONE  Additional Comments: Vital signs were monitored before and after the procedure. Patient was prepped and draped in the usual sterile fashion. The correct patient, procedure, and site was verified.   Injection Procedure Details:  Procedure Site One Meds Administered:  Meds ordered this encounter  Medications  . betamethasone acetate-betamethasone sodium phosphate (CELESTONE) injection 12 mg    Laterality: Bilateral  Location/Site:  S1 Foramen   Needle size: 22 ga.  Needle type: Spinal  Needle Placement: Transforaminal  Findings:   -Comments: Excellent flow of contrast along the nerve, nerve root and into the epidural space.  Epidurogram: Contrast epidurogram showed no nerve root cut off or restricted flow pattern.  Procedure Details: After squaring off the sacral end-plate to get a true AP view, the C-arm was positioned so that the best possible view of the S1 foramen was visualized. The soft tissues overlying this structure were infiltrated with 2-3 ml. of 1% Lidocaine without Epinephrine.    The spinal needle was inserted toward the target using a "trajectory" view along the fluoroscope beam.  Under AP and lateral visualization, the needle was advanced so it did not puncture dura. Biplanar projections were used to confirm position. Aspiration was confirmed to be negative for CSF and/or blood. A 1-2 ml. volume of Isovue-250 was injected and flow of contrast was noted at each level. Radiographs were obtained for documentation purposes.   After attaining the desired flow of contrast documented above, a 0.5 to 1.0 ml test dose of  0.25% Marcaine was injected into each respective transforaminal space.  The patient was observed for 90 seconds post injection.  After no sensory deficits were reported, and normal lower extremity motor function was noted,   the above injectate was administered so that equal amounts of the injectate were placed at each foramen (level) into the transforaminal epidural space.   Additional Comments:  The patient tolerated the procedure well Dressing: Band-Aid with 2 x 2 sterile gauze    Post-procedure details: Patient was observed during the procedure. Post-procedure instructions were reviewed.  Patient left the clinic in stable condition.

## 2020-11-07 NOTE — Patient Instructions (Signed)

## 2020-11-07 NOTE — Progress Notes (Signed)
Erik Cook - 60 y.o. male MRN [id number]  Date of birth: 1961/05/11  Office Visit Note: Visit Date: 11/07/2020 PCP: Patient, No Pcp Per (Inactive) Referred by: No ref. provider found  Subjective: Chief Complaint  Patient presents with  . Lower Back - Pain  . Left Leg - Pain  . Right Leg - Pain   HPI:  Erik Cook is a 60 y.o. male who comes in today for planned repeat Bilateral S1-2 Lumbar epidural steroid injection with fluoroscopic guidance.  The patient has failed conservative care including home exercise, medications, time and activity modification.  This injection will be diagnostic and hopefully therapeutic.  Please see requesting physician notes for further details and justification.  Prior injection did offer more than 80% relief as noted above and did give the patient more functional ability for activities of daily living and was also beneficial in that it did reduce their medication requirement.  He has sustained relief for several months.  He has been recently weak doing in his house for sale and that has flared up his back pain.  They have had physical therapy and continue with home exercises including yoga and stretching.  Current medication management is not beneficial in increasing their functional status.  Procedures are done as part of a comprehensive orthopedic and pain management program with access to in-house orthopedics, spine surgery and physical therapy as well as access to [facility] biopsychosocial counseling if needed.   Referring: Dr. [name]   ROS Otherwise per HPI.  Assessment & Plan: Visit Diagnoses:    ICD-10-CM   1. Radiculopathy due to lumbar intervertebral disc disorder  M51.16 XR C-ARM NO REPORT    Epidural Steroid injection    betamethasone acetate-betamethasone sodium phosphate (CELESTONE) injection 12 mg  2. Other intervertebral disc degeneration, lumbar region  M51.36 XR C-ARM NO REPORT    Epidural Steroid injection     betamethasone acetate-betamethasone sodium phosphate (CELESTONE) injection 12 mg    Plan: No additional findings.   Meds & Orders:  Meds ordered this encounter  Medications  . betamethasone acetate-betamethasone sodium phosphate (CELESTONE) injection 12 mg    Orders Placed This Encounter  Procedures  . XR C-ARM NO REPORT  . Epidural Steroid injection    Follow-up: Return if symptoms worsen or fail to improve.   Procedures: No procedures performed  S1 Lumbosacral Transforaminal Epidural Steroid Injection - Sub-Pedicular Approach with Fluoroscopic Guidance   Patient: Erik Cook      Date of Birth: 1961-04-24 MRN: [id number] PCP: Patient, No Pcp Per (Inactive)      Visit Date: 11/07/2020   Universal Protocol:    Date/Time: 04/07/228:24 AM  Consent Given By: the patient  Position:  PRONE  Additional Comments: Vital signs were monitored before and after the procedure. Patient was prepped and draped in the usual sterile fashion. The correct patient, procedure, and site was verified.   Injection Procedure Details:  Procedure Site One Meds Administered:  Meds ordered this encounter  Medications  . betamethasone acetate-betamethasone sodium phosphate (CELESTONE) injection 12 mg    Laterality: Bilateral  Location/Site:  S1 Foramen   Needle size: 22 ga.  Needle type: Spinal  Needle Placement: Transforaminal  Findings:   -Comments: Excellent flow of contrast along the nerve, nerve root and into the epidural space.  Epidurogram: Contrast epidurogram showed no nerve root cut off or restricted flow pattern.  Procedure Details: After squaring off the sacral end-plate to get a true AP view, the C-arm was  positioned so that the best possible view of the S1 foramen was visualized. The soft tissues overlying this structure were infiltrated with 2-3 ml. of 1% Lidocaine without Epinephrine.    The spinal needle was inserted toward the target using a "trajectory" view  along the fluoroscope beam.  Under AP and lateral visualization, the needle was advanced so it did not puncture dura. Biplanar projections were used to confirm position. Aspiration was confirmed to be negative for CSF and/or blood. A 1-2 ml. volume of Isovue-250 was injected and flow of contrast was noted at each level. Radiographs were obtained for documentation purposes.   After attaining the desired flow of contrast documented above, a 0.5 to 1.0 ml test dose of 0.25% Marcaine was injected into each respective transforaminal space.  The patient was observed for 90 seconds post injection.  After no sensory deficits were reported, and normal lower extremity motor function was noted,   the above injectate was administered so that equal amounts of the injectate were placed at each foramen (level) into the transforaminal epidural space.   Additional Comments:  The patient tolerated the procedure well Dressing: Band-Aid with 2 x 2 sterile gauze    Post-procedure details: Patient was observed during the procedure. Post-procedure instructions were reviewed.  Patient left the clinic in stable condition.     Clinical History: MRI CERVICAL SPINE WITHOUT CONTRAST  TECHNIQUE: Multiplanar, multisequence MR imaging of the cervical spine was performed. No intravenous contrast was administered.  COMPARISON:  01/25/2019  FINDINGS: Alignment: Normal  Vertebrae: C4-5 ACDF. No acute abnormality. Mild facet edema on the left at C3-4.  Cord: Normal  Posterior Fossa, vertebral arteries, paraspinal tissues: Normal  Disc levels:  C1-2: Unremarkable.  C2-3: Normal disc space and facet joints. There is no spinal canal stenosis. No neural foraminal stenosis.  C3-4: Mild left facet edema is new since the prior study. There is mild bilateral uncovertebral hypertrophy. There is no spinal canal stenosis. Mild bilateral neural foraminal stenosis.  C4-5: ACDF. There is no spinal canal  stenosis. No neural foraminal stenosis.  C5-6: Small disc bulge. Unchanged right uncovertebral osteophyte. There is no spinal canal stenosis. Moderate right neural foraminal stenosis.  C6-7: Left asymmetric disc bulge with left-greater-than-right uncovertebral hypertrophy. There is no spinal canal stenosis. Mild right and moderate left, unchanged neural foraminal stenosis.  C7-T1: Normal disc space and facet joints. There is no spinal canal stenosis. No neural foraminal stenosis.  IMPRESSION: 1. Mild left C3-4 facet edema is new since the prior study and could be a source of local neck pain. 2. Moderate right C5-6 neural foraminal stenosis, unchanged. 3. Mild right and moderate left C6-7 neural foraminal stenosis, unchanged.   Electronically Signed   By: Deatra Robinson M.D.   On: 03/18/2020 19:31 --------------------------  MRI LUMBAR SPINE WITHOUT CONTRAST    TECHNIQUE:  Multiplanar, multisequence MR imaging of the lumbar spine was  performed. No intravenous contrast was administered.    COMPARISON: Radiography 12/30/2017    FINDINGS:  Segmentation: 5 lumbar type vertebral bodies    Alignment: Mild anterolisthesis at L5-S1    Vertebrae: No fracture, evidence of discitis, or aggressive bone  lesion. L2 hemangioma    Conus medullaris and cauda equina: Conus extends to the L1 level.  Conus and cauda equina appear normal.    Paraspinal and other soft tissues: Negative    Disc levels:    T12- L1: Unremarkable.    L1-L2: Mild facet hypertrophy. No impingement    L2-L3: Disc narrowing  and bulging. Mild facet hypertrophy.  Noncompressive spinal canal and foraminal narrowing.    L3-L4: Disc narrowing and bulging. Mild facet spurring. Right  foraminal impingement. Mild spinal stenosis.    L4-L5: Disc narrowing and bulging with endplate spurring. Negative  facets. Noncompressive bilateral foraminal narrowing. Mild spinal  stenosis    L5-S1:Mild  degenerative facet spurring. Mild disc bulging. There is  left subarticular recess narrowing without convincing S1  impingement.    IMPRESSION:  1. Disc narrowing and bulging at L2-3 to L4-5 with mild spinal  stenosis.  2. L3-4 moderate to advanced right foraminal stenosis. There is  noncompressive foraminal narrowing the other levels of disc  degeneration.      Electronically Signed  By: Marnee Spring M.D.  On: 04/17/2018 10:18     Objective:  VS:  HT:    WT:   BMI:     BP:120/84  HR:78bpm  TEMP: ( )  RESP:  Physical Exam Vitals and nursing note reviewed.  Constitutional:      General: He is not in acute distress.    Appearance: Normal appearance. He is not ill-appearing.  HENT:     Head: Normocephalic and atraumatic.     Right Ear: External ear normal.     Left Ear: External ear normal.     Nose: No congestion.  Eyes:     Extraocular Movements: Extraocular movements intact.  Cardiovascular:     Rate and Rhythm: Normal rate.     Pulses: Normal pulses.  Pulmonary:     Effort: Pulmonary effort is normal. No respiratory distress.  Abdominal:     General: There is no distension.     Palpations: Abdomen is soft.  Musculoskeletal:        General: No tenderness or signs of injury.     Cervical back: Neck supple.     Right lower leg: No edema.     Left lower leg: No edema.     Comments: Patient has good distal strength without clonus.  Skin:    Findings: No erythema or rash.  Neurological:     General: No focal deficit present.     Mental Status: He is alert and oriented to person, place, and time.     Sensory: No sensory deficit.     Motor: No weakness or abnormal muscle tone.     Coordination: Coordination normal.  Psychiatric:        Mood and Affect: Mood normal.        Behavior: Behavior normal.      Imaging: No results found.

## 2020-12-13 ENCOUNTER — Telehealth: Payer: Self-pay | Admitting: Specialist

## 2020-12-13 NOTE — Telephone Encounter (Signed)
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  I advised him would have you add him to this list. He said he is open to coming any time as long as you give him about an hour and half heads up since he lives over an hour from our office.

## 2020-12-13 NOTE — Telephone Encounter (Signed)
Patient called requesting a call back from Chritsy. Patient states he is upset that we do not have a cancellation list to put him on. He states this is an inconvience to him and would like a call back to see if the doctor's staff have a cancellation list. He also would like a call back from supervisor Leotis Shames F. To formally put in a complaint. Patient phone number is [phone].

## 2020-12-16 NOTE — Telephone Encounter (Signed)
I called and lmom for pt that I have put him on the cancellation list and will call if there is an opening

## 2020-12-23 ENCOUNTER — Telehealth: Payer: Self-pay | Admitting: Specialist

## 2020-12-23 NOTE — Telephone Encounter (Signed)
I tried to call again, it states that the call cannot be completed at this time to try again later,  I will try to call him 1 more time tomorrow.

## 2020-12-23 NOTE — Telephone Encounter (Signed)
Tried to call but there was no answer and no machine--I will try again later

## 2020-12-23 NOTE — Telephone Encounter (Signed)
Pt called and would like to speak with christy about his appt on Wednesday!

## 2020-12-24 NOTE — Telephone Encounter (Signed)
I called and spoke with patient he wanted to move his appt, moved to 01/03/21 @ 9

## 2020-12-25 ENCOUNTER — Ambulatory Visit: Payer: Medicare HMO | Admitting: Specialist

## 2021-01-03 ENCOUNTER — Encounter: Payer: Self-pay | Admitting: Specialist

## 2021-01-03 ENCOUNTER — Ambulatory Visit: Payer: Medicare HMO | Admitting: Specialist

## 2021-01-03 ENCOUNTER — Other Ambulatory Visit: Payer: Self-pay

## 2021-01-03 VITALS — BP 160/106 | HR 79 | Ht 69.0 in | Wt 200.0 lb

## 2021-01-03 DIAGNOSIS — M48062 Spinal stenosis, lumbar region with neurogenic claudication: Secondary | ICD-10-CM

## 2021-01-03 DIAGNOSIS — R202 Paresthesia of skin: Secondary | ICD-10-CM

## 2021-01-03 DIAGNOSIS — R29898 Other symptoms and signs involving the musculoskeletal system: Secondary | ICD-10-CM | POA: Diagnosis not present

## 2021-01-03 DIAGNOSIS — M25511 Pain in right shoulder: Secondary | ICD-10-CM | POA: Diagnosis not present

## 2021-01-03 DIAGNOSIS — M5030 Other cervical disc degeneration, unspecified cervical region: Secondary | ICD-10-CM

## 2021-01-03 DIAGNOSIS — R200 Anesthesia of skin: Secondary | ICD-10-CM | POA: Diagnosis not present

## 2021-01-03 DIAGNOSIS — M6281 Muscle weakness (generalized): Secondary | ICD-10-CM | POA: Diagnosis not present

## 2021-01-03 DIAGNOSIS — M4722 Other spondylosis with radiculopathy, cervical region: Secondary | ICD-10-CM

## 2021-01-03 DIAGNOSIS — M5481 Occipital neuralgia: Secondary | ICD-10-CM

## 2021-01-03 MED ORDER — METHYLPREDNISOLONE ACETATE 40 MG/ML IJ SUSP
40.0000 mg | INTRAMUSCULAR | Status: AC | PRN
  Administered 2021-01-03: 40 mg via INTRA_ARTICULAR

## 2021-01-03 MED ORDER — BUPIVACAINE HCL 0.5 % IJ SOLN
3.0000 mL | INTRAMUSCULAR | Status: AC | PRN
  Administered 2021-01-03: 3 mL via INTRA_ARTICULAR

## 2021-01-03 NOTE — Progress Notes (Signed)
Office Visit Note   Patient: Erik Cook           Date of Birth: 06-16-1961           MRN: [id number] Visit Date: 01/03/2021              Requested by: No referring provider defined for this encounter. PCP: Patient, No Pcp Per (Inactive)   Assessment & Plan: Visit Diagnoses:  1. Acute pain of right shoulder   2. Weakness of shoulder   3. Numbness and tingling of right arm   4. Muscle right arm weakness   5. Spondylosis of cervical spine with radiculopathy   6. Spinal stenosis of lumbar region with neurogenic claudication   7. Degenerative disc disease, cervical   8. Bilateral occipital neuralgia     Plan: Avoid overhead lifting and overhead use of the arms. Do not lift greater than 10 lbs. Tylenol ES one every 6-8 hours for pain and inflamation. MRI of the right shoulder ordered due to weakness not improved with blocking cuff and  Persistent weakness 10 weeks post fall. EMG/NCV right arm ordered due to numbness and paresthesias and weakness consistent with radiculopathy vs brachial plexopathy.  Avoid bending, stooping and avoid lifting weights greater than 10 lbs. Avoid prolong standing and walking. Avoid frequent bending and stooping  No lifting greater than 10 lbs. May use ice or moist heat for pain. Weight loss is of benefit. Handicap license is approved.  Follow-Up Instructions: Return in about 3 weeks (around 01/24/2021).   Orders:  Orders Placed This Encounter  Procedures  . Large Joint Inj: R subacromial bursa  . MR SHOULDER RIGHT WO CONTRAST  . Ambulatory referral to Physical Medicine Rehab   No orders of the defined types were placed in this encounter.     Procedures: Large Joint Inj: R subacromial bursa on 01/03/2021 10:34 AM Indications: pain Details: 25 G 1.5 in needle, posterior approach  Arthrogram: No  Medications: 40 mg methylPREDNISolone acetate 40 MG/ML; 3 mL bupivacaine 0.5 % Outcome: tolerated well, no immediate complications Procedure,  treatment alternatives, risks and benefits explained, specific risks discussed. Consent was given by the patient. Immediately prior to procedure a time out was called to verify the correct patient, procedure, equipment, support staff and site/side marked as required. Patient was prepped and draped in the usual sterile fashion.       Clinical Data: No additional findings.   Subjective: Chief Complaint  Patient presents with  . Neck - Follow-up  . Lower Back - Follow-up    Had bilat S1 TF on 11/07/20 with Dr. Alvester Morin    60 year old male with history of cervical spondylosis and lumbar spondylosis with right L3-4 foramenal narrowing. Removing stuff from the house and he reportedly fell out of the back of the truck. He is selling his home and is having to replace a septic tank or expand it. Apparently he the system is prevent. He is experiencing numbness into his right arm is experiencing numbness some into the thumb but mainly into the right long through little finger. He is having difficulty holding the weed eater with his arm. He reports he really can't do anything until he sells his house. He is depending on selling his home to make ends meet. No bowel or bladder difficulty. He had lumbar ESI and it helped. 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 Injection Of the back gave about 80% improved. Right shoulder pain and right arm  numbness since fall from bed of his Truck about 9-10 weeks ago. He had his left knee give away and he fell forward onto mainly the right open palm of hand.    Review of Systems  Constitutional: Negative.   HENT: Negative.   Eyes: Negative.   Respiratory: Negative.   Cardiovascular: Negative.   Gastrointestinal: Negative.   Endocrine: Negative.   Genitourinary: Negative.   Musculoskeletal: Negative.   Skin: Negative.   Allergic/Immunologic: Negative.   Neurological: Negative.   Hematological: Negative.   Psychiatric/Behavioral: Negative.       Objective: Vital Signs: BP (!) 160/106 (BP Location: Left Arm, Patient Position: Sitting)   Pulse 79   Ht 5\' 9"  (1.753 m)   Wt 200 lb (90.7 kg)   BMI 29.53 kg/m   Physical Exam Constitutional:      Appearance: He is well-developed.  HENT:     Head: Normocephalic and atraumatic.  Eyes:     Pupils: Pupils are equal, round, and reactive to light.  Pulmonary:     Effort: Pulmonary effort is normal.     Breath sounds: Normal breath sounds.  Abdominal:     General: Bowel sounds are normal.     Palpations: Abdomen is soft.  Musculoskeletal:        General: Normal range of motion.     Cervical back: Normal range of motion and neck supple.     Lumbar back: Negative left straight leg raise test.  Skin:    General: Skin is warm and dry.  Neurological:     Mental Status: He is alert and oriented to person, place, and time.  Psychiatric:        Behavior: Behavior normal.        Thought Content: Thought content normal.        Judgment: Judgment normal.     Back Exam   Range of Motion  Extension: normal  Flexion: normal  Lateral bend right: normal  Lateral bend left: normal  Rotation right: normal  Rotation left: normal   Muscle Strength  Right Quadriceps:  5/5  Left Quadriceps:  5/5  Right Hamstrings:  5/5  Left Hamstrings:  5/5   Tests  Straight leg raise left: negative  Other  Toe walk: normal Heel walk: normal Sensation: normal Gait: abnormal    Right Shoulder Exam   Tenderness  The patient is experiencing tenderness in the acromion and acromioclavicular joint.  Muscle Strength  Abduction: 4/5  Internal rotation: 4/5  External rotation: 4/5  Supraspinatus: 3/5  Biceps: 5/5   Tests  Apprehension: positive Impingement: positive Drop arm: positive  Other  Erythema: absent Scars: absent Sensation: normal Pulse: present  Comments:  Right drop arm sign consistent with right rotator cuff tear.  Xrays done at his primary care MD office and he  reports this was normal.       Specialty Comments:  No specialty comments available.  Imaging: No results found.   PMFS History: Patient Active Problem List   Diagnosis Date Noted  . Lumbar radiculopathy 09/28/2016   Past Medical History:  Diagnosis Date  . Hypertension     History reviewed. No pertinent family history.  Past Surgical History:  Procedure Laterality Date  . ANTERIOR CERVICAL DECOMP/DISCECTOMY FUSION N/A 10/05/2012   Procedure: ANTERIOR CERVICAL DECOMPRESSION/DISCECTOMY FUSION 1 LEVEL;  Surgeon: 12/05/2012, MD;  Location: [facility] OR;  Service: Orthopedics;  Laterality: N/A;  TOTAL DISC REPLACEMENT VERSES ACDF C4-5  . KNEE ARTHROSCOPY     left  .  KNEE ARTHROSCOPY     on left side again  . ROTATOR CUFF REPAIR     left  . ROTATOR CUFF REPAIR     2nd time around (after fall)   Social History   Occupational History  . Not on file  Tobacco Use  . Smoking status: Never Smoker  . Smokeless tobacco: Never Used  Substance and Sexual Activity  . Alcohol use: No  . Drug use: No  . Sexual activity: Not on file

## 2021-01-03 NOTE — Patient Instructions (Signed)
Avoid overhead lifting and overhead use of the arms. Do not lift greater than 10 lbs. Tylenol ES one every 6-8 hours for pain and inflamation. MRI of the right shoulder ordered due to weakness not improved with blocking cuff and  Persistent weakness 10 weeks post fall. EMG/NCV right arm ordered due to numbness and paresthesias and weakness consistent with radiculopathy vs brachial plexopathy.  Avoid bending, stooping and avoid lifting weights greater than 10 lbs. Avoid prolong standing and walking. Avoid frequent bending and stooping  No lifting greater than 10 lbs. May use ice or moist heat for pain. Weight loss is of benefit. Handicap license is approved.

## 2021-01-06 ENCOUNTER — Telehealth: Payer: Self-pay | Admitting: Physical Medicine and Rehabilitation

## 2021-01-06 NOTE — Telephone Encounter (Signed)
Called pt and sch 7/29

## 2021-01-06 NOTE — Telephone Encounter (Signed)
Patient returned call asked for a call back    [phone]

## 2021-01-11 ENCOUNTER — Other Ambulatory Visit: Payer: Self-pay

## 2021-01-11 ENCOUNTER — Ambulatory Visit (INDEPENDENT_AMBULATORY_CARE_PROVIDER_SITE_OTHER): Payer: Medicare HMO

## 2021-01-11 DIAGNOSIS — R29898 Other symptoms and signs involving the musculoskeletal system: Secondary | ICD-10-CM | POA: Diagnosis not present

## 2021-01-11 DIAGNOSIS — M25511 Pain in right shoulder: Secondary | ICD-10-CM

## 2021-01-11 DIAGNOSIS — W19XXXA Unspecified fall, initial encounter: Secondary | ICD-10-CM

## 2021-01-21 ENCOUNTER — Telehealth: Payer: Self-pay

## 2021-01-21 NOTE — Telephone Encounter (Signed)
Patient called he is requesting a referral to be sent to Dr.Dean call back:[phone]

## 2021-01-30 ENCOUNTER — Ambulatory Visit: Payer: Medicare HMO | Admitting: Orthopedic Surgery

## 2021-01-30 ENCOUNTER — Encounter: Payer: Self-pay | Admitting: Orthopedic Surgery

## 2021-01-30 VITALS — Ht 69.0 in | Wt 200.0 lb

## 2021-01-30 DIAGNOSIS — M25511 Pain in right shoulder: Secondary | ICD-10-CM

## 2021-01-30 DIAGNOSIS — M75121 Complete rotator cuff tear or rupture of right shoulder, not specified as traumatic: Secondary | ICD-10-CM

## 2021-01-31 ENCOUNTER — Telehealth: Payer: Self-pay | Admitting: Specialist

## 2021-01-31 NOTE — Telephone Encounter (Signed)
Pt called and wants to know does he need to come [place] on the 6th? He states he was referred to [facility] for his shoulder.

## 2021-01-31 NOTE — Telephone Encounter (Signed)
Please advise. Patient has seen Dr. August Saucer for shoulder. You also entered referral to Dr. Alvester Morin for EMG/NCS RUE and that appointment is not scheduled until 02/28/2021.  Would you like for me to cancel appointment on 02/05/2021?

## 2021-02-01 ENCOUNTER — Encounter: Payer: Self-pay | Admitting: Orthopedic Surgery

## 2021-02-01 NOTE — Progress Notes (Signed)
Office Visit Note   Patient: Erik Cook           Date of Birth: 02/24/1961           MRN: [id number] Visit Date: 01/30/2021 Requested by: No referring provider defined for this encounter. PCP: Patient, No Pcp Per (Inactive)  Subjective: Chief Complaint  Patient presents with   Right Shoulder - Pain    HPI: Erik Cook is a 60 year old patient who reports fall on outstretched arm 3 months ago.  He is having pain in the joint.  Not taking any pain medicine.  He has had an MRI scan.  Old notes are reviewed.  Cortisone injection 01/03/2021 helped minimally.  MRI scan also reviewed with the patient which shows full-thickness full width tear of the supraspinatus tendon with 3 cm of retraction as well as some high-grade tearing of the subscap tendon and no muscle atrophy.              ROS: All systems reviewed are negative as they relate to the chief complaint within the history of present illness.  Patient denies  fevers or chills.   Assessment & Plan: Visit Diagnoses:  1. Acute pain of right shoulder   2. Complete tear of right rotator cuff, unspecified whether traumatic     Plan: Impression is right shoulder rotator cuff tear with no atrophy yet but a fair amount of retraction.  He is 60 years old and does active things.  I think he would do well with rotator cuff repair and biceps tenodesis.  We would also have to look at the upper border of the subscap at the time of surgery with possible fixation placed there as well.  Unfortunately the patient is having some major financial issues at this time.  Cannot undergo surgery at this time.  I have asked Eunice Blase to talk to him about resources from clinic could help him.  When he gets this sorted out I think surgery within the next 3 to 4 months would be indicated to allow for tendon repair and healing.  The risk and benefits are discussed with the patient including not limited to infection nerve vessel damage shoulder stiffness as well as the extensive  rehabilitative process required.  He is in the process of moving and cannot really do surgery at this time.  Would like for him and asked him to call back once he gets all these issues sorted out.  Follow-Up Instructions: Return if symptoms worsen or fail to improve.   Orders:  No orders of the defined types were placed in this encounter.  No orders of the defined types were placed in this encounter.     Procedures: No procedures performed   Clinical Data: No additional findings.  Objective: Vital Signs: Ht 5\' 9"  (1.753 m)   Wt 200 lb (90.7 kg)   BMI 29.53 kg/m   Physical Exam:   Constitutional: Patient appears well-developed HEENT:  Head: Normocephalic Eyes:EOM are normal Neck: Normal range of motion Cardiovascular: Normal rate Pulmonary/chest: Effort normal Neurologic: Patient is alert Skin: Skin is warm Psychiatric: Patient has normal mood and affect   Ortho Exam: Ortho exam demonstrates full active and passive range of motion of both arms.  Passive range of motion bilaterally is approximately 55/100/170.  Has a little bit of coarse grinding and crepitus with passive range of motion on the right at 90 degrees of abduction.  Surprisingly not a lot of weakness with internal and external rotation as well as subscap  testing.  No discrete AC joint tenderness right versus left  Specialty Comments:  No specialty comments available.  Imaging: No results found.   PMFS History: Patient Active Problem List   Diagnosis Date Noted   Lumbar radiculopathy 09/28/2016   Past Medical History:  Diagnosis Date   Hypertension     History reviewed. No pertinent family history.  Past Surgical History:  Procedure Laterality Date   ANTERIOR CERVICAL DECOMP/DISCECTOMY FUSION N/A 10/05/2012   Procedure: ANTERIOR CERVICAL DECOMPRESSION/DISCECTOMY FUSION 1 LEVEL;  Surgeon: Venita Lick, MD;  Location: [facility] OR;  Service: Orthopedics;  Laterality: N/A;  TOTAL DISC REPLACEMENT VERSES ACDF  C4-5   KNEE ARTHROSCOPY     left   KNEE ARTHROSCOPY     on left side again   ROTATOR CUFF REPAIR     left   ROTATOR CUFF REPAIR     2nd time around (after fall)   Social History   Occupational History   Not on file  Tobacco Use   Smoking status: Never   Smokeless tobacco: Never  Substance and Sexual Activity   Alcohol use: No   Drug use: No   Sexual activity: Not on file

## 2021-02-05 ENCOUNTER — Other Ambulatory Visit: Payer: Self-pay

## 2021-02-05 ENCOUNTER — Ambulatory Visit: Payer: Medicare HMO | Admitting: Specialist

## 2021-02-28 ENCOUNTER — Ambulatory Visit (INDEPENDENT_AMBULATORY_CARE_PROVIDER_SITE_OTHER): Payer: Medicare HMO | Admitting: Physical Medicine and Rehabilitation

## 2021-02-28 ENCOUNTER — Other Ambulatory Visit: Payer: Self-pay

## 2021-02-28 ENCOUNTER — Encounter: Payer: Self-pay | Admitting: Physical Medicine and Rehabilitation

## 2021-02-28 DIAGNOSIS — R202 Paresthesia of skin: Secondary | ICD-10-CM | POA: Diagnosis not present

## 2021-02-28 NOTE — Progress Notes (Signed)
Pain radiating down right arm and diminished strength in right hand. Torn rotator cuff with surgery scheduled.  Right hand dominant No lotion per patient Numeric Pain Rating Scale and Functional Assessment Average Pain 7   In the last MONTH (on 0-10 scale) has pain interfered with the following?  1. General activity like being  able to carry out your everyday physical activities such as walking, climbing stairs, carrying groceries, or moving a chair?  Rating(5)

## 2021-03-04 NOTE — Progress Notes (Signed)
Surgical Instructions    Your procedure is scheduled on Thursday, August 11th, 2022.   Report to [facility] Main Entrance "A" at 10:40 A.M., then check in with the Admitting office.  Call this number if you have problems the morning of surgery:  [phone]   If you have any questions prior to your surgery date call [phone]: Open Monday-Friday 8am-4pm    Remember:  Do not eat or drink after midnight the night before your surgery    Take these medicines the morning of surgery with A SIP OF WATER:  pregabalin (LYRICA) baclofen (LIORESAL) - if needed acetaminophen (TYLENOL) - if needed   As of today, STOP taking any Aspirin (unless otherwise instructed by your surgeon) Aleve, Naproxen, Ibuprofen, meloxicam (MOBIC), Motrin, Advil, Goody's, BC's, all herbal medications, fish oil, and all vitamins.          Do not wear jewelry  Do not wear lotions, powders, colognes, or deodorant. Men may shave face and neck. Do not bring valuables to the hospital. DO Not wear nail polish, gel polish, artificial nails, or any other type of covering on natural nails including finger and toenails. If patients have artificial nails, gel coating, etc. that need to be removed by a nail salon please have this removed prior to surgery or surgery may need to be canceled/delayed if the surgeon/ anesthesia feels like the patient is unable to be adequately monitored.             [facility] is not responsible for any belongings or valuables.  Do NOT Smoke (Tobacco/Vaping) or drink Alcohol 24 hours prior to your procedure If you use a CPAP at night, you may bring all equipment for your overnight stay.   Contacts, glasses, dentures or bridgework may not be worn into surgery, please bring cases for these belongings   For patients admitted to the hospital, discharge time will be determined by your treatment team.   Patients discharged the day of surgery will not be allowed to drive home, and someone needs to  stay with them for 24 hours.  ONLY 1 SUPPORT PERSON MAY BE PRESENT WHILE YOU ARE IN SURGERY. IF YOU ARE TO BE ADMITTED ONCE YOU ARE IN YOUR ROOM YOU WILL BE ALLOWED TWO (2) VISITORS.  Minor children may have two parents present. Special consideration for safety and communication needs will be reviewed on a case by case basis.  Special instructions:    Oral Hygiene is also important to reduce your risk of infection.  Remember - BRUSH YOUR TEETH THE MORNING OF SURGERY WITH YOUR REGULAR TOOTHPASTE   [place]- Preparing For Surgery  Before surgery, you can play an important role. Because skin is not sterile, your skin needs to be as free of germs as possible. You can reduce the number of germs on your skin by washing with CHG (chlorahexidine gluconate) Soap before surgery.  CHG is an antiseptic cleaner which kills germs and bonds with the skin to continue killing germs even after washing.     Please do not use if you have an allergy to CHG or antibacterial soaps. If your skin becomes reddened/irritated stop using the CHG.  Do not shave (including legs and underarms) for at least 48 hours prior to first CHG shower. It is OK to shave your face.  Please follow these instructions carefully.     Shower the NIGHT BEFORE SURGERY and the MORNING OF SURGERY with CHG Soap.   If you chose to wash your hair,  wash your hair first as usual with your normal shampoo. After you shampoo, rinse your hair and body thoroughly to remove the shampoo.  Then [employer] and genitals (private parts) with your normal soap and rinse thoroughly to remove soap.  After that Use CHG Soap as you would any other liquid soap. You can apply CHG directly to the skin and wash gently with a scrungie or a clean washcloth.   Apply the CHG Soap to your body ONLY FROM THE NECK DOWN.  Do not use on open wounds or open sores. Avoid contact with your eyes, ears, mouth and genitals (private parts). Wash Face and genitals (private parts)   with your normal soap.   Wash thoroughly, paying special attention to the area where your surgery will be performed.  Thoroughly rinse your body with warm water from the neck down.  DO NOT shower/wash with your normal soap after using and rinsing off the CHG Soap.  Pat yourself dry with a CLEAN TOWEL.  8. Apply the Benzoyl Peroxide only the night before surgery.  Do Not use it the morning of surgery.  Wear CLEAN PAJAMAS to bed the night before surgery  Place CLEAN SHEETS on your bed the night before your surgery  DO NOT SLEEP WITH PETS.   Day of Surgery: Take a shower with CHG soap. Wear Clean/Comfortable clothing the morning of surgery Do not apply any deodorants/lotions.   Remember to brush your teeth WITH YOUR REGULAR TOOTHPASTE.   Please read over the following fact sheets that you were given.         [place]- Preparing For Surgery  Before surgery, you can play an important role. Because skin is not sterile, your skin needs to be as free of germs as possible. You can reduce the number of germs on your skin by washing with CHG (chlorahexidine gluconate) Soap before surgery.  CHG is an antiseptic cleaner which kills germs and bonds with the skin to continue killing germs even after washing.     Please do not use if you have an allergy to CHG or antibacterial soaps. If your skin becomes reddened/irritated stop using the CHG.  Do not shave (including legs and underarms) for at least 48 hours prior to first CHG shower. It is OK to shave your face.  Please follow these instructions carefully.     Shower the NIGHT BEFORE SURGERY and the MORNING OF SURGERY with CHG Soap.   If you chose to wash your hair, wash your hair first as usual with your normal shampoo. After you shampoo, rinse your hair and body thoroughly to remove the shampoo.  Then [employer] and genitals (private parts) with your normal soap and rinse thoroughly to remove soap.  After that Use CHG Soap as you  would any other liquid soap. You can apply CHG directly to the skin and wash gently with a scrungie or a clean washcloth.   Apply the CHG Soap to your body ONLY FROM THE NECK DOWN.  Do not use on open wounds or open sores. Avoid contact with your eyes, ears, mouth and genitals (private parts). Wash Face and genitals (private parts)  with your normal soap.   Wash thoroughly, paying special attention to the area where your surgery will be performed.  Thoroughly rinse your body with warm water from the neck down.  DO NOT shower/wash with your normal soap after using and rinsing off the CHG Soap.  Pat yourself dry with a CLEAN TOWEL.  Wear CLEAN PAJAMAS to bed the night before surgery  Place CLEAN SHEETS on your bed the night before your surgery  DO NOT SLEEP WITH PETS.   Day of Surgery:  Take a shower with CHG soap. Wear Clean/Comfortable clothing the morning of surgery Do not apply any deodorants/lotions.   Remember to brush your teeth WITH YOUR REGULAR TOOTHPASTE.   Please read over the following fact sheets that you were given.

## 2021-03-04 NOTE — Procedures (Signed)
EMG & NCV Findings: Evaluation of the right median motor nerve showed prolonged distal onset latency (4.3 ms) and decreased conduction velocity (Elbow-Wrist, 46 m/s).  The right median (across palm) sensory nerve showed prolonged distal peak latency (Wrist, 4.2 ms).  All remaining nerves (as indicated in the following tables) were within normal limits.    All examined muscles (as indicated in the following table) showed no evidence of electrical instability.    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 There is no significant electrodiagnostic evidence of any other focal nerve entrapment, brachial plexopathy or cervical radiculopathy  Recommendations: 1.  Follow-up with referring physician. 2.  Continue current management of symptoms. 3.  Continue use of resting splint at night-time and as needed during the day. 4.  Suggest surgical evaluation.  ___________________________ Erik Cook FAAPMR Board Certified, American Board of Physical Medicine and Rehabilitation    Nerve Conduction Studies Anti Sensory Summary Table   Stim Site NR Peak (ms) Norm Peak (ms) P-T Amp (V) Norm P-T Amp Site1 Site2 Delta-P (ms) Dist (cm) Vel (m/s) Norm Vel (m/s)  Right Median Acr Palm Anti Sensory (2nd Digit)  30.6C  Wrist    *4.2 <3.6 16.1 >10 Wrist Palm 2.2 0.0    Palm    2.0 <2.0 16.2         Right Radial Anti Sensory (Base 1st Digit)  30.5C  Wrist    2.3 <3.1 18.8  Wrist Base 1st Digit 2.3 0.0    Right Ulnar Anti Sensory (5th Digit)  30.9C  Wrist    3.1 <3.7 20.5 >15.0 Wrist 5th Digit 3.1 14.0 45 >38   Motor Summary Table   Stim Site NR Onset (ms) Norm Onset (ms) O-P Amp (mV) Norm O-P Amp Site1 Site2 Delta-0 (ms) Dist (cm) Vel (m/s) Norm Vel (m/s)  Right Median Motor (Abd Poll Brev)  30.8C  Wrist    *4.3 <4.2 6.9 >5 Elbow Wrist 4.8 22.0  *46 >50  Elbow    9.1  3.8         Right Ulnar Motor (Abd Dig Min)  31.1C  Wrist    3.1 <4.2 10.8 >3 B Elbow Wrist 3.4 21.0 62 >53  B Elbow    6.5  10.7  A Elbow B Elbow 1.5 11.0 73 >53  A Elbow    8.0  11.0          EMG   Side Muscle Nerve Root Ins Act Fibs Psw Amp Dur Poly Recrt Int Dennie Bible Comment  Right 1stDorInt Ulnar C8-T1 Nml Nml Nml Nml Nml 0 Nml Nml   Right Abd Poll Brev Median C8-T1 Nml Nml Nml Nml Nml 0 Nml Nml   Right ExtDigCom   Nml Nml Nml Nml Nml 0 Nml Nml   Right Triceps Radial C6-7-8 Nml Nml Nml Nml Nml 0 Nml Nml   Right Deltoid Axillary C5-6 Nml Nml Nml Nml Nml 0 Nml Nml     Nerve Conduction Studies Anti Sensory Left/Right Comparison   Stim Site L Lat (ms) R Lat (ms) L-R Lat (ms) L Amp (V) R Amp (V) L-R Amp (%) Site1 Site2 L Vel (m/s) R Vel (m/s) L-R Vel (m/s)  Median Acr Palm Anti Sensory (2nd Digit)  30.6C  Wrist  *4.2   16.1  Wrist Palm     Palm  2.0   16.2  Radial Anti Sensory (Base 1st Digit)  30.5C  Wrist  2.3   18.8  Wrist Base 1st Digit     Ulnar Anti Sensory (5th Digit)  30.9C  Wrist  3.1   20.5  Wrist 5th Digit  45    Motor Left/Right Comparison   Stim Site L Lat (ms) R Lat (ms) L-R Lat (ms) L Amp (mV) R Amp (mV) L-R Amp (%) Site1 Site2 L Vel (m/s) R Vel (m/s) L-R Vel (m/s)  Median Motor (Abd Poll Brev)  30.8C  Wrist  *4.3   6.9  Elbow Wrist  *46   Elbow  9.1   3.8        Ulnar Motor (Abd Dig Min)  31.1C  Wrist  3.1   10.8  B Elbow Wrist  62   B Elbow  6.5   10.7  A Elbow B Elbow  73   A Elbow  8.0   11.0           Waveforms:

## 2021-03-04 NOTE — Progress Notes (Signed)
Erik Cook - 60 y.o. male MRN [id number]  Date of birth: 12/12/1960  Office Visit Note: Visit Date: 02/28/2021 PCP: Veverly FellsSanders, Kirk S, MD Referred by: Kerrin ChampagneNitka, James E, MD  Subjective: Chief Complaint  Patient presents with   Right Arm - Pain   HPI:  Erik Cook is a 60 y.o. male who comes in today at the request of Dr. Vira BrownsJames Nitka for electrodiagnostic study of the Right upper extremities.  Patient is Right hand dominant.  Patient complains of average pain of 7 out of 10 with pain radiating down the right arm with diminished strength in the right hand with nondermatomal paresthesia but mainly more radial digits.  No symptoms on the left.  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 Venita Lickahari Brooks.  He has had cervical epidural injections with temporary relief.  He has not had recent electrodiagnostic studies.  Performed a 2018 electrodiagnostic study of both lower limbs which was normal. Upcoming shoulder surgery with Dr. Burnard BuntingG. Scott Dean.   ROS Otherwise per HPI.  Assessment & Plan: Visit Diagnoses:    ICD-10-CM   1. 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 There is no significant electrodiagnostic evidence of any other focal nerve entrapment, brachial plexopathy or cervical radiculopathy  Recommendations: 1.  Follow-up with referring physician. 2.  Continue current management of symptoms. 3.  Continue use of resting splint at night-time and as needed during the day. 4.  Suggest surgical evaluation.  Meds & Orders: No orders of the defined types were placed in this encounter.   Orders Placed This Encounter  Procedures   NCV with EMG (electromyography)    Follow-up: Return in about 2 weeks (around 03/14/2021) for Vira BrownsJames  Nitka, MD.   Procedures: No procedures performed  EMG & NCV Findings: Evaluation of the right median motor nerve showed prolonged distal onset latency (4.3 ms) and decreased conduction velocity (Elbow-Wrist, 46 m/s).  The right median (across palm) sensory nerve showed prolonged distal peak latency (Wrist, 4.2 ms).  All remaining nerves (as indicated in the following tables) were within normal limits.    All examined muscles (as indicated in the following table) showed no evidence of electrical instability.    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 There is no significant electrodiagnostic evidence of any other focal nerve entrapment, brachial plexopathy or cervical radiculopathy  Recommendations: 1.  Follow-up with referring physician. 2.  Continue current management of symptoms. 3.  Continue use of resting splint at night-time and as needed during the day. 4.  Suggest surgical evaluation.  ___________________________ Naaman PlummerFred Darelle Kings FAAPMR Board Certified, American Board of Physical Medicine and Rehabilitation    Nerve Conduction Studies Anti Sensory Summary Table   Stim Site NR Peak (ms) Norm Peak (ms) P-T Amp (V) Norm P-T Amp Site1 Site2 Delta-P (ms) Dist (cm) Vel (m/s) Norm Vel (m/s)  Right Median Acr Palm Anti Sensory (2nd Digit)  30.6C  Wrist    *4.2 <3.6 16.1 >10 Wrist Palm 2.2 0.0    Palm    2.0 <2.0 16.2         Right Radial Anti Sensory (Base 1st Digit)  30.5C  Wrist    2.3 <3.1 18.8  Wrist Base 1st Digit 2.3 0.0    Right Ulnar Anti Sensory (5th Digit)  30.9C  Wrist    3.1 <3.7 20.5 >15.0 Wrist 5th Digit 3.1 14.0 45 >38   Motor Summary Table   Stim Site NR Onset (ms) Norm Onset (ms) O-P Amp (mV) Norm O-P Amp Site1 Site2 Delta-0 (ms) Dist (cm) Vel (m/s) Norm Vel (m/s)  Right Median Motor (Abd Poll Brev)   30.8C  Wrist    *4.3 <4.2 6.9 >5 Elbow Wrist 4.8 22.0 *46 >50  Elbow    9.1  3.8         Right Ulnar Motor (Abd Dig Min)  31.1C  Wrist    3.1 <4.2 10.8 >3 B Elbow Wrist 3.4 21.0 62 >53  B Elbow    6.5  10.7  A Elbow B Elbow 1.5 11.0 73 >53  A Elbow    8.0  11.0          EMG   Side Muscle Nerve Root Ins Act Fibs Psw Amp Dur Poly Recrt Int Dennie Bible Comment  Right 1stDorInt Ulnar C8-T1 Nml Nml Nml Nml Nml 0 Nml Nml   Right Abd Poll Brev Median C8-T1 Nml Nml Nml Nml Nml 0 Nml Nml   Right ExtDigCom   Nml Nml Nml Nml Nml 0 Nml Nml   Right Triceps Radial C6-7-8 Nml Nml Nml Nml Nml 0 Nml Nml   Right Deltoid Axillary C5-6 Nml Nml Nml Nml Nml 0 Nml Nml     Nerve Conduction Studies Anti Sensory Left/Right Comparison   Stim Site L Lat (ms) R Lat (ms) L-R Lat (ms) L Amp (V) R Amp (V) L-R Amp (%) Site1 Site2 L Vel (m/s) R Vel (m/s) L-R Vel (m/s)  Median Acr Palm Anti Sensory (2nd Digit)  30.6C  Wrist  *4.2   16.1  Wrist Palm     Palm  2.0   16.2        Radial Anti Sensory (Base 1st Digit)  30.5C  Wrist  2.3   18.8  Wrist Base 1st Digit     Ulnar Anti Sensory (5th Digit)  30.9C  Wrist  3.1   20.5  Wrist 5th Digit  45    Motor Left/Right Comparison   Stim Site L Lat (ms) R Lat (ms) L-R Lat (ms) L Amp (mV) R Amp (mV) L-R Amp (%) Site1 Site2 L Vel (m/s) R Vel (m/s) L-R Vel (m/s)  Median Motor (Abd Poll Brev)  30.8C  Wrist  *4.3   6.9  Elbow Wrist  *46   Elbow  9.1   3.8        Ulnar Motor (Abd Dig Min)  31.1C  Wrist  3.1   10.8  B Elbow Wrist  62   B Elbow  6.5   10.7  A Elbow B Elbow  73   A Elbow  8.0   11.0           Waveforms:             Clinical History: 2018 emg/ncv  Impression: Essentially NORMAL electrodiagnostic study of both lower limbs.  There is no significant electrodiagnostic evidence of nerve entrapment, lumbosacral plexopathy, lumbar radiculopathy or generalized peripheral neuropathy.     As you know, purely sensory or demyelinating radiculopathies and  chemical radiculitis may not be detected with this particular electrodiagnostic study.   Recommendations: 1.  Follow-up with referring physician. 2.  Continue current management of symptoms. -----    MRI CERVICAL SPINE WITHOUT CONTRAST   TECHNIQUE: Multiplanar, multisequence MR imaging of the cervical spine was performed. No intravenous contrast was administered.   COMPARISON: 01/25/2019   FINDINGS: Alignment: Normal   Vertebrae: C4-5 ACDF. No acute abnormality. Mild facet edema on the left at C3-4.   Cord: Normal   Posterior Fossa, vertebral arteries, paraspinal tissues: Normal   Disc levels:   C1-2: Unremarkable.   C2-3: Normal disc space and facet joints. There is no spinal canal stenosis. No neural foraminal stenosis.   C3-4: Mild left facet edema is new since the prior study. There is mild bilateral uncovertebral hypertrophy. There is no spinal canal stenosis. Mild bilateral neural foraminal stenosis.   C4-5: ACDF. There is no spinal canal stenosis. No neural foraminal stenosis.   C5-6: Small disc bulge. Unchanged right uncovertebral osteophyte. There is no spinal canal stenosis. Moderate right neural foraminal stenosis.   C6-7: Left asymmetric disc bulge with left-greater-than-right uncovertebral hypertrophy. There is no spinal canal stenosis. Mild right and moderate left, unchanged neural foraminal stenosis.   C7-T1: Normal disc space and facet joints. There is no spinal canal stenosis. No neural foraminal stenosis.   IMPRESSION: 1. Mild left C3-4 facet edema is new since the prior study and could be a source of local neck pain. 2. Moderate right C5-6 neural foraminal stenosis, unchanged. 3. Mild right and moderate left C6-7 neural foraminal stenosis, unchanged.     Electronically Signed By: Deatra Robinson M.D. On: 03/18/2020 19:31 --------------------------  MRI LUMBAR SPINE WITHOUT CONTRAST     TECHNIQUE:  Multiplanar, multisequence MR imaging  of the lumbar spine was  performed. No intravenous contrast was administered.     COMPARISON: Radiography 12/30/2017     FINDINGS:  Segmentation: 5 lumbar type vertebral bodies     Alignment: Mild anterolisthesis at L5-S1     Vertebrae: No fracture, evidence of discitis, or aggressive bone  lesion. L2 hemangioma     Conus medullaris and cauda equina: Conus extends to the L1 level.  Conus and cauda equina appear normal.     Paraspinal and other soft tissues: Negative     Disc levels:     T12- L1: Unremarkable.     L1-L2: Mild facet hypertrophy. No impingement     L2-L3: Disc narrowing and bulging. Mild facet hypertrophy.  Noncompressive spinal canal and foraminal narrowing.     L3-L4: Disc narrowing and bulging. Mild facet spurring. Right  foraminal impingement. Mild spinal stenosis.     L4-L5: Disc narrowing and bulging with endplate spurring. Negative  facets. Noncompressive bilateral foraminal narrowing. Mild spinal  stenosis     L5-S1:Mild degenerative facet spurring. Mild disc bulging. There is  left subarticular recess narrowing without convincing S1  impingement.     IMPRESSION:  1. Disc narrowing and bulging at L2-3 to L4-5 with mild spinal  stenosis.  2. L3-4 moderate to advanced right foraminal stenosis. There is  noncompressive foraminal narrowing the other levels of disc  degeneration.        Electronically Signed  By: Marnee Spring M.D.  On: 04/17/2018 10:18     Objective:  VS:  HT:    WT:   BMI:     BP:   HR: bpm  TEMP: ( )  RESP:  Physical Exam Musculoskeletal:        General: No tenderness.     Comments: Inspection reveals no atrophy of the bilateral APB or FDI or hand intrinsics.  There is no swelling, color changes, allodynia or dystrophic changes. There is 5 out of 5 strength in the bilateral wrist extension, finger abduction and long finger flexion. There is intact sensation to light touch in all dermatomal and peripheral nerve  distributions. . There is a positive Phalen's test on the right. There is a negative Hoffmann's test bilaterally.  Skin:    General: Skin is warm and dry.     Findings: No erythema or rash.  Neurological:     General: No focal deficit present.     Mental Status: He is alert and oriented to person, place, and time.     Sensory: No sensory deficit.     Motor: No weakness or abnormal muscle tone.     Coordination: Coordination normal.     Gait: Gait normal.  Psychiatric:        Mood and Affect: Mood normal.        Behavior: Behavior normal.        Thought Content: Thought content normal.     Imaging: No results found.

## 2021-03-05 ENCOUNTER — Encounter (HOSPITAL_COMMUNITY)
Admission: RE | Admit: 2021-03-05 | Discharge: 2021-03-05 | Disposition: A | Discharge status: Home / Self Care | Payer: Medicare HMO | Source: Ambulatory Visit | Attending: Orthopedic Surgery | Admitting: Orthopedic Surgery

## 2021-03-05 ENCOUNTER — Encounter (HOSPITAL_COMMUNITY): Payer: Self-pay | Admitting: Orthopedic Surgery

## 2021-03-05 ENCOUNTER — Encounter (HOSPITAL_COMMUNITY): Payer: Self-pay

## 2021-03-05 ENCOUNTER — Other Ambulatory Visit: Payer: Self-pay

## 2021-03-05 DIAGNOSIS — Z01818 Encounter for other preprocedural examination: Secondary | ICD-10-CM | POA: Insufficient documentation

## 2021-03-05 HISTORY — DX: Gastro-esophageal reflux disease without esophagitis: K21.9

## 2021-03-05 HISTORY — DX: Unspecified osteoarthritis, unspecified site: M19.90

## 2021-03-05 HISTORY — DX: Sleep apnea, unspecified: G47.30

## 2021-03-05 HISTORY — DX: Hyperlipidemia, unspecified: E78.5

## 2021-03-05 LAB — BASIC METABOLIC PANEL
Anion gap: 8 (ref 5–15)
BUN: 18 mg/dL (ref 6–20)
CO2: 27 mmol/L (ref 22–32)
Calcium: 9.1 mg/dL (ref 8.9–10.3)
Chloride: 102 mmol/L (ref 98–111)
Creatinine, Ser: 0.89 mg/dL (ref 0.61–1.24)
GFR, Estimated: >60 mL/min (ref >60)
Glucose, Bld: 99 mg/dL (ref 70–99)
Potassium: 4.1 mmol/L (ref 3.5–5.1)
Sodium: 137 mmol/L (ref 135–145)

## 2021-03-05 LAB — CBC
HCT: 46.9 % (ref 39.0–52.0)
Hemoglobin: 16.2 g/dL (ref 13.0–17.0)
MCH: 30.5 pg (ref 26.0–34.0)
MCHC: 34.5 g/dL (ref 30.0–36.0)
MCV: 88.3 fL (ref 80.0–100.0)
Platelets: 174 10*3/uL (ref 150–400)
RBC: 5.31 MIL/uL (ref 4.22–5.81)
RDW: 13.1 % (ref 11.5–15.5)
WBC: 7.2 10*3/uL (ref 4.0–10.5)
nRBC: 0 % (ref 0.0–0.2)

## 2021-03-05 NOTE — Progress Notes (Signed)
DUE TO COVID-19 ONLY ONE VISITOR IS ALLOWED TO COME WITH YOU AND STAY IN THE WAITING ROOM ONLY DURING PRE OP AND PROCEDURE DAY OF SURGERY.   PCP - Dr Lindajo Royal Cardiologist - n/a  Chest x-ray - n/a EKG - 03/05/21 Stress Test - n/a ECHO - n/a Cardiac Cath - n/a  Sleep Study -  Yes CPAP - does not use cpap  STOP now taking any Aspirin (unless otherwise instructed by your surgeon), Aleve, Naproxen, Ibuprofen, Motrin, Advil, Goody's, BC's, all herbal medications, fish oil, and all vitamins.   Coronavirus Screening Covid test n/a - Ambulatory Surgery  Do you have any of the following symptoms:  Cough yes/no: No Fever (>100.62F)  yes/no: No Runny nose yes/no: No Sore throat yes/no: No Difficulty breathing/shortness of breath  yes/no: No  Have you traveled in the last 14 days and where? yes/no: No  Patient verbalized understanding of instructions that were given to them at the PAT appointment.

## 2021-03-05 NOTE — Progress Notes (Signed)
Surgical Instructions    Your procedure is scheduled on Thursday, August 11th, 2022.   Report to [facility] Main Entrance "A" at 10:40 A.M., then check in with the Admitting office.  Call this number if you have problems the morning of surgery:  [phone]   If you have any questions prior to your surgery date call [phone]: Open Monday-Friday 8am-4pm    Remember:  Do not eat or drink after midnight the night before your surgery    Take these medicines the morning of surgery with A SIP OF WATER:  pregabalin (LYRICA) baclofen (LIORESAL) - if needed acetaminophen (TYLENOL) - if needed   As of today, STOP taking any Aspirin (unless otherwise instructed by your surgeon) Aleve, Naproxen, Ibuprofen, meloxicam (MOBIC), Motrin, Advil, Goody's, BC's, all herbal medications, fish oil, and all vitamins.          Do not wear jewelry  Do not wear lotions, powders, colognes, or deodorant. Men may shave face and neck. Do not bring valuables to the hospital.             [facility] is not responsible for any belongings or valuables.  Do NOT Smoke (Tobacco/Vaping) or drink Alcohol 24 hours prior to your procedure If you use a CPAP at night, you may bring all equipment for your overnight stay.   Contacts, glasses, dentures or bridgework may not be worn into surgery, please bring cases for these belongings    Patients discharged the day of surgery will not be allowed to drive home, and someone needs to stay with them for 24 hours.  ONLY 1 SUPPORT PERSON MAY BE PRESENT WHILE YOU ARE IN SURGERY. IF YOU ARE TO BE ADMITTED ONCE YOU ARE IN YOUR ROOM YOU WILL BE ALLOWED TWO (2) VISITORS.  Minor children may have two parents present. Special consideration for safety and communication needs will be reviewed on a case by case basis.  Special instructions:    Oral Hygiene is also important to reduce your risk of infection.  Remember - BRUSH YOUR TEETH THE MORNING OF SURGERY WITH YOUR REGULAR  TOOTHPASTE   Meta- Preparing For Surgery  Before surgery, you can play an important role. Because skin is not sterile, your skin needs to be as free of germs as possible. You can reduce the number of germs on your skin by washing with CHG (chlorahexidine gluconate) Soap before surgery.  CHG is an antiseptic cleaner which kills germs and bonds with the skin to continue killing germs even after washing.     Please do not use if you have an allergy to CHG or antibacterial soaps. If your skin becomes reddened/irritated stop using the CHG.  Do not shave (including legs and underarms) for at least 48 hours prior to first CHG shower. It is OK to shave your face.  Please follow these instructions carefully.     Shower the NIGHT BEFORE SURGERY Wed and the MORNING OF SURGERY Thurs with CHG Soap.   If you chose to wash your hair, wash your hair first as usual with your normal shampoo. After you shampoo, rinse your hair and body thoroughly to remove the shampoo.  Then [employer] and genitals (private parts) with your normal soap and rinse thoroughly to remove soap.  After that Use CHG Soap as you would any other liquid soap. You can apply CHG directly to the skin and wash gently with a scrungie or a clean washcloth.   Apply the CHG Soap to your body  ONLY FROM THE NECK DOWN.  Do not use on open wounds or open sores. Avoid contact with your eyes, ears, mouth and genitals (private parts). Wash Face and genitals (private parts)  with your normal soap.   Wash thoroughly, paying special attention to the area where your surgery will be performed.  Thoroughly rinse your body with warm water from the neck down.  DO NOT shower/wash with your normal soap after using and rinsing off the CHG Soap.  Pat yourself dry with a CLEAN TOWEL.  8. Apply the Benzoyl Peroxide only the night before surgery.  Do Not use it the morning of surgery.  Wear CLEAN PAJAMAS to bed the night before surgery  Place CLEAN  SHEETS on your bed the night before your surgery  DO NOT SLEEP WITH PETS.   Day of Surgery: Take a shower with CHG soap. Wear Clean/Comfortable clothing the morning of surgery Do not apply any deodorants/lotions.   Remember to brush your teeth WITH YOUR REGULAR TOOTHPASTE.   Please read over the following fact sheets that you were given.

## 2021-03-10 ENCOUNTER — Telehealth: Payer: Self-pay | Admitting: Physical Medicine and Rehabilitation

## 2021-03-10 NOTE — Telephone Encounter (Signed)
Pt called wanting to get an appt for an inj in his lower back.  [phone]

## 2021-03-10 NOTE — Telephone Encounter (Signed)
Needs auth and scheduling for left S1 TF. Patient has not been scheduled.

## 2021-03-10 NOTE — Telephone Encounter (Signed)
Bilateral S1 TF on 11/07/2020. Ok to repeat if helped, same problem/side, and no new injury?

## 2021-03-13 ENCOUNTER — Other Ambulatory Visit: Payer: Self-pay

## 2021-03-13 ENCOUNTER — Ambulatory Visit (HOSPITAL_COMMUNITY)
Admission: RE | Admit: 2021-03-13 | Discharge: 2021-03-13 | Disposition: A | Discharge status: Home / Self Care | Payer: Medicare HMO | Attending: Orthopedic Surgery | Admitting: Orthopedic Surgery

## 2021-03-13 ENCOUNTER — Encounter (HOSPITAL_COMMUNITY): Payer: Self-pay | Admitting: Orthopedic Surgery

## 2021-03-13 ENCOUNTER — Ambulatory Visit (HOSPITAL_COMMUNITY): Payer: Medicare HMO | Admitting: Anesthesiology

## 2021-03-13 ENCOUNTER — Encounter (HOSPITAL_COMMUNITY)
Admission: RE | Disposition: A | Discharge status: Home / Self Care | Payer: Self-pay | Source: Home / Self Care | Attending: Orthopedic Surgery

## 2021-03-13 DIAGNOSIS — M7521 Bicipital tendinitis, right shoulder: Secondary | ICD-10-CM

## 2021-03-13 DIAGNOSIS — Z79899 Other long term (current) drug therapy: Secondary | ICD-10-CM | POA: Insufficient documentation

## 2021-03-13 DIAGNOSIS — M75121 Complete rotator cuff tear or rupture of right shoulder, not specified as traumatic: Secondary | ICD-10-CM

## 2021-03-13 DIAGNOSIS — M75101 Unspecified rotator cuff tear or rupture of right shoulder, not specified as traumatic: Secondary | ICD-10-CM | POA: Diagnosis present

## 2021-03-13 DIAGNOSIS — X58XXXA Exposure to other specified factors, initial encounter: Secondary | ICD-10-CM | POA: Diagnosis not present

## 2021-03-13 DIAGNOSIS — S43431A Superior glenoid labrum lesion of right shoulder, initial encounter: Secondary | ICD-10-CM

## 2021-03-13 HISTORY — PX: SHOULDER ARTHROSCOPY WITH SUBACROMIAL DECOMPRESSION AND BICEP TENDON REPAIR: SHX5689

## 2021-03-13 SURGERY — SHOULDER ARTHROSCOPY WITH SUBACROMIAL DECOMPRESSION AND BICEP TENDON REPAIR
Anesthesia: General | Laterality: Right

## 2021-03-13 MED ORDER — LACTATED RINGERS IV SOLN: INTRAVENOUS | Status: DC

## 2021-03-13 MED ORDER — FENTANYL CITRATE (PF) 100 MCG/2ML IJ SOLN
25.0000 ug | INTRAMUSCULAR | Status: DC | PRN
  Administered 2021-03-13: 25 ug via INTRAVENOUS

## 2021-03-13 MED ORDER — SODIUM CHLORIDE 0.9 % IR SOLN
Status: DC | PRN
  Administered 2021-03-13 (×2): 3000 mL

## 2021-03-13 MED ORDER — PHENYLEPHRINE 40 MCG/ML (10ML) SYRINGE FOR IV PUSH (FOR BLOOD PRESSURE SUPPORT)
PREFILLED_SYRINGE | INTRAVENOUS | Status: DC | PRN
  Administered 2021-03-13: 120 ug via INTRAVENOUS
  Administered 2021-03-13: 80 ug via INTRAVENOUS

## 2021-03-13 MED ORDER — MORPHINE SULFATE (PF) 4 MG/ML IV SOLN
INTRAVENOUS | Status: DC | PRN
  Administered 2021-03-13 (×2): 4 mg

## 2021-03-13 MED ORDER — KETOROLAC TROMETHAMINE 10 MG PO TABS: 10.0000 mg | ORAL_TABLET | Freq: Three times a day (TID) | ORAL | 0 refills | Status: DC | PRN

## 2021-03-13 MED ORDER — CLONIDINE HCL (ANALGESIA) 100 MCG/ML EP SOLN
EPIDURAL | Status: AC
  Filled 2021-03-13: qty 10

## 2021-03-13 MED ORDER — BUPIVACAINE-EPINEPHRINE (PF) 0.25% -1:200000 IJ SOLN
INTRAMUSCULAR | Status: AC
  Filled 2021-03-13: qty 30

## 2021-03-13 MED ORDER — FENTANYL CITRATE (PF) 100 MCG/2ML IJ SOLN
INTRAMUSCULAR | Status: AC
  Filled 2021-03-13: qty 2

## 2021-03-13 MED ORDER — ORAL CARE MOUTH RINSE: 15.0000 mL | Freq: Once | OROMUCOSAL | Status: AC

## 2021-03-13 MED ORDER — OXYCODONE-ACETAMINOPHEN 5-325 MG PO TABS: 1.0000 | ORAL_TABLET | ORAL | 0 refills | Status: DC | PRN

## 2021-03-13 MED ORDER — ONDANSETRON HCL 4 MG/2ML IJ SOLN
4.0000 mg | Freq: Once | INTRAMUSCULAR | Status: AC
  Administered 2021-03-13: 4 mg via INTRAVENOUS

## 2021-03-13 MED ORDER — ONDANSETRON HCL 4 MG/2ML IJ SOLN
INTRAMUSCULAR | Status: AC
  Administered 2021-03-13: 4 mg
  Filled 2021-03-13: qty 2

## 2021-03-13 MED ORDER — PROPOFOL 10 MG/ML IV BOLUS
INTRAVENOUS | Status: DC | PRN
  Administered 2021-03-13: 200 mg via INTRAVENOUS

## 2021-03-13 MED ORDER — ONDANSETRON HCL 4 MG/2ML IJ SOLN
INTRAMUSCULAR | Status: AC
  Filled 2021-03-13: qty 2

## 2021-03-13 MED ORDER — PROMETHAZINE HCL 25 MG/ML IJ SOLN: 6.2500 mg | INTRAMUSCULAR | Status: DC | PRN

## 2021-03-13 MED ORDER — MIDAZOLAM HCL 5 MG/5ML IJ SOLN
INTRAMUSCULAR | Status: DC | PRN
  Administered 2021-03-13: 2 mg via INTRAVENOUS

## 2021-03-13 MED ORDER — MORPHINE SULFATE (PF) 4 MG/ML IV SOLN
INTRAVENOUS | Status: AC
  Filled 2021-03-13: qty 2

## 2021-03-13 MED ORDER — PROPOFOL 10 MG/ML IV BOLUS
INTRAVENOUS | Status: AC
  Filled 2021-03-13: qty 20

## 2021-03-13 MED ORDER — CEFAZOLIN SODIUM-DEXTROSE 2-4 GM/100ML-% IV SOLN
2.0000 g | INTRAVENOUS | Status: AC
  Administered 2021-03-13: 2 g via INTRAVENOUS
  Filled 2021-03-13: qty 100

## 2021-03-13 MED ORDER — FENTANYL CITRATE (PF) 100 MCG/2ML IJ SOLN
INTRAMUSCULAR | Status: DC | PRN
  Administered 2021-03-13: 50 ug via INTRAVENOUS
  Administered 2021-03-13: 100 ug via INTRAVENOUS
  Administered 2021-03-13 (×2): 50 ug via INTRAVENOUS

## 2021-03-13 MED ORDER — EPINEPHRINE PF 1 MG/ML IJ SOLN
INTRAMUSCULAR | Status: DC | PRN
  Administered 2021-03-13: 1 mg

## 2021-03-13 MED ORDER — PHENYLEPHRINE 40 MCG/ML (10ML) SYRINGE FOR IV PUSH (FOR BLOOD PRESSURE SUPPORT): PREFILLED_SYRINGE | INTRAVENOUS | Status: DC | PRN

## 2021-03-13 MED ORDER — FENTANYL CITRATE (PF) 250 MCG/5ML IJ SOLN
INTRAMUSCULAR | Status: AC
  Filled 2021-03-13: qty 5

## 2021-03-13 MED ORDER — EPINEPHRINE PF 1 MG/ML IJ SOLN
INTRAMUSCULAR | Status: AC
  Filled 2021-03-13: qty 2

## 2021-03-13 MED ORDER — CLONIDINE HCL (ANALGESIA) 100 MCG/ML EP SOLN
EPIDURAL | Status: DC | PRN
  Administered 2021-03-13: 100 ug

## 2021-03-13 MED ORDER — POVIDONE-IODINE 10 % EX SWAB
2.0000 "application " | Freq: Once | CUTANEOUS | Status: AC
  Administered 2021-03-13: 2 via TOPICAL

## 2021-03-13 MED ORDER — BUPIVACAINE HCL (PF) 0.25 % IJ SOLN
INTRAMUSCULAR | Status: AC
  Filled 2021-03-13: qty 30

## 2021-03-13 MED ORDER — PHENYLEPHRINE HCL-NACL 20-0.9 MG/250ML-% IV SOLN
INTRAVENOUS | Status: DC | PRN
  Administered 2021-03-13: 25 ug/min via INTRAVENOUS

## 2021-03-13 MED ORDER — HYDROMORPHONE HCL 1 MG/ML IJ SOLN
INTRAMUSCULAR | Status: DC | PRN
  Administered 2021-03-13: .5 mg via INTRAVENOUS

## 2021-03-13 MED ORDER — MIDAZOLAM HCL 2 MG/2ML IJ SOLN
INTRAMUSCULAR | Status: AC
  Filled 2021-03-13: qty 2

## 2021-03-13 MED ORDER — EPHEDRINE SULFATE 50 MG/ML IJ SOLN
INTRAMUSCULAR | Status: DC | PRN
  Administered 2021-03-13: 5 mg via INTRAVENOUS

## 2021-03-13 MED ORDER — BUPIVACAINE HCL (PF) 0.25 % IJ SOLN
INTRAMUSCULAR | Status: DC | PRN
  Administered 2021-03-13: 30 mL

## 2021-03-13 MED ORDER — POVIDONE-IODINE 7.5 % EX SOLN: Freq: Once | CUTANEOUS | Status: AC

## 2021-03-13 MED ORDER — DEXMEDETOMIDINE HCL IN NACL 200 MCG/50ML IV SOLN
INTRAVENOUS | Status: DC | PRN
  Administered 2021-03-13 (×2): 4 ug via INTRAVENOUS

## 2021-03-13 MED ORDER — SUGAMMADEX SODIUM 200 MG/2ML IV SOLN
INTRAVENOUS | Status: DC | PRN
  Administered 2021-03-13: 200 mg via INTRAVENOUS

## 2021-03-13 MED ORDER — ROCURONIUM BROMIDE 10 MG/ML (PF) SYRINGE
PREFILLED_SYRINGE | INTRAVENOUS | Status: AC
  Filled 2021-03-13: qty 10

## 2021-03-13 MED ORDER — LIDOCAINE 2% (20 MG/ML) 5 ML SYRINGE
INTRAMUSCULAR | Status: DC | PRN
Start: 2021-03-13 — End: 2021-03-13
  Administered 2021-03-13: 60 mg via INTRAVENOUS

## 2021-03-13 MED ORDER — ACETAMINOPHEN 500 MG PO TABS
1000.0000 mg | ORAL_TABLET | Freq: Once | ORAL | Status: AC
  Administered 2021-03-13: 1000 mg via ORAL
  Filled 2021-03-13: qty 2

## 2021-03-13 MED ORDER — DEXAMETHASONE SODIUM PHOSPHATE 10 MG/ML IJ SOLN
INTRAMUSCULAR | Status: DC | PRN
  Administered 2021-03-13: 4 mg via INTRAVENOUS

## 2021-03-13 MED ORDER — LIDOCAINE 2% (20 MG/ML) 5 ML SYRINGE
INTRAMUSCULAR | Status: AC
  Filled 2021-03-13: qty 5

## 2021-03-13 MED ORDER — ONDANSETRON HCL 4 MG/2ML IJ SOLN
INTRAMUSCULAR | Status: DC | PRN
  Administered 2021-03-13: 4 mg via INTRAVENOUS

## 2021-03-13 MED ORDER — ROCURONIUM BROMIDE 100 MG/10ML IV SOLN
INTRAVENOUS | Status: DC | PRN
  Administered 2021-03-13: 60 mg via INTRAVENOUS

## 2021-03-13 MED ORDER — CHLORHEXIDINE GLUCONATE 0.12 % MT SOLN
15.0000 mL | Freq: Once | OROMUCOSAL | Status: AC
  Administered 2021-03-13: 15 mL via OROMUCOSAL
  Filled 2021-03-13: qty 15

## 2021-03-13 MED ORDER — HYDROMORPHONE HCL 1 MG/ML IJ SOLN
INTRAMUSCULAR | Status: AC
  Filled 2021-03-13: qty 0.5

## 2021-03-13 SURGICAL SUPPLY — 65 items
ANCHOR FBRTK 2.6 SUTURETAP 1.3 (Anchor) ×2 IMPLANT
ANCHOR SUT 1.8 FBRTK KNTLS 2SU (Anchor) ×2 IMPLANT
ANCHOR SUT SWIVELLOK BIO (Anchor) ×2 IMPLANT
ANCHOR SWIVELOCK BIO 4.75X19.1 (Anchor) ×4 IMPLANT
BAG COUNTER SPONGE SURGICOUNT (BAG) ×2 IMPLANT
BLADE CUTTER GATOR 3.5 (BLADE) IMPLANT
BLADE EXCALIBUR 4.0X13 (MISCELLANEOUS) IMPLANT
BLADE SURG 11 STRL SS (BLADE) IMPLANT
BUR OVAL 6.0 (BURR) IMPLANT
CLSR STERI-STRIP ANTIMIC 1/2X4 (GAUZE/BANDAGES/DRESSINGS) ×2 IMPLANT
DRAPE INCISE IOBAN 66X45 STRL (DRAPES) ×2 IMPLANT
DRAPE STERI 35X30 U-POUCH (DRAPES) ×2 IMPLANT
DRAPE U-SHAPE 47X51 STRL (DRAPES) ×4 IMPLANT
DRSG TEGADERM 4X4.75 (GAUZE/BANDAGES/DRESSINGS) ×2 IMPLANT
DURAPREP 26ML APPLICATOR (WOUND CARE) ×2 IMPLANT
ELECT REM PT RETURN 9FT ADLT (ELECTROSURGICAL) ×2
ELECTRODE REM PT RTRN 9FT ADLT (ELECTROSURGICAL) ×1 IMPLANT
FILTER STRAW FLUID ASPIR (MISCELLANEOUS) ×2 IMPLANT
GAUZE SPONGE 4X4 12PLY STRL (GAUZE/BANDAGES/DRESSINGS) ×2 IMPLANT
GAUZE XEROFORM 1X8 LF (GAUZE/BANDAGES/DRESSINGS) ×2 IMPLANT
GAUZE XEROFORM 5X9 LF (GAUZE/BANDAGES/DRESSINGS) ×2 IMPLANT
GLOVE SRG 8 PF TXTR STRL LF DI (GLOVE) ×1 IMPLANT
GLOVE SURG LTX SZ7 (GLOVE) ×2 IMPLANT
GLOVE SURG LTX SZ8 (GLOVE) ×2 IMPLANT
GLOVE SURG UNDER LTX SZ7.5 (GLOVE) ×2 IMPLANT
GLOVE SURG UNDER POLY LF SZ8 (GLOVE) ×1
GOWN STRL REUS W/ TWL LRG LVL3 (GOWN DISPOSABLE) ×3 IMPLANT
GOWN STRL REUS W/TWL LRG LVL3 (GOWN DISPOSABLE) ×3
KIT BASIN OR (CUSTOM PROCEDURE TRAY) ×2 IMPLANT
KIT STR SPEAR 1.8 FBRTK DISP (KITS) ×2 IMPLANT
KIT TURNOVER KIT B (KITS) ×2 IMPLANT
MANIFOLD NEPTUNE II (INSTRUMENTS) ×2 IMPLANT
NDL SUT 6 .5 CRC .975X.05 MAYO (NEEDLE) ×1 IMPLANT
NEEDLE HYPO 25X1 1.5 SAFETY (NEEDLE) IMPLANT
NEEDLE MAYO TAPER (NEEDLE) ×1
NEEDLE SPNL 18GX3.5 QUINCKE PK (NEEDLE) ×2 IMPLANT
NS IRRIG 1000ML POUR BTL (IV SOLUTION) ×2 IMPLANT
PACK SHOULDER (CUSTOM PROCEDURE TRAY) ×2 IMPLANT
PAD ARMBOARD 7.5X6 YLW CONV (MISCELLANEOUS) ×4 IMPLANT
PORT APPOLLO RF 90DEGREE MULTI (SURGICAL WAND) IMPLANT
SLING ARM IMMOBILIZER LRG (SOFTGOODS) ×2 IMPLANT
SLING ARM IMMOBILIZER MED (SOFTGOODS) IMPLANT
SPONGE T-LAP 4X18 ~~LOC~~+RFID (SPONGE) ×4 IMPLANT
STRIP CLOSURE SKIN 1/2X4 (GAUZE/BANDAGES/DRESSINGS) IMPLANT
SUCTION FRAZIER HANDLE 10FR (MISCELLANEOUS)
SUCTION TUBE FRAZIER 10FR DISP (MISCELLANEOUS) IMPLANT
SUT ETHILON 3 0 PS 1 (SUTURE) ×2 IMPLANT
SUT FIBERWIRE 2-0 18 17.9 3/8 (SUTURE)
SUT VIC AB 0 CT1 27 (SUTURE) ×1
SUT VIC AB 0 CT1 27XBRD ANBCTR (SUTURE) ×1 IMPLANT
SUT VIC AB 1 CT1 27 (SUTURE)
SUT VIC AB 1 CT1 27XBRD ANBCTR (SUTURE) IMPLANT
SUT VIC AB 2-0 CT1 27 (SUTURE) ×1
SUT VIC AB 2-0 CT1 TAPERPNT 27 (SUTURE) ×1 IMPLANT
SUT VICRYL 0 UR6 27IN ABS (SUTURE) ×2 IMPLANT
SUTURE FIBERWR 2-0 18 17.9 3/8 (SUTURE) IMPLANT
SYR 20ML LL LF (SYRINGE) ×2 IMPLANT
SYR 3ML LL SCALE MARK (SYRINGE) ×2 IMPLANT
SYR TB 1ML LUER SLIP (SYRINGE) ×2 IMPLANT
SYS FBRTK BUTTON 2.6 (Anchor) ×2 IMPLANT
SYSTEM FBRTK BUTTON 2.6 (Anchor) ×1 IMPLANT
TOWEL GREEN STERILE (TOWEL DISPOSABLE) ×2 IMPLANT
TOWEL GREEN STERILE FF (TOWEL DISPOSABLE) ×2 IMPLANT
TUBING ARTHROSCOPY IRRIG 16FT (MISCELLANEOUS) ×2 IMPLANT
WATER STERILE IRR 1000ML POUR (IV SOLUTION) ×2 IMPLANT

## 2021-03-13 NOTE — Transfer of Care (Signed)
Immediate Anesthesia Transfer of Care Note  Patient: Erik Cook  Procedure(s) Performed: SHOULDER ARTHROSCOPY WITH SUBACROMIAL DECOMPRESSION, MNIN OPEN ROTATOR CUFF TEAR REPAIR,  BICEPS TENODESIS (Right)  Patient Location: PACU  Anesthesia Type:General  Level of Consciousness: awake, alert  and oriented  Airway & Oxygen Therapy: Patient Spontanous Breathing and Patient connected to face mask oxygen  Post-op Assessment: Report given to RN, Post -op Vital signs reviewed and stable and Patient moving all extremities X 4  Post vital signs: Reviewed and stable  Last Vitals:  Vitals Value Taken Time  BP    Temp    Pulse    Resp    SpO2      Last Pain:  Vitals:   03/13/21 1023  TempSrc:   PainSc: 7       Patients Stated Pain Goal: 4 (03/13/21 1023)  Complications: No notable events documented.

## 2021-03-13 NOTE — Anesthesia Preprocedure Evaluation (Addendum)
Anesthesia Evaluation  Patient identified by MRN, date of birth, ID band Patient awake    Reviewed: Allergy & Precautions, NPO status , Patient's Chart, lab work & pertinent test results  Airway Mallampati: II  TM Distance: >3 FB Neck ROM: Full    Dental  (+) Teeth Intact, Dental Advisory Given   Pulmonary sleep apnea ,    Pulmonary exam normal breath sounds clear to auscultation       Cardiovascular hypertension, Pt. on medications Normal cardiovascular exam Rhythm:Regular Rate:Normal     Neuro/Psych  Neuromuscular disease    GI/Hepatic Neg liver ROS, GERD  ,  Endo/Other  Obesity   Renal/GU negative Renal ROS     Musculoskeletal  (+) Arthritis ,  right shoulder supraspinatus, subscapular tear   Abdominal   Peds  Hematology negative hematology ROS (+)   Anesthesia Other Findings Day of surgery medications reviewed with the patient.  Reproductive/Obstetrics                             Anesthesia Physical Anesthesia Plan  ASA: 2  Anesthesia Plan: General   Post-op Pain Management:    Induction: Intravenous  PONV Risk Score and Plan: 2 and Midazolam, Dexamethasone and Ondansetron  Airway Management Planned: Oral ETT  Additional Equipment:   Intra-op Plan:   Post-operative Plan: Extubation in OR  Informed Consent: I have reviewed the patients History and Physical, chart, labs and discussed the procedure including the risks, benefits and alternatives for the proposed anesthesia with the patient or authorized representative who has indicated his/her understanding and acceptance.     Dental advisory given  Plan Discussed with: CRNA  Anesthesia Plan Comments: (Patient declines pre-op nerve block.)       Anesthesia Quick Evaluation

## 2021-03-13 NOTE — H&P (Signed)
Erik Cook is an 60 y.o. male.   Chief Complaint: right shoulder pain HPI: Erik Cook is a 60 year old patient who reports fall on outstretched arm 3 months ago.  He is having pain in the joint.  Not taking any pain medicine.  He has had an MRI scan.  Old notes are reviewed.  Cortisone injection 01/03/2021 helped minimally.  MRI scan also reviewed with the patient which shows full-thickness full width tear of the supraspinatus tendon with 3 cm of retraction as well as some high-grade tearing of the subscap tendon and no muscle atrophy  Past Medical History:  Diagnosis Date   Arthritis    knees, lower back   GERD (gastroesophageal reflux disease)    no current problem - no meds   HLD (hyperlipidemia)    no meds - diet controlled   Hypertension    Sleep apnea    does not use cpap    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 No pertinent family history. Social History:  reports that he has never smoked. He has never used smokeless tobacco. He reports that he does not drink alcohol and does not use drugs.  Allergies: No Known Allergies  Medications Prior to Admission  Medication Sig Dispense Refill   acetaminophen (TYLENOL) 650 MG CR tablet Take 650-1,300 mg by mouth every 8 (eight) hours as needed for pain.     baclofen (LIORESAL) 10 MG tablet TAKE 1 TABLET BY MOUTH THREE TIMES DAILY (Patient taking differently: Take 10 mg by mouth 3 (three) times daily as needed.) 60 tablet 0   lisinopril-hydrochlorothiazide  (ZESTORETIC) 20-25 MG tablet Take 1 tablet by mouth at bedtime.     meloxicam (MOBIC) 15 MG tablet Take 15 mg by mouth at bedtime.     pregabalin (LYRICA) 75 MG capsule Take 75 mg by mouth in the morning and at bedtime.      No results found for this or any previous visit (from the past 48 hour(s)). No results found.  Review of Systems  Musculoskeletal:  Positive for arthralgias.  All other systems reviewed and are negative.  Blood pressure 122/78, pulse 71, temperature 97.7 F (36.5 C), temperature source Oral, resp. rate 18, height 5\' 9"  (1.753 m), weight 95.3 kg, SpO2 96 %. Physical Exam Vitals reviewed.  HENT:     Head: Normocephalic.     Nose: Nose normal.     Mouth/Throat:     Mouth: Mucous membranes are moist.  Eyes:     Pupils: Pupils are equal, round, and reactive to light.  Cardiovascular:     Rate and Rhythm: Normal rate.     Pulses: Normal pulses.  Pulmonary:     Effort: Pulmonary effort is normal.  Abdominal:     General: Abdomen is flat.  Musculoskeletal:     Cervical back: Normal range of motion.  Skin:    General: Skin is warm.     Capillary Refill: Capillary  refill takes less than 2 seconds.  Neurological:     General: No focal deficit present.     Mental Status: He is alert.  Psychiatric:        Mood and Affect: Mood normal.  Examination of the right shoulderOrtho exam demonstrates full active and passive range of motion of both arms.  Passive range of motion bilaterally is approximately 55/100/170.  Has a little bit of coarse grinding and crepitus with passive range of motion on the right at 90 degrees of abduction.  Surprisingly not a lot of weakness with internal and external rotation as well as subscap testing.  No discrete AC joint tenderness right versus left  Assessment/Plan  Impression is right shoulder rotator cuff tear with no atrophy yet but a fair amount of retraction.  He is 60 years old and does active things.  I think he would do well with  rotator cuff repair and biceps tenodesis.  We would also have to look at the upper border of the subscap at the time of surgery with possible fixation placed there as well.  Unfortunately the patient is having some major financial issues at this time.  Cannot undergo surgery at this time.  I have asked Eunice Blase to talk to him about resources from clinic could help him.  When he gets this sorted out I think surgery within the next 3 to 4 months would be indicated to allow for tendon repair and healing.  The risk and benefits are discussed with the patient including not limited to infection nerve vessel damage shoulder stiffness as well as the extensive rehabilitative process required  Burnard Bunting, MD 03/13/2021, 12:10 PM

## 2021-03-13 NOTE — Anesthesia Procedure Notes (Addendum)
Procedure Name: Intubation Date/Time: 03/13/2021 1:08 PM Performed by: Annamary Carolin, CRNA Pre-anesthesia Checklist: Patient identified, Emergency Drugs available, Suction available and Patient being monitored Patient Re-evaluated:Patient Re-evaluated prior to induction Oxygen Delivery Method: Circle System Utilized Preoxygenation: Pre-oxygenation with 100% oxygen Induction Type: IV induction Ventilation: Mask ventilation without difficulty Laryngoscope Size: Miller and 3 Grade View: Grade I Tube type: Oral Tube size: 7.5 mm Number of attempts: 2 (First attempt by CRNA with Mac 3, grade 4 view. 2nd attempt by Dr. Fransisco Beau with Mil 3, grade 1 view.) Airway Equipment and Method: Stylet and Oral airway Placement Confirmation: ETT inserted through vocal cords under direct vision, positive ETCO2 and breath sounds checked- equal and bilateral Secured at: 22 cm Tube secured with: Tape Dental Injury: Teeth and Oropharynx as per pre-operative assessment

## 2021-03-13 NOTE — Brief Op Note (Signed)
   03/13/2021  3:42 PM  PATIENT:  Bernerd Limbo  60 y.o. male  PRE-OPERATIVE DIAGNOSIS:  right shoulder supraspinatus, degenerative SLAP tear  POST-OPERATIVE DIAGNOSIS:  right shoulder supraspinatus, degenerative SLAP tear  PROCEDURE:  Procedure(s): SHOULDER ARTHROSCOPY WITH SUBACROMIAL DECOMPRESSION, MNIN OPEN ROTATOR CUFF TEAR REPAIR,  BICEPS TENODESIS  SURGEON:  Surgeon(s): August Saucer, Corrie Mckusick, MD  ASSISTANT: Karenann Cai, PA  ANESTHESIA:   general  EBL: 25 ml    Total I/O In: -  Out: 200 [Blood:200]  BLOOD ADMINISTERED: none  DRAINS: none   LOCAL MEDICATIONS USED: Marcaine morphine clonidine  SPECIMEN:  No Specimen  COUNTS:  YES  TOURNIQUET:  * No tourniquets in log *  DICTATION: .Other Dictation: Dictation Number [id number]  PLAN OF CARE: Discharge to home after PACU  PATIENT DISPOSITION:  PACU - hemodynamically stable

## 2021-03-14 ENCOUNTER — Telehealth: Payer: Self-pay | Admitting: Physical Medicine and Rehabilitation

## 2021-03-14 NOTE — Op Note (Signed)
NAME: Erik Cook, Erik Cook MEDICAL RECORD NO: [id number] ACCOUNT NO: [id number] DATE OF BIRTH: [birth year] FACILITY: [facility] LOCATION: [facility] PHYSICIAN: Graylin Shiver. August Saucer, MD  Operative Report   DATE OF PROCEDURE: 03/13/2021   PREOPERATIVE DIAGNOSIS:  Right shoulder supraspinatus tear and degenerative SLAP tear.  POSTOPERATIVE DIAGNOSIS:  Right shoulder supraspinatus tear and degenerative SLAP tear.  PROCEDURE PERFORMED:  Right shoulder arthroscopy with debridement of the superior labrum and biceps tendon release with open biceps tenodesis and mini open rotator cuff tear repair.  SURGEON:  Graylin Shiver. August Saucer, MD.  ASSISTANT:  Karenann Cai, PA.  INDICATIONS:  This is a 60 year old patient with right shoulder pain.  He presents for operative management after supraspinatus tear with 3 cm of retraction noted along with some degenerative fraying and tearing of the superior labrum.  Had a fall about  six months ago.  No instability, but has had pain with MRI scan showing rotator cuff pathology.  DESCRIPTION OF PROCEDURE:  The patient was brought to the operating room where general endotracheal anesthesia was induced.  Preoperative antibiotics were administered.  Timeout was called.  The patient was placed in the beach chair position with the  head in neutral position.  Right shoulder, arm and hand prescrubbed with alcohol and Betadine.  Allowed to air day.  Prepped with DuraPrep solution and draped in a sterile manner.  Ioban used to seal the operative field as well as cover the axilla.   Saline was injected into the joint.  Skin and subcutaneous tissue were sharply divided for the posterior portal, which was 2 cm medial and inferior to the posterolateral margin of the acromion.  Diagnostic arthroscopy was performed.  Anterior portal  created under direct visualization.  The patient did have significant degenerative SLAP tear with instability of the anterior portion of the anchor.  Using the Arthrocare wand,  the biceps tendon was debrided.  Superior labrum was debrided.  The rotator  cuff tear appeared to have some mobility.  It was a large tear with retraction.  Glenohumeral articular surfaces were intact.  There was some fraying of the anterior inferior glenohumeral ligament, but no frank instability or detachment.  No clearcut  Hill-Sachs lesion visualized, although there was a small depression around the posterior lateral humeral head.  Following arthroscopic limited debridement and biceps tendon release instruments were removed.  Portals were closed using 3-0 nylon.  Ioban  then used to cover the entire operative field.  Anterior incision made off the anterolateral margin of the acromion.  Skin and subcutaneous tissue were sharply divided.  Measured distance of 5 cm.  Deltoid split between the anterior middle raphae was  marked with a #1 Vicryl suture.  Bursectomy performed.  Biceps tenodesis was then performed using Arthrex 2.6 anchor with 1.9 knotless SutureTak.  This gave very secure fixation under appropriate tension.  Oversewn x1 with 0 Vicryl suture.  Next,  attention was directed towards the rotator cuff.  A subacromial decompression was performed with removal of about 2-3 mm of the anterolateral margin of the acromion. four  0 Vicryl stay sutures were placed in the leading edge of the  supraspinatus tendon.  The tendon edges were rounded consistent with his relatively subacute/chronic tear.  Mobilization was performed with a Cobb elevator with release of the coracohumeral ligament as well as release of any subdeltoid adhesions.  Tendon  was then mobilized back nearly to its full attachment site.  The attachment footprint was prepared with a knife and a rongeur.  Two Arthrex suture  tape anchors were placed with four limbs in each anchor.  These 8 limbs were placed equidistant posterior,  anterior through the retracted tendon.  Then, using vicryl traction sutures, the suturetapes were tied and  crossed.  Anterior four suture tapes posterior were then mated with 4-0 Vicryl suture ends from the anterior and posterior aspect of the lateral aspect of the  rotator cuff tear.  These were then placed in the SwiveLock with the arm in abduction.  Overall, a very secure repair was achieved.  Thorough irrigation was then performed.  Instruments were removed.  The deltoid split was closed using #1 Vicryl suture  followed by interrupted inverted 2-0 Vicryl suture, 3-0 Monocryl and Steri-Strips with impervious dressings.  Abduction brace applied.  A solution of Marcaine, morphine, clonidine was injected both into the skin edges as well as into the joint.  Luke's  assistance was required at all times for retraction, mobilization of tissue, opening, closing.  His assistance was a medical necessity.   SUJ D: 03/13/2021 3:48:38 pm T: 03/14/2021 2:40:00 am  JOB: [id number]/ [id number]

## 2021-03-14 NOTE — Telephone Encounter (Signed)
Patient called. He would like to speak with a nurse of Dr. Alvester Morin,. His call back number is [phone]

## 2021-03-14 NOTE — Anesthesia Postprocedure Evaluation (Signed)
Anesthesia Post Note  Patient: Erik Cook  Procedure(s) Performed: SHOULDER ARTHROSCOPY WITH SUBACROMIAL DECOMPRESSION, MNIN OPEN ROTATOR CUFF TEAR REPAIR,  BICEPS TENODESIS (Right)     Patient location during evaluation: PACU Anesthesia Type: General Level of consciousness: awake and alert Pain management: pain level controlled Vital Signs Assessment: post-procedure vital signs reviewed and stable Respiratory status: spontaneous breathing, nonlabored ventilation, respiratory function stable and patient connected to nasal cannula oxygen Cardiovascular status: stable and blood pressure returned to baseline Anesthetic complications: no   No notable events documented.  Last Vitals:  Vitals:   03/13/21 1830 03/13/21 1845  BP: 135/71 134/67  Pulse: 71 63  Resp: (!) 22 13  Temp:    SpO2: (!) 87% 100%                  Beryle Lathe

## 2021-03-14 NOTE — Telephone Encounter (Signed)
Returned patient's call and answered his questions

## 2021-03-18 ENCOUNTER — Encounter (HOSPITAL_COMMUNITY): Payer: Self-pay | Admitting: Orthopedic Surgery

## 2021-03-20 ENCOUNTER — Other Ambulatory Visit: Payer: Self-pay

## 2021-03-20 ENCOUNTER — Ambulatory Visit (INDEPENDENT_AMBULATORY_CARE_PROVIDER_SITE_OTHER): Payer: Medicare HMO | Admitting: Orthopedic Surgery

## 2021-03-20 DIAGNOSIS — M75121 Complete rotator cuff tear or rupture of right shoulder, not specified as traumatic: Secondary | ICD-10-CM

## 2021-03-21 ENCOUNTER — Encounter: Payer: Self-pay | Admitting: Orthopedic Surgery

## 2021-03-21 NOTE — Progress Notes (Signed)
Post-Op Visit Note   Patient: Erik Cook           Date of Birth: [birth year]           MRN: [id number] Visit Date: 03/20/2021 PCP: Veverly Fells, MD   Assessment & Plan:  Chief Complaint:  Chief Complaint  Patient presents with   Right Shoulder - Routine Post Op    03/13/21 right shoulder scope, BT, MORCTR, SAD   Visit Diagnoses:  1. Complete tear of right rotator cuff, unspecified whether traumatic     Plan: Patient is a 60 year old male who presents s/p left shoulder arthroscopy with subacromial decompression and mini open rotator cuff tear repair as well as biceps tenodesis on 03/13/2021.  He reports that he is doing well.  He is up to 90 degrees on CPM machine.  He had a lot of pain initially but it is significant improved.  He was not able to get any sleep in his first few nights.  He was also having some neck and radicular pain down his arm into his fingers initially after surgery but this is now gone by the wayside.  On exam, incisions are healing well.  Sutures removed and replaced with Steri-Strips.  He has 25 degrees external rotation, 75 degrees abduction, 90 degrees forward flexion.  Deltoid firing with axillary nerve intact.  Plan to continue with sling and no lifting with the arm or lifting the arm under its own power for the next several weeks.  He will follow-up in 2 weeks for clinical recheck with Dr. August Saucer and initiate physical therapy at that time.  In the meantime, continue with CPM machine.  Reviewed patient's arthroscopy pictures with him today.  Follow-Up Instructions: No follow-ups on file.   Orders:  No orders of the defined types were placed in this encounter.  No orders of the defined types were placed in this encounter.   Imaging: No results found.  PMFS History: Patient Active Problem List   Diagnosis Date Noted   Lumbar radiculopathy 09/28/2016   Past Medical History:  Diagnosis Date   Arthritis    knees, lower back   GERD  (gastroesophageal reflux disease)    no current problem - no meds   HLD (hyperlipidemia)    no meds - diet controlled   Hypertension    Sleep apnea    does not use cpap    No family history on file.  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   EYE SURGERY Bilateral    Lasik   KNEE ARTHROSCOPY     left   KNEE ARTHROSCOPY     on left side again   ROTATOR CUFF REPAIR     left   ROTATOR CUFF REPAIR     2nd time around (after fall)   SHOULDER ARTHROSCOPY WITH SUBACROMIAL DECOMPRESSION AND BICEP TENDON REPAIR Right 03/13/2021   Procedure: SHOULDER ARTHROSCOPY WITH SUBACROMIAL DECOMPRESSION, MNIN OPEN ROTATOR CUFF TEAR REPAIR,  BICEPS TENODESIS;  Surgeon: Cammy Copa, MD;  Location: [facility] OR;  Service: Orthopedics;  Laterality: Right;   Social History   Occupational History   Not on file  Tobacco Use   Smoking status: Never   Smokeless tobacco: Never  Vaping Use   Vaping Use: Never used  Substance and Sexual Activity   Alcohol use: No    Comment: occasional beer - twice month   Drug use: No   Sexual activity: Not on file

## 2021-03-29 DIAGNOSIS — M75121 Complete rotator cuff tear or rupture of right shoulder, not specified as traumatic: Secondary | ICD-10-CM

## 2021-03-29 DIAGNOSIS — M7521 Bicipital tendinitis, right shoulder: Secondary | ICD-10-CM

## 2021-03-29 DIAGNOSIS — S43431A Superior glenoid labrum lesion of right shoulder, initial encounter: Secondary | ICD-10-CM

## 2021-04-02 ENCOUNTER — Other Ambulatory Visit: Payer: Self-pay

## 2021-04-02 ENCOUNTER — Ambulatory Visit (INDEPENDENT_AMBULATORY_CARE_PROVIDER_SITE_OTHER): Payer: Medicare HMO | Admitting: Orthopedic Surgery

## 2021-04-02 ENCOUNTER — Encounter: Payer: Self-pay | Admitting: Orthopedic Surgery

## 2021-04-02 DIAGNOSIS — M75121 Complete rotator cuff tear or rupture of right shoulder, not specified as traumatic: Secondary | ICD-10-CM

## 2021-04-02 NOTE — Progress Notes (Signed)
Post-Op Visit Note   Patient: Erik Cook           Date of Birth: 1961-04-30           MRN: [id number] Visit Date: 04/02/2021 PCP: Veverly Fells, MD   Assessment & Plan:  Chief Complaint:  Chief Complaint  Patient presents with   Right Shoulder - Post-op Follow-up   Visit Diagnoses:  1. Complete tear of right rotator cuff, unspecified whether traumatic     Plan: Erik Cook is a 60 year old patient who is now 3 weeks out right shoulder arthroscopy with mini open rotator cuff tear repair biceps tenodesis performed 03/13/2021.  Is having some pain radiating down the biceps into digits 3 and 4.  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  This affects his left shoulder and arm in a duller type fashion.  Is affecting his right arm currently.  Mobic helps some.  On exam he is got pretty reasonable range of motion with no coarse grinding or crepitus.  Passively I can get him to about 35 degrees with 90 of flexion and about 140 of forward flexion.  Rotator cuff strength is reasonable with no coarse grinding or crepitus with passive range of motion.  Plan at this time is discontinue sling with no lifting with that right arm for the next 3 weeks.  Continue with passive range of motion for the next 3 weeks.  He is doing this at home and we talked about it.  He wants to he, as his recovery by not going to therapy.  Okay to start gentle strengthening exercises in 3 weeks as well as overhead motion.  Come back in 5 weeks for clinical recheck.  [place]  Follow-Up Instructions: Return for after MRI.   Orders:  No orders of the defined types were placed in this encounter.  No orders of the defined types were placed in this encounter.   Imaging: No results found.  PMFS History: Patient Active Problem List   Diagnosis Date Noted   Complete tear of right rotator cuff    Biceps tendonitis on right    Degenerative superior labral anterior-to-posterior (SLAP)  tear of right shoulder    Lumbar radiculopathy 09/28/2016   Past Medical History:  Diagnosis Date   Arthritis    knees, lower back   GERD (gastroesophageal reflux disease)    no current problem - no meds   HLD (hyperlipidemia)    no meds - diet controlled   Hypertension    Sleep apnea    does not use cpap    History reviewed. No pertinent family history.  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   EYE SURGERY Bilateral    Lasik   KNEE ARTHROSCOPY     left   KNEE ARTHROSCOPY     on left side again   ROTATOR CUFF REPAIR     left   ROTATOR CUFF REPAIR     2nd time around (after fall)   SHOULDER ARTHROSCOPY WITH SUBACROMIAL DECOMPRESSION AND BICEP TENDON REPAIR Right 03/13/2021   Procedure: SHOULDER ARTHROSCOPY WITH SUBACROMIAL DECOMPRESSION, MNIN OPEN ROTATOR CUFF TEAR REPAIR,  BICEPS TENODESIS;  Surgeon: Cammy Copa, MD;  Location: [facility] OR;  Service: Orthopedics;  Laterality:  Right;   Social History   Occupational History   Not on file  Tobacco Use   Smoking status: Never   Smokeless tobacco: Never  Vaping Use   Vaping Use: Never used  Substance and Sexual Activity   Alcohol use: No    Comment: occasional beer - twice month   Drug use: No   Sexual activity: Not on file

## 2021-04-08 ENCOUNTER — Ambulatory Visit: Payer: Self-pay

## 2021-04-08 ENCOUNTER — Other Ambulatory Visit: Payer: Self-pay

## 2021-04-08 ENCOUNTER — Encounter: Payer: Self-pay | Admitting: Physical Medicine and Rehabilitation

## 2021-04-08 ENCOUNTER — Ambulatory Visit (INDEPENDENT_AMBULATORY_CARE_PROVIDER_SITE_OTHER): Payer: Medicare HMO | Admitting: Physical Medicine and Rehabilitation

## 2021-04-08 VITALS — BP 123/83 | HR 87

## 2021-04-08 DIAGNOSIS — M5116 Intervertebral disc disorders with radiculopathy, lumbar region: Secondary | ICD-10-CM

## 2021-04-08 MED ORDER — METHYLPREDNISOLONE ACETATE 80 MG/ML IJ SUSP
80.0000 mg | Freq: Once | INTRAMUSCULAR | Status: AC
  Administered 2021-04-08: 80 mg

## 2021-04-08 NOTE — Patient Instructions (Signed)

## 2021-04-08 NOTE — Progress Notes (Signed)
Pt state lower back pain that travels down his left leg. Pt state standing and step down on with his leg makes the pain worse. Pt state walking up stairs is very painful. Pt state he take over the counter pain meds to help ease hiws pain.   Numeric Pain Rating Scale and Functional Assessment Average Pain 7   In the last MONTH (on 0-10 scale) has pain interfered with the following?  1. General activity like being  able to carry out your everyday physical activities such as walking, climbing stairs, carrying groceries, or moving a chair?  Rating(10)   +Driver, -BT, -Dye Allergies.

## 2021-04-09 ENCOUNTER — Encounter: Payer: Self-pay | Admitting: Specialist

## 2021-04-09 ENCOUNTER — Ambulatory Visit (INDEPENDENT_AMBULATORY_CARE_PROVIDER_SITE_OTHER): Payer: Medicare HMO | Admitting: Specialist

## 2021-04-09 VITALS — BP 116/83 | HR 92 | Ht 69.0 in | Wt 210.0 lb

## 2021-04-09 DIAGNOSIS — M4322 Fusion of spine, cervical region: Secondary | ICD-10-CM

## 2021-04-09 DIAGNOSIS — R202 Paresthesia of skin: Secondary | ICD-10-CM

## 2021-04-09 DIAGNOSIS — M5416 Radiculopathy, lumbar region: Secondary | ICD-10-CM | POA: Diagnosis not present

## 2021-04-09 DIAGNOSIS — R200 Anesthesia of skin: Secondary | ICD-10-CM

## 2021-04-09 DIAGNOSIS — M4722 Other spondylosis with radiculopathy, cervical region: Secondary | ICD-10-CM | POA: Diagnosis not present

## 2021-04-09 DIAGNOSIS — G5601 Carpal tunnel syndrome, right upper limb: Secondary | ICD-10-CM

## 2021-04-09 DIAGNOSIS — M75121 Complete rotator cuff tear or rupture of right shoulder, not specified as traumatic: Secondary | ICD-10-CM

## 2021-04-09 NOTE — Progress Notes (Signed)
Office Visit Note   Patient: Erik Cook           Date of Birth: 1961/01/11           MRN: [id number] Visit Date: 04/09/2021              Requested by: No referring provider defined for this encounter. PCP: Veverly Fells, MD   Assessment & Plan: Visit Diagnoses:  1. Numbness and tingling of right arm   2. Complete tear of right rotator cuff, unspecified whether traumatic   3. Fusion of spine, cervical region   4. Spondylosis of cervical spine with radiculopathy   5. Radiculopathy, lumbar region   6. Other spondylosis with radiculopathy, cervical region     Plan: Avoid overhead lifting and overhead use of the arms. Do not lift greater than 5 lbs. Adjust head rest in vehicle to prevent hyperextension if rear ended. Take extra precautions to avoid falling. Right arm EMG/NCV repeat and compare with preop assessment. Had CTS but complains of weakness right arm and pain from right neck downwards post surgery.   Follow-Up Instructions: No follow-ups on file.   Orders:  No orders of the defined types were placed in this encounter.  No orders of the defined types were placed in this encounter.     Procedures: No procedures performed   Clinical Data: No additional findings.   Subjective: Chief Complaint  Patient presents with   Lower Back - Follow-up    Had a left S1 Th injection on 04/08/21 with Dr. Alvester Morin, he states that he got some relief from it, states that he was able to get down his stairs and not have pain   Neck - Follow-up    C/o pain from neck down the right arm, states during shoulder surgery he was in the beach chair position and that is the worst position he can be in, he would like to get an injection in his neck ordered.     60 year old right handed male with history of cervical spondylosis and lumbar spondylosis with foramenal narrowing in the cervical spine and lumbar spine. He reports having had right shoulder arthroscopic RCR And biceps tenodesis.  His shoulder pain is better but he is experiencing pain that radiates down the right arm into the middle right two middle fingers. He reports excruciating pain like a cold rod going down the right arm and it is a "7" of 10. Was having neck stiffness and weakness. 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    Review of Systems  Constitutional: Negative.   HENT: Negative.    Eyes: Negative.   Respiratory: Negative.    Cardiovascular: Negative.   Gastrointestinal: Negative.   Endocrine: Negative.   Genitourinary: Negative.   Musculoskeletal: Negative.   Skin: Negative.   Allergic/Immunologic: Negative.   Neurological: Negative.   Hematological: Negative.   Psychiatric/Behavioral: Negative.      Objective: Vital Signs: BP 116/83 (BP Location: Left Arm, Patient Position: Sitting)   Pulse 92   Ht 5\' 9"  (1.753 m)   Wt 210 lb (95.3 kg)   BMI 31.01 kg/m   Physical Exam Constitutional:      Appearance: He is well-developed.  HENT:     Head: Normocephalic and atraumatic.  Eyes:     Pupils: Pupils are equal, round, and reactive to light.  Pulmonary:     Effort: Pulmonary effort is normal.     Breath sounds: Normal  breath sounds.  Abdominal:     General: Bowel sounds are normal.     Palpations: Abdomen is soft.  Musculoskeletal:     Cervical back: Normal range of motion and neck supple.     Lumbar back: Negative right straight leg raise test and negative left straight leg raise test.  Skin:    General: Skin is warm and dry.  Neurological:     Mental Status: He is alert and oriented to person, place, and time.  Psychiatric:        Behavior: Behavior normal.        Thought Content: Thought content normal.        Judgment: Judgment normal.    Back Exam   Tenderness  The patient is experiencing tenderness in the cervical.  Range of Motion  Extension:  60 abnormal  Flexion:  60 abnormal  Lateral bend right:  60 abnormal  Lateral bend left:  60  abnormal  Rotation right:  50 abnormal  Rotation left:  60   Muscle Strength  Right Quadriceps:  5/5  Left Quadriceps:  5/5  Right Hamstrings:  5/5  Left Hamstrings:  5/5   Tests  Straight leg raise right: negative Straight leg raise left: negative  Reflexes  Achilles:  2/4 Biceps:  2/4 Babinski's sign: normal   Other  Toe walk: normal Heel walk: normal Sensation: normal Gait: normal      Specialty Comments:  No specialty comments available.  Imaging: XR C-ARM NO REPORT  Result Date: 04/08/2021 Please see Notes tab for imaging impression.    PMFS History: Patient Active Problem List   Diagnosis Date Noted   Complete tear of right rotator cuff    Biceps tendonitis on right    Degenerative superior labral anterior-to-posterior (SLAP) tear of right shoulder    Lumbar radiculopathy 09/28/2016   Past Medical History:  Diagnosis Date   Arthritis    knees, lower back   GERD (gastroesophageal reflux disease)    no current problem - no meds   HLD (hyperlipidemia)    no meds - diet controlled   Hypertension    Sleep apnea    does not use cpap    No family history on file.  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   EYE SURGERY Bilateral    Lasik   KNEE ARTHROSCOPY     left   KNEE ARTHROSCOPY     on left side again   ROTATOR CUFF REPAIR     left   ROTATOR CUFF REPAIR     2nd time around (after fall)   SHOULDER ARTHROSCOPY WITH SUBACROMIAL DECOMPRESSION AND BICEP TENDON REPAIR Right 03/13/2021   Procedure: SHOULDER ARTHROSCOPY WITH SUBACROMIAL DECOMPRESSION, MNIN OPEN ROTATOR CUFF TEAR REPAIR,  BICEPS TENODESIS;  Surgeon: Cammy Copa, MD;  Location: [facility] OR;  Service: Orthopedics;   Laterality: Right;   Social History   Occupational History   Not on file  Tobacco Use   Smoking status: Never   Smokeless tobacco: Never  Vaping Use   Vaping Use: Never used  Substance and Sexual Activity   Alcohol use: No    Comment: occasional beer - twice month   Drug use: No   Sexual activity: Not on file

## 2021-04-09 NOTE — Patient Instructions (Signed)
Avoid overhead lifting and overhead use of the arms. Do not lift greater than 5 lbs. Adjust head rest in vehicle to prevent hyperextension if rear ended. Take extra precautions to avoid falling. Right arm EMG/NCV repeat and compare with preop assessment. Had CTS but complains of weakness right arm and pain from right neck downwards post surgery.

## 2021-04-19 NOTE — Progress Notes (Signed)
Erik Cook - 60 y.o. male MRN [id number]  Date of birth: 10-Mar-1961  Office Visit Note: Visit Date: 04/08/2021 PCP: Veverly Fells, MD Referred by: Veverly Fells, MD  Subjective: Chief Complaint  Patient presents with   Neck - Pain   Left Leg - Pain   HPI:  Erik Cook is a 60 y.o. male who comes in today for planned repeat Left S1-2  Lumbar Transforaminal epidural steroid injection with fluoroscopic guidance.  The patient has failed conservative care including home exercise, medications, time and activity modification.  This injection will be diagnostic and hopefully therapeutic.  Please see requesting physician notes for further details and justification. Prior injection did offer more than 50% relief as noted above and lasted more than 3 months and did give the patient more functional ability for activities of daily living and was also beneficial in that it did reduce their medication requirement.  They have had physical therapy and continue with directed home exercise program.  Current medication management is not beneficial in increasing their functional status.  Please note that procedures are done as part of a comprehensive orthopedic and pain management program with access to in-house orthopedics, spine surgery and physical therapy as well as access to [facility] biopsychosocial counseling if needed.    Referring: Dr. [name]   ROS Otherwise per HPI.  Assessment & Plan: Visit Diagnoses:    ICD-10-CM   1. Radiculopathy due to lumbar intervertebral disc disorder  M51.16 XR C-ARM NO REPORT    Epidural Steroid injection    methylPREDNISolone acetate (DEPO-MEDROL) injection 80 mg      Plan: No additional findings.   Meds & Orders:  Meds ordered this encounter  Medications   methylPREDNISolone acetate (DEPO-MEDROL) injection 80 mg    Orders Placed This Encounter  Procedures   XR C-ARM NO REPORT   Epidural Steroid injection    Follow-up: Return  for visit to requesting physician as needed.   Procedures: No procedures performed  S1 Lumbosacral Transforaminal Epidural Steroid Injection - Sub-Pedicular Approach with Fluoroscopic Guidance   Patient: Erik Cook      Date of Birth: 19-Jul-1961 MRN: [id number] PCP: Veverly Fells, MD      Visit Date: 04/08/2021   Universal Protocol:    Date/Time: 09/17/223:35 PM  Consent Given By: the patient  Position:  PRONE  Additional Comments: Vital signs were monitored before and after the procedure. Patient was prepped and draped in the usual sterile fashion. The correct patient, procedure, and site was verified.   Injection Procedure Details:  Procedure Site One Meds Administered:  Meds ordered this encounter  Medications   methylPREDNISolone acetate (DEPO-MEDROL) injection 80 mg    Laterality: Left  Location/Site:  S1 Foramen   Needle size: 22 ga.  Needle type: Spinal  Needle Placement: Transforaminal  Findings:   -Comments: Excellent flow of contrast along the nerve, nerve root and into the epidural space.  Epidurogram: Contrast epidurogram showed no nerve root cut off or restricted flow pattern.  Procedure Details: After squaring off the sacral end-plate to get a true AP view, the C-arm was positioned so that the best possible view of the S1 foramen was visualized. The soft tissues overlying this structure were infiltrated with 2-3 ml. of 1% Lidocaine without Epinephrine.    The spinal needle was inserted toward the target using a "trajectory" view along the fluoroscope beam.  Under AP and lateral visualization, the needle was advanced so it did not puncture  dura. Biplanar projections were used to confirm position. Aspiration was confirmed to be negative for CSF and/or blood. A 1-2 ml. volume of Isovue-250 was injected and flow of contrast was noted at each level. Radiographs were obtained for documentation purposes.   After attaining the desired flow of contrast  documented above, a 0.5 to 1.0 ml test dose of 0.25% Marcaine was injected into each respective transforaminal space.  The patient was observed for 90 seconds post injection.  After no sensory deficits were reported, and normal lower extremity motor function was noted,   the above injectate was administered so that equal amounts of the injectate were placed at each foramen (level) into the transforaminal epidural space.   Additional Comments:  The patient tolerated the procedure well Dressing: Band-Aid with 2 x 2 sterile gauze    Post-procedure details: Patient was observed during the procedure. Post-procedure instructions were reviewed.  Patient left the clinic in stable condition.   Clinical History: 2018 emg/ncv  Impression: Essentially NORMAL electrodiagnostic study of both lower limbs.  There is no significant electrodiagnostic evidence of nerve entrapment, lumbosacral plexopathy, lumbar radiculopathy or generalized peripheral neuropathy.     As you know, purely sensory or demyelinating radiculopathies and chemical radiculitis may not be detected with this particular electrodiagnostic study.   Recommendations: 1.  Follow-up with referring physician. 2.  Continue current management of symptoms. -----    MRI CERVICAL SPINE WITHOUT CONTRAST   TECHNIQUE: Multiplanar, multisequence MR imaging of the cervical spine was performed. No intravenous contrast was administered.   COMPARISON: 01/25/2019   FINDINGS: Alignment: Normal   Vertebrae: C4-5 ACDF. No acute abnormality. Mild facet edema on the left at C3-4.   Cord: Normal   Posterior Fossa, vertebral arteries, paraspinal tissues: Normal   Disc levels:   C1-2: Unremarkable.   C2-3: Normal disc space and facet joints. There is no spinal canal stenosis. No neural foraminal stenosis.   C3-4: Mild left facet edema is new since the prior study. There is mild bilateral uncovertebral hypertrophy. There is no spinal  canal stenosis. Mild bilateral neural foraminal stenosis.   C4-5: ACDF. There is no spinal canal stenosis. No neural foraminal stenosis.   C5-6: Small disc bulge. Unchanged right uncovertebral osteophyte. There is no spinal canal stenosis. Moderate right neural foraminal stenosis.   C6-7: Left asymmetric disc bulge with left-greater-than-right uncovertebral hypertrophy. There is no spinal canal stenosis. Mild right and moderate left, unchanged neural foraminal stenosis.   C7-T1: Normal disc space and facet joints. There is no spinal canal stenosis. No neural foraminal stenosis.   IMPRESSION: 1. Mild left C3-4 facet edema is new since the prior study and could be a source of local neck pain. 2. Moderate right C5-6 neural foraminal stenosis, unchanged. 3. Mild right and moderate left C6-7 neural foraminal stenosis, unchanged.     Electronically Signed By: Deatra Robinson M.D. On: 03/18/2020 19:31 --------------------------  MRI LUMBAR SPINE WITHOUT CONTRAST     TECHNIQUE:  Multiplanar, multisequence MR imaging of the lumbar spine was  performed. No intravenous contrast was administered.     COMPARISON: Radiography 12/30/2017     FINDINGS:  Segmentation: 5 lumbar type vertebral bodies     Alignment: Mild anterolisthesis at L5-S1     Vertebrae: No fracture, evidence of discitis, or aggressive bone  lesion. L2 hemangioma     Conus medullaris and cauda equina: Conus extends to the L1 level.  Conus and cauda equina appear normal.     Paraspinal and other soft tissues:  Negative     Disc levels:     T12- L1: Unremarkable.     L1-L2: Mild facet hypertrophy. No impingement     L2-L3: Disc narrowing and bulging. Mild facet hypertrophy.  Noncompressive spinal canal and foraminal narrowing.     L3-L4: Disc narrowing and bulging. Mild facet spurring. Right  foraminal impingement. Mild spinal stenosis.     L4-L5: Disc narrowing and bulging with endplate spurring. Negative   facets. Noncompressive bilateral foraminal narrowing. Mild spinal  stenosis     L5-S1:Mild degenerative facet spurring. Mild disc bulging. There is  left subarticular recess narrowing without convincing S1  impingement.     IMPRESSION:  1. Disc narrowing and bulging at L2-3 to L4-5 with mild spinal  stenosis.  2. L3-4 moderate to advanced right foraminal stenosis. There is  noncompressive foraminal narrowing the other levels of disc  degeneration.        Electronically Signed  By: Marnee Spring M.D.  On: 04/17/2018 10:18     Objective:  VS:  HT:    WT:   BMI:     BP:123/83  HR:87bpm  TEMP: ( )  RESP:  Physical Exam Vitals and nursing note reviewed.  Constitutional:      General: He is not in acute distress.    Appearance: Normal appearance. He is not ill-appearing.  HENT:     Head: Normocephalic and atraumatic.     Right Ear: External ear normal.     Left Ear: External ear normal.     Nose: No congestion.  Eyes:     Extraocular Movements: Extraocular movements intact.  Cardiovascular:     Rate and Rhythm: Normal rate.     Pulses: Normal pulses.  Pulmonary:     Effort: Pulmonary effort is normal. No respiratory distress.  Abdominal:     General: There is no distension.     Palpations: Abdomen is soft.  Musculoskeletal:        General: No tenderness or signs of injury.     Cervical back: Neck supple.     Right lower leg: No edema.     Left lower leg: No edema.     Comments: Patient has good distal strength without clonus.  Positive left slump test.  Dysesthesias in S1 dermatome.  Skin:    Findings: No erythema or rash.  Neurological:     General: No focal deficit present.     Mental Status: He is alert and oriented to person, place, and time.     Sensory: No sensory deficit.     Motor: No weakness or abnormal muscle tone.     Coordination: Coordination normal.  Psychiatric:        Mood and Affect: Mood normal.        Behavior: Behavior normal.      Imaging: No results found.

## 2021-04-19 NOTE — Procedures (Signed)
S1 Lumbosacral Transforaminal Epidural Steroid Injection - Sub-Pedicular Approach with Fluoroscopic Guidance   Patient: Erik Cook      Date of Birth: 03/20/1961 MRN: [id number] PCP: Veverly Fells, MD      Visit Date: 04/08/2021   Universal Protocol:    Date/Time: 09/17/223:35 PM  Consent Given By: the patient  Position:  PRONE  Additional Comments: Vital signs were monitored before and after the procedure. Patient was prepped and draped in the usual sterile fashion. The correct patient, procedure, and site was verified.   Injection Procedure Details:  Procedure Site One Meds Administered:  Meds ordered this encounter  Medications   methylPREDNISolone acetate (DEPO-MEDROL) injection 80 mg    Laterality: Left  Location/Site:  S1 Foramen   Needle size: 22 ga.  Needle type: Spinal  Needle Placement: Transforaminal  Findings:   -Comments: Excellent flow of contrast along the nerve, nerve root and into the epidural space.  Epidurogram: Contrast epidurogram showed no nerve root cut off or restricted flow pattern.  Procedure Details: After squaring off the sacral end-plate to get a true AP view, the C-arm was positioned so that the best possible view of the S1 foramen was visualized. The soft tissues overlying this structure were infiltrated with 2-3 ml. of 1% Lidocaine without Epinephrine.    The spinal needle was inserted toward the target using a "trajectory" view along the fluoroscope beam.  Under AP and lateral visualization, the needle was advanced so it did not puncture dura. Biplanar projections were used to confirm position. Aspiration was confirmed to be negative for CSF and/or blood. A 1-2 ml. volume of Isovue-250 was injected and flow of contrast was noted at each level. Radiographs were obtained for documentation purposes.   After attaining the desired flow of contrast documented above, a 0.5 to 1.0 ml test dose of 0.25% Marcaine was injected into each  respective transforaminal space.  The patient was observed for 90 seconds post injection.  After no sensory deficits were reported, and normal lower extremity motor function was noted,   the above injectate was administered so that equal amounts of the injectate were placed at each foramen (level) into the transforaminal epidural space.   Additional Comments:  The patient tolerated the procedure well Dressing: Band-Aid with 2 x 2 sterile gauze    Post-procedure details: Patient was observed during the procedure. Post-procedure instructions were reviewed.  Patient left the clinic in stable condition.

## 2021-04-29 ENCOUNTER — Encounter: Payer: Medicare HMO | Admitting: Physical Medicine and Rehabilitation

## 2021-04-29 ENCOUNTER — Other Ambulatory Visit: Payer: Self-pay

## 2021-05-07 ENCOUNTER — Ambulatory Visit (INDEPENDENT_AMBULATORY_CARE_PROVIDER_SITE_OTHER): Payer: Medicare HMO | Admitting: Orthopedic Surgery

## 2021-05-07 ENCOUNTER — Other Ambulatory Visit: Payer: Self-pay

## 2021-05-07 ENCOUNTER — Encounter: Payer: Self-pay | Admitting: Orthopedic Surgery

## 2021-05-07 DIAGNOSIS — M75121 Complete rotator cuff tear or rupture of right shoulder, not specified as traumatic: Secondary | ICD-10-CM

## 2021-05-10 ENCOUNTER — Encounter: Payer: Self-pay | Admitting: Orthopedic Surgery

## 2021-05-10 NOTE — Progress Notes (Signed)
Post-Op Visit Note   Patient: Erik Cook           Date of Birth: 1960/12/17           MRN: [id number] Visit Date: 05/07/2021 PCP: Veverly Fells, MD   Assessment & Plan:  Chief Complaint:  Chief Complaint  Patient presents with   Right Shoulder - Post-op Follow-up   Other    right shoulder arthroscopy with mini open rotator cuff tear repair biceps tenodesis performed 03/13/2021.    Visit Diagnoses:  1. Complete tear of right rotator cuff, unspecified whether traumatic     Plan: Patient is a 60 year old male who presents s/p right shoulder rotator cuff repair on 03/13/2021.  He reports 6/10 pain at times with aching pain that radiates down his right arm.  He is also seeing Dr. Otelia Sergeant who ordered repeat nerve conduction study and EMG of the right upper extremity for evaluation of radicular pain down the arm from the neck.  He went to his appointment for that but was turned away due to being 10 minutes late by his history and his unhappy with this outcome.  He reports he is doing a home exercise program every day for his right shoulder.  He feels like his range of motion is gradually improving.  He is taking over-the-counter Tylenol for pain control.  He just started doing resistance work last week with the shoulder.  On examination he has 40 degrees external rotation, 75 degrees abduction, 130 degrees forward flexion.  Incisions of healed very well.  Axillary nerve intact with deltoid firing.  Excellent strength of supraspinatus, infraspinatus, subscapularis.  There is no crepitus noted on exam.  Plan to continue with home exercise program and follow-up in 6 weeks for clinical recheck with Dr. August Saucer.  Follow-Up Instructions: No follow-ups on file.   Orders:  No orders of the defined types were placed in this encounter.  No orders of the defined types were placed in this encounter.   Imaging: No results found.  PMFS History: Patient Active Problem List   Diagnosis Date Noted    Complete tear of right rotator cuff    Biceps tendonitis on right    Degenerative superior labral anterior-to-posterior (SLAP) tear of right shoulder    Lumbar radiculopathy 09/28/2016   Past Medical History:  Diagnosis Date   Arthritis    knees, lower back   GERD (gastroesophageal reflux disease)    no current problem - no meds   HLD (hyperlipidemia)    no meds - diet controlled   Hypertension    Sleep apnea    does not use cpap    History reviewed. No pertinent family history.  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   EYE SURGERY Bilateral    Lasik   KNEE ARTHROSCOPY     left   KNEE ARTHROSCOPY     on left side again   ROTATOR CUFF REPAIR     left   ROTATOR CUFF REPAIR     2nd time around (after fall)   SHOULDER ARTHROSCOPY WITH SUBACROMIAL DECOMPRESSION AND BICEP TENDON REPAIR Right 03/13/2021   Procedure: SHOULDER ARTHROSCOPY WITH SUBACROMIAL DECOMPRESSION, MNIN OPEN ROTATOR CUFF TEAR REPAIR,  BICEPS TENODESIS;  Surgeon: Cammy Copa, MD;  Location: [facility]  OR;  Service: Orthopedics;  Laterality: Right;   Social History   Occupational History   Not on file  Tobacco Use   Smoking status: Never   Smokeless tobacco: Never  Vaping Use   Vaping Use: Never used  Substance and Sexual Activity   Alcohol use: No    Comment: occasional beer - twice month   Drug use: No   Sexual activity: Not on file

## 2021-05-14 ENCOUNTER — Ambulatory Visit (INDEPENDENT_AMBULATORY_CARE_PROVIDER_SITE_OTHER): Payer: Medicare HMO | Admitting: Specialist

## 2021-05-14 ENCOUNTER — Encounter: Payer: Self-pay | Admitting: Specialist

## 2021-05-14 ENCOUNTER — Other Ambulatory Visit: Payer: Self-pay

## 2021-05-14 VITALS — BP 123/80 | HR 67 | Ht 69.0 in | Wt 210.0 lb

## 2021-05-14 DIAGNOSIS — M5030 Other cervical disc degeneration, unspecified cervical region: Secondary | ICD-10-CM

## 2021-05-14 DIAGNOSIS — R200 Anesthesia of skin: Secondary | ICD-10-CM

## 2021-05-14 DIAGNOSIS — M48062 Spinal stenosis, lumbar region with neurogenic claudication: Secondary | ICD-10-CM

## 2021-05-14 DIAGNOSIS — M4322 Fusion of spine, cervical region: Secondary | ICD-10-CM

## 2021-05-14 DIAGNOSIS — M4722 Other spondylosis with radiculopathy, cervical region: Secondary | ICD-10-CM

## 2021-05-14 DIAGNOSIS — R202 Paresthesia of skin: Secondary | ICD-10-CM

## 2021-05-14 DIAGNOSIS — G5601 Carpal tunnel syndrome, right upper limb: Secondary | ICD-10-CM

## 2021-05-14 DIAGNOSIS — M5416 Radiculopathy, lumbar region: Secondary | ICD-10-CM

## 2021-05-14 NOTE — Patient Instructions (Signed)
Plan: Avoid overhead lifting and overhead use of the arms. Do not lift greater than 10 lbs. Tylenol ES one every 6-8 hours for pain and inflamation. MRI of the right shoulder ordered due to weakness not improved with blocking cuff and  Persistent weakness 10 weeks post fall. EMG/NCV right arm ordered due to numbness and paresthesias and weakness consistent with radiculopathy vs brachial plexopathy.  Avoid bending, stooping and avoid lifting weights greater than 10 lbs. Avoid prolong standing and walking. Avoid frequent bending and stooping  No lifting greater than 10 lbs. May use ice or moist heat for pain. Weight loss is of benefit. Handicap license is approved. MRPlan: Plan: Avoid overhead lifting and overhead use of the arms. Do not lift greater than 10 lbs. Tylenol ES one every 6-8 hours for pain and inflamation. MRI of the right shoulder ordered due to weakness not improved with blocking cuff and  Persistent weakness 10 weeks post fall. EMG/NCV right arm ordered due to numbness and paresthesias and weakness consistent with radiculopathy vs brachial plexopathy.  Avoid bending, stooping and avoid lifting weights greater than 10 lbs. Avoid prolong standing and walking. Avoid frequent bending and stooping  No lifting greater than 10 lbs. May use ice or moist heat for pain. Weight loss is of benefit. Handicap license is approved.I of the cervical spine due to persistent pain in the neck and right arm with finger flexion weakness and pain right arm.

## 2021-05-14 NOTE — Progress Notes (Signed)
Office Visit Note   Patient: Erik Cook           Date of Birth: 07/20/1961           MRN: [id number] Visit Date: 05/14/2021              Requested by: Veverly Fells, MD [address] [place],  [place] [address] PCP: Veverly Fells, MD   Assessment & Plan: Visit Diagnoses:  1. Spondylosis of cervical spine with radiculopathy   2. Radiculopathy, lumbar region   3. Degenerative disc disease, cervical   4. Spinal stenosis of lumbar region with neurogenic claudication   5. Fusion of spine, cervical region   6. Numbness and tingling of right arm   7. Carpal tunnel syndrome of right wrist     Plan: Plan: Avoid overhead lifting and overhead use of the arms. Do not lift greater than 10 lbs. Tylenol ES one every 6-8 hours for pain and inflamation. MRI of the right shoulder ordered due to weakness not improved with blocking cuff and  Persistent weakness 10 weeks post fall. EMG/NCV right arm ordered due to numbness and paresthesias and weakness consistent with radiculopathy vs brachial plexopathy.  Avoid bending, stooping and avoid lifting weights greater than 10 lbs. Avoid prolong standing and walking. Avoid frequent bending and stooping  No lifting greater than 10 lbs. May use ice or moist heat for pain. Weight loss is of benefit. Handicap license is approved.  MRI of the cervical spine for persistent right neck and right arm pain and numbness and tingling.  Follow-Up Instructions: No follow-ups on file.   Orders:  No orders of the defined types were placed in this encounter.  No orders of the defined types were placed in this encounter.     Procedures: No procedures performed   Clinical Data: No additional findings.   Subjective: Chief Complaint  Patient presents with   Neck - Follow-up    60 year old male with history of low back and neck pain. He had injury to his shoulder and this is better. Had surgery on the right shouder and he had pain into the right  neck and radiation into the right arm in to the right long and ring fingers. He is having difficulty with standing and walking and knees are weak with walking up steps has to go up on hands and knees. He feels like the lower back is affecting the legs. He wants to get back to the gym to build himself up. Can see the stars with reaching or lying in bed, has to  Hold his breath to be able to get back to moving.    Review of Systems  Constitutional: Negative.   HENT: Negative.    Eyes: Negative.   Respiratory: Negative.    Cardiovascular: Negative.   Gastrointestinal: Negative.   Endocrine: Negative.   Genitourinary: Negative.   Musculoskeletal: Negative.   Skin: Negative.   Allergic/Immunologic: Negative.   Neurological: Negative.   Hematological: Negative.   Psychiatric/Behavioral: Negative.      Objective: Vital Signs: BP 123/80 (BP Location: Left Arm, Patient Position: Sitting)   Pulse 67   Ht 5\' 9"  (1.753 m)   Wt 210 lb (95.3 kg)   BMI 31.01 kg/m   Physical Exam Constitutional:      Appearance: He is well-developed.  HENT:     Head: Normocephalic and atraumatic.  Eyes:     Pupils: Pupils are equal, round, and reactive  to light.  Pulmonary:     Effort: Pulmonary effort is normal.     Breath sounds: Normal breath sounds.  Abdominal:     General: Bowel sounds are normal.     Palpations: Abdomen is soft.  Musculoskeletal:        General: Normal range of motion.     Cervical back: Normal range of motion and neck supple.  Skin:    General: Skin is warm and dry.  Neurological:     Mental Status: He is alert and oriented to person, place, and time.  Psychiatric:        Behavior: Behavior normal.        Thought Content: Thought content normal.        Judgment: Judgment normal.    Ortho Exam  Specialty Comments:  No specialty comments available.  Imaging: No results found.   PMFS History: Patient Active Problem List   Diagnosis Date Noted   Complete tear of  right rotator cuff    Biceps tendonitis on right    Degenerative superior labral anterior-to-posterior (SLAP) tear of right shoulder    Lumbar radiculopathy 09/28/2016   Past Medical History:  Diagnosis Date   Arthritis    knees, lower back   GERD (gastroesophageal reflux disease)    no current problem - no meds   HLD (hyperlipidemia)    no meds - diet controlled   Hypertension    Sleep apnea    does not use cpap    No family history on file.  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   EYE SURGERY Bilateral    Lasik   KNEE ARTHROSCOPY     left   KNEE ARTHROSCOPY     on left side again   ROTATOR CUFF REPAIR     left   ROTATOR CUFF REPAIR     2nd time around (after fall)   SHOULDER ARTHROSCOPY WITH SUBACROMIAL DECOMPRESSION AND BICEP TENDON REPAIR Right 03/13/2021   Procedure: SHOULDER ARTHROSCOPY WITH SUBACROMIAL DECOMPRESSION, MNIN OPEN ROTATOR CUFF TEAR REPAIR,  BICEPS TENODESIS;  Surgeon: Cammy Copa, MD;  Location: [facility] OR;  Service: Orthopedics;  Laterality: Right;   Social History   Occupational History   Not on file  Tobacco Use   Smoking status: Never   Smokeless tobacco: Never  Vaping Use   Vaping Use: Never used  Substance and Sexual Activity   Alcohol use: No    Comment: occasional beer - twice month   Drug use: No   Sexual activity: Not on file

## 2021-05-24 ENCOUNTER — Ambulatory Visit (INDEPENDENT_AMBULATORY_CARE_PROVIDER_SITE_OTHER): Payer: Medicare HMO

## 2021-05-24 ENCOUNTER — Other Ambulatory Visit: Payer: Self-pay

## 2021-05-24 DIAGNOSIS — M4322 Fusion of spine, cervical region: Secondary | ICD-10-CM

## 2021-05-24 DIAGNOSIS — M4722 Other spondylosis with radiculopathy, cervical region: Secondary | ICD-10-CM

## 2021-05-24 DIAGNOSIS — R202 Paresthesia of skin: Secondary | ICD-10-CM

## 2021-06-05 ENCOUNTER — Encounter: Payer: Self-pay | Admitting: Specialist

## 2021-06-05 ENCOUNTER — Other Ambulatory Visit: Payer: Self-pay

## 2021-06-05 ENCOUNTER — Ambulatory Visit (INDEPENDENT_AMBULATORY_CARE_PROVIDER_SITE_OTHER): Payer: Medicare HMO | Admitting: Specialist

## 2021-06-05 VITALS — BP 151/91 | HR 69 | Ht 69.0 in | Wt 210.0 lb

## 2021-06-05 DIAGNOSIS — M5030 Other cervical disc degeneration, unspecified cervical region: Secondary | ICD-10-CM

## 2021-06-05 DIAGNOSIS — R200 Anesthesia of skin: Secondary | ICD-10-CM

## 2021-06-05 DIAGNOSIS — M4722 Other spondylosis with radiculopathy, cervical region: Secondary | ICD-10-CM

## 2021-06-05 DIAGNOSIS — M4322 Fusion of spine, cervical region: Secondary | ICD-10-CM

## 2021-06-05 DIAGNOSIS — M5416 Radiculopathy, lumbar region: Secondary | ICD-10-CM

## 2021-06-05 DIAGNOSIS — R202 Paresthesia of skin: Secondary | ICD-10-CM

## 2021-06-05 DIAGNOSIS — M48062 Spinal stenosis, lumbar region with neurogenic claudication: Secondary | ICD-10-CM

## 2021-06-05 DIAGNOSIS — M6281 Muscle weakness (generalized): Secondary | ICD-10-CM

## 2021-06-05 MED ORDER — MELOXICAM 15 MG PO TABS: 15.0000 mg | ORAL_TABLET | Freq: Every day | ORAL | 0 refills | Status: DC

## 2021-06-05 NOTE — Patient Instructions (Signed)
Avoid overhead lifting and overhead use of the arms. Do not lift greater than 5 lbs. Adjust head rest in vehicle to prevent hyperextension if rear ended. Take extra precautions to avoid falling, including use of a cane if you feel weak. Scheduling secretary Tivis Ringer. will call you to arrange for surgery for your cervical spine. If you wish a second opinion please let us know and we can arrange for you. If you have worsening arm or leg numbness or weakness please call or go to an ER. We will contact your cardiologist and primary care physicians to seek clearance for your surgery. Surgery will be an right sided foramenotomies at the C5-6 and C6-7 level with decompression of the cervical spinal canal and the right sided nerve opening due to moderate and severe narrowing of these openings.  Risks of surgery include risks of infection, bleeding and risks to the spinal cord and  Risks of sore throat and difficulty swallowing which should  Improve over the next 4-6 weeks following surgery. Surgery is indicated due to upper extremity radiculopathy. In the future surgery at adjacent levels may be necessary but these levels do not appear to be related to your current symptoms or signs.

## 2021-06-05 NOTE — Progress Notes (Signed)
Office Visit Note   Patient: Erik Cook           Date of Birth: 1960-11-03           MRN: [id number] Visit Date: 06/05/2021              Requested by: Veverly Fells, MD [address] [place],  [place] [address] PCP: Veverly Fells, MD   Assessment & Plan: Visit Diagnoses:  1. Fusion of spine, cervical region   2. Spondylosis of cervical spine with radiculopathy   3. Muscle right arm weakness   4. Numbness and tingling of right arm   5. Degenerative disc disease, cervical   6. Spinal stenosis of lumbar region with neurogenic claudication   7. Radiculopathy, lumbar region     Plan: Follow-Avoid overhead lifting and overhead use of the arms. Do not lift greater than 5 lbs. Adjust head rest in vehicle to prevent hyperextension if rear ended. Take extra precautions to avoid falling, including use of a cane if you feel weak. Scheduling secretary Tivis Ringer. will call you to arrange for surgery for your cervical spine. If you wish a second opinion please let us know and we can arrange for you. If you have worsening arm or leg numbness or weakness please call or go to an ER. We will contact your cardiologist and primary care physicians to seek clearance for your surgery. Surgery will be an right sided foramenotomies at the C5-6 and C6-7 level with decompression of the cervical spinal canal and the right sided nerve opening due to moderate and severe narrowing of these openings.  Risks of surgery include risks of infection, bleeding and risks to the spinal cord and  Risks of sore throat and difficulty swallowing which should  Improve over the next 4-6 weeks following surgery. Surgery is indicated due to upper extremity radiculopathy. In the future surgery at adjacent levels may be necessary but these levels do not appear to be related to your current symptoms or signs.Up Instructions: Return in about 4 weeks (around 07/03/2021).   Orders:  No orders of the defined types were  placed in this encounter.  No orders of the defined types were placed in this encounter.     Procedures: No procedures performed   Clinical Data: No additional findings.   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 Now he is post op repair of right shoulder RCT by Dr. August Saucer. Walks with pain and neck pain is worsening 8/10 pain neck left this morning and it is on the right side and today he had the pain into the right arm and left neck pain. The neck pain causes stiffness and discomfort into the arm. No bowel or bladder difficulty.Neck pain has worsened post right shoulder surgery. Weakness right grip and pain and numbness into the right middle fingers and feelings like the right hand is swollen.    Review of  Systems  Constitutional: Negative.   HENT: Negative.    Eyes: Negative.   Respiratory: Negative.    Cardiovascular: Negative.   Gastrointestinal: Negative.   Endocrine: Negative.   Genitourinary: Negative.   Musculoskeletal: Negative.   Skin: Negative.   Allergic/Immunologic: Negative.   Neurological: Negative.   Hematological: Negative.   Psychiatric/Behavioral: Negative.      Objective: Vital Signs: BP (!) 151/91 (BP Location: Left Arm, Patient Position: Sitting)   Pulse 69   Ht 5\' 9"  (1.753 m)   Wt 210 lb (95.3 kg)   BMI 31.01 kg/m   Physical Exam Constitutional:      Appearance: He is well-developed.  HENT:     Head: Normocephalic and atraumatic.  Eyes:     Pupils: Pupils are equal, round, and reactive to light.   Pulmonary:     Effort: Pulmonary effort is normal.     Breath sounds: Normal breath sounds.  Abdominal:     General: Bowel sounds are normal.     Palpations: Abdomen is soft.  Musculoskeletal:        General: Normal range of motion.     Cervical back: Normal range of motion and neck supple.  Skin:    General: Skin is warm and dry.  Neurological:     Mental Status: He is alert and oriented to person, place, and time.  Psychiatric:        Behavior: Behavior normal.        Thought Content: Thought content normal.        Judgment: Judgment normal.    Ortho Exam  Specialty Comments:  No specialty comments available.  Imaging: No results found.   PMFS History: Patient Active Problem List   Diagnosis Date Noted   Complete tear of right rotator cuff    Biceps tendonitis on right    Degenerative superior labral anterior-to-posterior (SLAP) tear of right shoulder    Lumbar radiculopathy 09/28/2016   Past Medical History:  Diagnosis Date   Arthritis    knees, lower back   GERD (gastroesophageal reflux disease)    no current problem - no meds   HLD (hyperlipidemia)    no meds - diet controlled   Hypertension    Sleep apnea    does not use cpap    No family history on file.  Past Surgical History:  Procedure Laterality Date   ANTERIOR CERVICAL DECOMP/DISCECTOMY FUSION N/A 10/05/2012   Procedure: ANTERIOR CERVICAL DECOMPRESSION/DISCECTOMY FUSION 1 LEVEL;  Surgeon: 12/05/2012, MD;  Location: [facility] OR;  Service: Orthopedics;  Laterality: N/A;  TOTAL DISC REPLACEMENT VERSES ACDF C4-5   EYE SURGERY Bilateral    Lasik   KNEE ARTHROSCOPY     left   KNEE ARTHROSCOPY     on left side again   ROTATOR CUFF REPAIR     left   ROTATOR CUFF REPAIR     2nd time around (after fall)   SHOULDER ARTHROSCOPY WITH SUBACROMIAL DECOMPRESSION AND BICEP TENDON REPAIR Right 03/13/2021   Procedure: SHOULDER ARTHROSCOPY WITH SUBACROMIAL DECOMPRESSION, MNIN OPEN ROTATOR CUFF TEAR REPAIR,   BICEPS TENODESIS;  Surgeon: 05/13/2021, MD;  Location: [facility] OR;  Service: Orthopedics;  Laterality: Right;   Social History   Occupational History   Not on file  Tobacco Use   Smoking status: Never   Smokeless tobacco: Never  Vaping Use   Vaping Use: Never used  Substance and Sexual Activity   Alcohol use: No    Comment: occasional beer - twice  month   Drug use: No   Sexual activity: Not on file

## 2021-06-05 NOTE — Addendum Note (Signed)
Addended by: [name] on: 06/05/2021 10:21 AM   Modules accepted: Orders

## 2021-06-18 ENCOUNTER — Ambulatory Visit: Payer: Medicare HMO | Admitting: Orthopedic Surgery

## 2021-06-18 ENCOUNTER — Ambulatory Visit: Payer: Medicare HMO | Admitting: Surgical

## 2021-07-01 ENCOUNTER — Encounter: Payer: Self-pay | Admitting: Specialist

## 2021-07-04 ENCOUNTER — Ambulatory Visit: Payer: Medicare HMO | Admitting: Surgical

## 2021-07-07 ENCOUNTER — Other Ambulatory Visit: Payer: Self-pay

## 2021-07-07 ENCOUNTER — Encounter: Payer: Self-pay | Admitting: Orthopedic Surgery

## 2021-07-07 ENCOUNTER — Ambulatory Visit (INDEPENDENT_AMBULATORY_CARE_PROVIDER_SITE_OTHER): Payer: Medicare HMO | Admitting: Orthopedic Surgery

## 2021-07-07 DIAGNOSIS — M75121 Complete rotator cuff tear or rupture of right shoulder, not specified as traumatic: Secondary | ICD-10-CM

## 2021-07-07 NOTE — Progress Notes (Signed)
Post-Op Visit Note   Patient: Erik Cook           Date of Birth: August 27, 1960           MRN: [id number] Visit Date: 07/07/2021 PCP: Veverly Fells, MD   Assessment & Plan:  Chief Complaint:  Chief Complaint  Patient presents with   Right Shoulder - Routine Post Op    right shoulder arthroscopy with mini open rotator cuff tear repair biceps tenodesis performed 03/13/2021.       Visit Diagnoses:  1. Complete tear of right rotator cuff, unspecified whether traumatic     Plan: Erik Cook is a 60 year old patient underwent right shoulder arthroscopy mini open rotator cuff tear repair 03/13/2021.  About 3 and half months out.  Has neck surgery scheduled with Dr. Otelia Sergeant 1220.  Still having some right radicular symptoms going below the elbow but overall his shoulder has improved significantly.  He is doing isometrics at the gym.  Is looking at stem cell treatment at the beginning of the year.  On examination he has passive range of motion of 50/90/160.  No coarse grinding or crepitus at 90 degrees of AB duction with passive range of motion.  Strength is actually pretty good only half.  Less with external rotation on the right compared to the left.  Plan is to be fairly mindful doing any type of heavy workout routine.  Encouraged him to avoid free weights.  He is going to try the stem cell therapy sometime in January for his other body aches.  Follow-up with [place] as needed.  Follow-Up Instructions: No follow-ups on file.   Orders:  No orders of the defined types were placed in this encounter.  No orders of the defined types were placed in this encounter.   Imaging: No results found.  PMFS History: Patient Active Problem List   Diagnosis Date Noted   Complete tear of right rotator cuff    Biceps tendonitis on right    Degenerative superior labral anterior-to-posterior (SLAP) tear of right shoulder    Lumbar radiculopathy 09/28/2016   Past Medical History:  Diagnosis Date   Arthritis     knees, lower back   GERD (gastroesophageal reflux disease)    no current problem - no meds   HLD (hyperlipidemia)    no meds - diet controlled   Hypertension    Sleep apnea    does not use cpap    No family history on file.  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   EYE SURGERY Bilateral    Lasik   KNEE ARTHROSCOPY     left   KNEE ARTHROSCOPY     on left side again   ROTATOR CUFF REPAIR     left   ROTATOR CUFF REPAIR     2nd time around (after fall)   SHOULDER ARTHROSCOPY WITH SUBACROMIAL DECOMPRESSION AND BICEP TENDON REPAIR Right 03/13/2021   Procedure: SHOULDER ARTHROSCOPY WITH SUBACROMIAL DECOMPRESSION, MNIN OPEN ROTATOR CUFF TEAR REPAIR,  BICEPS TENODESIS;  Surgeon: Cammy Copa, MD;  Location: [facility] OR;  Service: Orthopedics;  Laterality: Right;   Social History   Occupational History   Not on file  Tobacco Use   Smoking status: Never   Smokeless tobacco: Never  Vaping Use  Vaping Use: Never used  Substance and Sexual Activity   Alcohol use: No    Comment: occasional beer - twice month   Drug use: No   Sexual activity: Not on file

## 2021-07-16 ENCOUNTER — Encounter: Payer: Self-pay | Admitting: Surgery

## 2021-07-16 ENCOUNTER — Other Ambulatory Visit: Payer: Self-pay

## 2021-07-16 ENCOUNTER — Ambulatory Visit (INDEPENDENT_AMBULATORY_CARE_PROVIDER_SITE_OTHER): Payer: Medicare HMO | Admitting: Surgery

## 2021-07-16 VITALS — BP 130/84 | HR 82

## 2021-07-16 DIAGNOSIS — M4722 Other spondylosis with radiculopathy, cervical region: Secondary | ICD-10-CM

## 2021-07-17 NOTE — Pre-Procedure Instructions (Signed)
Surgical Instructions    Your procedure is scheduled on Tuesday, July 22, 2021 at 7:30 AM.  Report to [facility] Main Entrance "A" at 5:30 A.M., then check in with the Admitting office.  Call this number if you have problems the morning of surgery:  [phone]   If you have any questions prior to your surgery date call [phone]: Open Monday-Friday 8am-4pm    Remember:  Do not eat after midnight the night before your surgery  You may drink clear liquids until 4:30 the morning of your surgery.   Clear liquids allowed are: Water, Non-Citrus Juices (without pulp), Carbonated Beverages, Clear Tea, Black Coffee Only, and Gatorade    Take these medicines the morning of surgery with A SIP OF WATER:  pregabalin (LYRICA) baclofen (LIORESAL) - if needed  As of today, STOP taking any Aspirin (unless otherwise instructed by your surgeon) Aleve, Naproxen, Ibuprofen, Motrin, Advil, meloxicam (MOBIC), Goody's, BC's, all herbal medications, fish oil, and all vitamins.                     Do NOT Smoke (Tobacco/Vaping) or drink Alcohol 24 hours prior to your procedure.  If you use a CPAP at night, you may bring all equipment for your overnight stay.   Contacts, glasses, piercing's, hearing aid's, dentures or partials may not be worn into surgery, please bring cases for these belongings.    For patients admitted to the hospital, discharge time will be determined by your treatment team.   Patients discharged the day of surgery will not be allowed to drive home, and someone needs to stay with them for 24 hours.  NO VISITORS WILL BE ALLOWED IN PRE-OP WHERE PATIENTS GET READY FOR SURGERY.  ONLY 1 SUPPORT PERSON MAY BE PRESENT IN THE WAITING ROOM WHILE YOU ARE IN SURGERY.  IF YOU ARE TO BE ADMITTED, ONCE YOU ARE IN YOUR ROOM YOU WILL BE ALLOWED TWO (2) VISITORS.  Minor children may have two parents present. Special consideration for safety and communication needs will be reviewed on a case by  case basis.   Special instructions:   [facility]- Preparing For Surgery  Before surgery, you can play an important role. Because skin is not sterile, your skin needs to be as free of germs as possible. You can reduce the number of germs on your skin by washing with CHG (chlorahexidine gluconate) Soap before surgery.  CHG is an antiseptic cleaner which kills germs and bonds with the skin to continue killing germs even after washing.    Oral Hygiene is also important to reduce your risk of infection.  Remember - BRUSH YOUR TEETH THE MORNING OF SURGERY WITH YOUR REGULAR TOOTHPASTE  Please do not use if you have an allergy to CHG or antibacterial soaps. If your skin becomes reddened/irritated stop using the CHG.  Do not shave (including legs and underarms) for at least 48 hours prior to first CHG shower. It is OK to shave your face.  Please follow these instructions carefully.   Shower the NIGHT BEFORE SURGERY and the MORNING OF SURGERY  If you chose to wash your hair, wash your hair first as usual with your normal shampoo.  After you shampoo, rinse your hair and body thoroughly to remove the shampoo.  Use CHG Soap as you would any other liquid soap. You can apply CHG directly to the skin and wash gently with a scrungie or a clean washcloth.   Apply the CHG Soap to your body  ONLY FROM THE NECK DOWN.  Do not use on open wounds or open sores. Avoid contact with your eyes, ears, mouth and genitals (private parts). Wash Face and genitals (private parts)  with your normal soap.   Wash thoroughly, paying special attention to the area where your surgery will be performed.  Thoroughly rinse your body with warm water from the neck down.  DO NOT shower/wash with your normal soap after using and rinsing off the CHG Soap.  Pat yourself dry with a CLEAN TOWEL.  Wear CLEAN PAJAMAS to bed the night before surgery  Place CLEAN SHEETS on your bed the night before your surgery  DO NOT SLEEP WITH  PETS.   Day of Surgery: Shower with CHG soap. Do not wear jewelry. Do not wear lotions, powders, colognes, or deodorant. Men may shave face and neck. Do not bring valuables to the hospital. [facility] is not responsible for any belongings or valuables. Wear Clean/Comfortable clothing the morning of surgery Remember to brush your teeth WITH YOUR REGULAR TOOTHPASTE.   Please read over the following fact sheets that you were given.   3 days prior to your procedure or After your COVID test   You are not required to quarantine however you are required to wear a well-fitting mask when you are out and around people not in your household. If your mask becomes wet or soiled, replace with a new one.   Wash your hands often with soap and water for 20 seconds or clean your hands with an alcohol-based hand sanitizer that contains at least 60% alcohol.   Do not share personal items.   Notify your provider:  o if you are in close contact with someone who has COVID  o or if you develop a fever of 100.4 or greater, sneezing, cough, sore throat, shortness of breath or body aches.

## 2021-07-18 ENCOUNTER — Encounter (HOSPITAL_COMMUNITY): Payer: Self-pay

## 2021-07-18 ENCOUNTER — Encounter (HOSPITAL_COMMUNITY)
Admission: RE | Admit: 2021-07-18 | Discharge: 2021-07-18 | Disposition: A | Discharge status: Home / Self Care | Payer: Medicare HMO | Source: Ambulatory Visit | Attending: Specialist | Admitting: Specialist

## 2021-07-18 ENCOUNTER — Other Ambulatory Visit: Payer: Self-pay

## 2021-07-18 VITALS — BP 124/96 | HR 76 | Temp 97.7°F | Resp 17 | Ht 69.0 in | Wt 220.8 lb

## 2021-07-18 DIAGNOSIS — Z20822 Contact with and (suspected) exposure to covid-19: Secondary | ICD-10-CM | POA: Insufficient documentation

## 2021-07-18 DIAGNOSIS — Z01812 Encounter for preprocedural laboratory examination: Secondary | ICD-10-CM | POA: Insufficient documentation

## 2021-07-18 DIAGNOSIS — Z01818 Encounter for other preprocedural examination: Secondary | ICD-10-CM

## 2021-07-18 DIAGNOSIS — I2510 Atherosclerotic heart disease of native coronary artery without angina pectoris: Secondary | ICD-10-CM

## 2021-07-18 HISTORY — DX: Other complications of anesthesia, initial encounter: T88.59XA

## 2021-07-18 LAB — CBC
HCT: 52.1 % — ABNORMAL HIGH (ref 39.0–52.0)
Hemoglobin: 17.3 g/dL — ABNORMAL HIGH (ref 13.0–17.0)
MCH: 30.8 pg (ref 26.0–34.0)
MCHC: 33.2 g/dL (ref 30.0–36.0)
MCV: 92.7 fL (ref 80.0–100.0)
Platelets: 170 10*3/uL (ref 150–400)
RBC: 5.62 MIL/uL (ref 4.22–5.81)
RDW: 13.2 % (ref 11.5–15.5)
WBC: 8.3 10*3/uL (ref 4.0–10.5)
nRBC: 0 % (ref 0.0–0.2)

## 2021-07-18 LAB — BASIC METABOLIC PANEL
Anion gap: 6 (ref 5–15)
BUN: 21 mg/dL (ref 8–23)
CO2: 31 mmol/L (ref 22–32)
Calcium: 9.6 mg/dL (ref 8.9–10.3)
Chloride: 99 mmol/L (ref 98–111)
Creatinine, Ser: 1.12 mg/dL (ref 0.61–1.24)
GFR, Estimated: >60 mL/min (ref >60)
Glucose, Bld: 106 mg/dL — ABNORMAL HIGH (ref 70–99)
Potassium: 4.3 mmol/L (ref 3.5–5.1)
Sodium: 136 mmol/L (ref 135–145)

## 2021-07-18 LAB — SURGICAL PCR SCREEN
MRSA, PCR: NEGATIVE
Staphylococcus aureus: POSITIVE — AB

## 2021-07-18 LAB — SARS CORONAVIRUS 2 (TAT 6-24 HRS): SARS Coronavirus 2: NEGATIVE

## 2021-07-18 NOTE — Progress Notes (Signed)
PCP - Dr. Lindajo Royal Cardiologist - Denies  PPM/ICD - Denies  Chest x-ray - N/A EKG - 03/05/21 Stress Test - Denies ECHO - Denies Cardiac Cath - Denies  Sleep Study - Denies  Diabetic: Denies  Blood Thinner Instructions: N/A Aspirin Instructions: N/A  ERAS Protcol - Yes PRE-SURGERY Ensure or G2- No  COVID TEST- 07/18/21 in PAT   Anesthesia review: No  Patient denies shortness of breath, fever, cough and chest pain at PAT appointment   All instructions explained to the patient, with a verbal understanding of the material. Patient agrees to go over the instructions while at home for a better understanding. Patient also instructed to self quarantine after being tested for COVID-19. The opportunity to ask questions was provided.

## 2021-07-21 NOTE — H&P (Signed)
Erik Cook is an 60 y.o. male.   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  Symptoms unchanged from previous visit.  He is wanting to proceed with right C5-6 and right C6-7 foraminotomies as scheduled.  History and physical performed.  Review of systems negative.  Patient has failed conservative treatment.  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 Social History:  reports that he has never smoked. He has never used smokeless tobacco. He reports that he does not drink alcohol and does not use drugs.  Allergies: No Known Allergies  No medications prior to admission.    No results found for this or any previous visit (from the past 48 hour(s)). No results found.  Review  of Systems  Constitutional:  Positive for activity change.  HENT: Negative.    Respiratory: Negative.    Cardiovascular: Negative.   Gastrointestinal: Negative.   Genitourinary: Negative.   Musculoskeletal:  Positive for neck pain and neck stiffness.  Neurological:  Positive for numbness.  Hematological: Negative.   Psychiatric/Behavioral: Negative.     There were no vitals taken for this visit. Physical Exam HENT:     Head: Normocephalic and atraumatic.     Nose: Nose normal.  Eyes:     Extraocular Movements: Extraocular movements intact.  Cardiovascular:     Rate and Rhythm: Regular rhythm.     Heart sounds: Normal heart sounds.  Pulmonary:     Effort: Pulmonary effort is normal. No respiratory distress.     Breath sounds: Normal breath sounds.  Musculoskeletal:        General: Tenderness present.  Neurological:     Mental Status: He is alert and oriented to person, place, and time.  Psychiatric:        Mood and Affect: Mood normal.     Assessment/Plan Right C5-6 and right C6-7 [name]/stenosis  We will proceed with right C5-6 and C6-7 foraminotomies as scheduled.  Surgical procedure discussed along with potential rehab/recovery time.  All questions answered.  Erik Kief, PA-C 07/21/2021, 10:04 PM

## 2021-07-21 NOTE — Anesthesia Preprocedure Evaluation (Addendum)
Anesthesia Evaluation  Patient identified by MRN, date of birth, ID band Patient awake    Reviewed: Allergy & Precautions, NPO status , Patient's Chart, lab work & pertinent test results  History of Anesthesia Complications (+) PROLONGED EMERGENCE and history of anesthetic complications  Airway Mallampati: IV  TM Distance: >3 FB Neck ROM: Full    Dental  (+) Teeth Intact, Dental Advisory Given   Pulmonary sleep apnea ,    Pulmonary exam normal breath sounds clear to auscultation       Cardiovascular hypertension, Pt. on medications Normal cardiovascular exam Rhythm:Regular Rate:Normal     Neuro/Psych Cervical Spondylosis Right C5-C6, and Right C6-C7  Neuromuscular disease    GI/Hepatic Neg liver ROS, GERD  ,  Endo/Other  Obesity   Renal/GU negative Renal ROS     Musculoskeletal  (+) Arthritis ,   Abdominal   Peds  Hematology negative hematology ROS (+)   Anesthesia Other Findings   Reproductive/Obstetrics                            Anesthesia Physical Anesthesia Plan  ASA: 2  Anesthesia Plan: General   Post-op Pain Management: Tylenol PO (pre-op)   Induction: Intravenous  PONV Risk Score and Plan: 2 and Midazolam, Dexamethasone and Ondansetron  Airway Management Planned: Oral ETT and Video Laryngoscope Planned  Additional Equipment:   Intra-op Plan:   Post-operative Plan: Extubation in OR  Informed Consent: I have reviewed the patients History and Physical, chart, labs and discussed the procedure including the risks, benefits and alternatives for the proposed anesthesia with the patient or authorized representative who has indicated his/her understanding and acceptance.     Dental advisory given  Plan Discussed with: CRNA  Anesthesia Plan Comments:        Anesthesia Quick Evaluation

## 2021-07-22 ENCOUNTER — Ambulatory Visit (HOSPITAL_COMMUNITY): Payer: Medicare HMO

## 2021-07-22 ENCOUNTER — Ambulatory Visit (HOSPITAL_COMMUNITY): Payer: Medicare HMO | Admitting: Vascular Surgery

## 2021-07-22 ENCOUNTER — Observation Stay (HOSPITAL_COMMUNITY)
Admission: RE | Admit: 2021-07-22 | Discharge: 2021-07-23 | Disposition: A | Discharge status: Home / Self Care | Payer: Medicare HMO | Attending: Specialist | Admitting: Specialist

## 2021-07-22 ENCOUNTER — Ambulatory Visit (HOSPITAL_COMMUNITY)
Admission: RE | Disposition: A | Discharge status: Home / Self Care | Payer: Self-pay | Source: Home / Self Care | Attending: Specialist

## 2021-07-22 ENCOUNTER — Encounter (HOSPITAL_COMMUNITY): Payer: Self-pay | Admitting: Specialist

## 2021-07-22 ENCOUNTER — Ambulatory Visit (HOSPITAL_COMMUNITY): Payer: Medicare HMO | Admitting: Anesthesiology

## 2021-07-22 ENCOUNTER — Other Ambulatory Visit: Payer: Self-pay

## 2021-07-22 DIAGNOSIS — Z79899 Other long term (current) drug therapy: Secondary | ICD-10-CM | POA: Insufficient documentation

## 2021-07-22 DIAGNOSIS — I10 Essential (primary) hypertension: Secondary | ICD-10-CM | POA: Diagnosis not present

## 2021-07-22 DIAGNOSIS — Z419 Encounter for procedure for purposes other than remedying health state, unspecified: Secondary | ICD-10-CM

## 2021-07-22 DIAGNOSIS — M4722 Other spondylosis with radiculopathy, cervical region: Principal | ICD-10-CM | POA: Insufficient documentation

## 2021-07-22 DIAGNOSIS — Z01818 Encounter for other preprocedural examination: Secondary | ICD-10-CM

## 2021-07-22 DIAGNOSIS — G4730 Sleep apnea, unspecified: Secondary | ICD-10-CM | POA: Diagnosis not present

## 2021-07-22 HISTORY — PX: POSTERIOR CERVICAL FUSION/FORAMINOTOMY: SHX5038

## 2021-07-22 LAB — COMPREHENSIVE METABOLIC PANEL
ALT: 48 U/L — ABNORMAL HIGH (ref 0–44)
AST: 35 U/L (ref 15–41)
Albumin: 4.4 g/dL (ref 3.5–5.0)
Alkaline Phosphatase: 59 U/L (ref 38–126)
Anion gap: 9 (ref 5–15)
BUN: 25 mg/dL — ABNORMAL HIGH (ref 8–23)
CO2: 28 mmol/L (ref 22–32)
Calcium: 9.6 mg/dL (ref 8.9–10.3)
Chloride: 98 mmol/L (ref 98–111)
Creatinine, Ser: 1.28 mg/dL — ABNORMAL HIGH (ref 0.61–1.24)
GFR, Estimated: >60 mL/min (ref >60)
Glucose, Bld: 97 mg/dL (ref 70–99)
Potassium: 3.8 mmol/L (ref 3.5–5.1)
Sodium: 135 mmol/L (ref 135–145)
Total Bilirubin: 1 mg/dL (ref 0.3–1.2)
Total Protein: 7.7 g/dL (ref 6.5–8.1)

## 2021-07-22 SURGERY — POSTERIOR CERVICAL FUSION/FORAMINOTOMY LEVEL 2
Anesthesia: General

## 2021-07-22 MED ORDER — DEXAMETHASONE SODIUM PHOSPHATE 4 MG/ML IJ SOLN: 4.0000 mg | Freq: Four times a day (QID) | INTRAMUSCULAR | Status: AC

## 2021-07-22 MED ORDER — FENTANYL CITRATE (PF) 100 MCG/2ML IJ SOLN: 25.0000 ug | INTRAMUSCULAR | Status: DC | PRN

## 2021-07-22 MED ORDER — MENTHOL 3 MG MT LOZG: 1.0000 | LOZENGE | OROMUCOSAL | Status: DC | PRN

## 2021-07-22 MED ORDER — SODIUM CHLORIDE 0.9 % IV SOLN: INTRAVENOUS | Status: DC

## 2021-07-22 MED ORDER — ACETAMINOPHEN 650 MG RE SUPP: 650.0000 mg | RECTAL | Status: DC | PRN

## 2021-07-22 MED ORDER — 0.9 % SODIUM CHLORIDE (POUR BTL) OPTIME
TOPICAL | Status: DC | PRN
  Administered 2021-07-22: 09:00:00 1000 mL

## 2021-07-22 MED ORDER — CEFAZOLIN SODIUM-DEXTROSE 2-3 GM-%(50ML) IV SOLR
INTRAVENOUS | Status: DC | PRN
  Administered 2021-07-22: 2 g via INTRAVENOUS

## 2021-07-22 MED ORDER — THROMBIN 20000 UNITS EX SOLR
CUTANEOUS | Status: DC | PRN
  Administered 2021-07-22: 09:00:00 20000 [IU] via TOPICAL

## 2021-07-22 MED ORDER — FENTANYL CITRATE (PF) 250 MCG/5ML IJ SOLN
INTRAMUSCULAR | Status: AC
  Filled 2021-07-22: qty 5

## 2021-07-22 MED ORDER — MORPHINE SULFATE (PF) 2 MG/ML IV SOLN
1.0000 mg | INTRAVENOUS | Status: DC | PRN
  Administered 2021-07-22: 15:00:00 1 mg via INTRAVENOUS
  Filled 2021-07-22: qty 1

## 2021-07-22 MED ORDER — KETAMINE HCL 50 MG/5ML IJ SOSY
PREFILLED_SYRINGE | INTRAMUSCULAR | Status: AC
  Filled 2021-07-22: qty 10

## 2021-07-22 MED ORDER — BUPIVACAINE LIPOSOME 1.3 % IJ SUSP
10.0000 mL | Freq: Once | INTRAMUSCULAR | Status: DC
  Filled 2021-07-22: qty 10

## 2021-07-22 MED ORDER — BACLOFEN 10 MG PO TABS: 10.0000 mg | ORAL_TABLET | Freq: Three times a day (TID) | ORAL | Status: DC

## 2021-07-22 MED ORDER — METHOCARBAMOL 500 MG PO TABS
500.0000 mg | ORAL_TABLET | Freq: Four times a day (QID) | ORAL | Status: DC | PRN
  Administered 2021-07-23: 500 mg via ORAL
  Filled 2021-07-22: qty 1

## 2021-07-22 MED ORDER — DEXAMETHASONE SODIUM PHOSPHATE 4 MG/ML IJ SOLN
INTRAMUSCULAR | Status: DC | PRN
  Administered 2021-07-22: 10 mg via INTRAVENOUS

## 2021-07-22 MED ORDER — EPHEDRINE SULFATE-NACL 50-0.9 MG/10ML-% IV SOSY
PREFILLED_SYRINGE | INTRAVENOUS | Status: DC | PRN
  Administered 2021-07-22 (×2): 10 mg via INTRAVENOUS

## 2021-07-22 MED ORDER — PHENOL 1.4 % MT LIQD: 1.0000 | OROMUCOSAL | Status: DC | PRN

## 2021-07-22 MED ORDER — ACETAMINOPHEN 500 MG PO TABS: 1000.0000 mg | ORAL_TABLET | Freq: Once | ORAL | Status: AC

## 2021-07-22 MED ORDER — VASOPRESSIN 20 UNIT/ML IV SOLN
INTRAVENOUS | Status: DC | PRN
  Administered 2021-07-22 (×4): 2 [IU] via INTRAVENOUS

## 2021-07-22 MED ORDER — SUCCINYLCHOLINE CHLORIDE 200 MG/10ML IV SOSY
PREFILLED_SYRINGE | INTRAVENOUS | Status: DC | PRN
Start: 2021-07-22 — End: 2021-07-22
  Administered 2021-07-22: 100 mg via INTRAVENOUS

## 2021-07-22 MED ORDER — MIDAZOLAM HCL 2 MG/2ML IJ SOLN
INTRAMUSCULAR | Status: AC
  Filled 2021-07-22: qty 2

## 2021-07-22 MED ORDER — PHENYLEPHRINE HCL (PRESSORS) 10 MG/ML IV SOLN
INTRAVENOUS | Status: DC | PRN
  Administered 2021-07-22 (×2): 160 ug via INTRAVENOUS
  Administered 2021-07-22: 200 ug via INTRAVENOUS

## 2021-07-22 MED ORDER — PHENYLEPHRINE HCL-NACL 20-0.9 MG/250ML-% IV SOLN
INTRAVENOUS | Status: DC | PRN
  Administered 2021-07-22: 50 ug/min via INTRAVENOUS

## 2021-07-22 MED ORDER — BUPIVACAINE HCL (PF) 0.25 % IJ SOLN
INTRAMUSCULAR | Status: DC | PRN
  Administered 2021-07-22: 20 mL

## 2021-07-22 MED ORDER — ORAL CARE MOUTH RINSE: 15.0000 mL | Freq: Once | OROMUCOSAL | Status: AC

## 2021-07-22 MED ORDER — DOCUSATE SODIUM 100 MG PO CAPS
100.0000 mg | ORAL_CAPSULE | Freq: Two times a day (BID) | ORAL | Status: DC
  Administered 2021-07-22 – 2021-07-23 (×2): 100 mg via ORAL
  Filled 2021-07-22 (×2): qty 1

## 2021-07-22 MED ORDER — LACTATED RINGERS IV SOLN: INTRAVENOUS | Status: DC | PRN

## 2021-07-22 MED ORDER — PREGABALIN 75 MG PO CAPS
75.0000 mg | ORAL_CAPSULE | Freq: Two times a day (BID) | ORAL | Status: DC
  Administered 2021-07-22 – 2021-07-23 (×3): 75 mg via ORAL
  Filled 2021-07-22 (×3): qty 1

## 2021-07-22 MED ORDER — ROCURONIUM BROMIDE 10 MG/ML (PF) SYRINGE
PREFILLED_SYRINGE | INTRAVENOUS | Status: AC
  Filled 2021-07-22: qty 10

## 2021-07-22 MED ORDER — SODIUM CHLORIDE 0.9 % IV SOLN: 250.0000 mL | INTRAVENOUS | Status: DC

## 2021-07-22 MED ORDER — PROMETHAZINE HCL 25 MG/ML IJ SOLN
6.2500 mg | INTRAMUSCULAR | Status: AC | PRN
  Administered 2021-07-22 (×2): 6.25 mg via INTRAVENOUS

## 2021-07-22 MED ORDER — CEFAZOLIN SODIUM-DEXTROSE 2-4 GM/100ML-% IV SOLN
2.0000 g | INTRAVENOUS | Status: DC
  Filled 2021-07-22: qty 100

## 2021-07-22 MED ORDER — SODIUM CHLORIDE 0.9% FLUSH: 3.0000 mL | Freq: Two times a day (BID) | INTRAVENOUS | Status: DC

## 2021-07-22 MED ORDER — DEXAMETHASONE 4 MG PO TABS
4.0000 mg | ORAL_TABLET | Freq: Four times a day (QID) | ORAL | Status: AC
  Administered 2021-07-22 – 2021-07-23 (×4): 4 mg via ORAL
  Filled 2021-07-22 (×4): qty 1

## 2021-07-22 MED ORDER — BUPIVACAINE HCL (PF) 0.25 % IJ SOLN
INTRAMUSCULAR | Status: AC
  Filled 2021-07-22: qty 30

## 2021-07-22 MED ORDER — OXYCODONE HCL ER 10 MG PO T12A
10.0000 mg | EXTENDED_RELEASE_TABLET | Freq: Two times a day (BID) | ORAL | Status: DC
  Administered 2021-07-22 – 2021-07-23 (×3): 10 mg via ORAL
  Filled 2021-07-22 (×3): qty 1

## 2021-07-22 MED ORDER — MELOXICAM 7.5 MG PO TABS: 15.0000 mg | ORAL_TABLET | Freq: Every day | ORAL | Status: DC

## 2021-07-22 MED ORDER — OXYCODONE HCL 5 MG PO TABS: 5.0000 mg | ORAL_TABLET | ORAL | Status: DC | PRN

## 2021-07-22 MED ORDER — ACETAMINOPHEN 500 MG PO TABS
ORAL_TABLET | ORAL | Status: AC
  Administered 2021-07-22: 06:00:00 1000 mg via ORAL
  Filled 2021-07-22: qty 2

## 2021-07-22 MED ORDER — ONDANSETRON HCL 4 MG/2ML IJ SOLN
INTRAMUSCULAR | Status: AC
  Filled 2021-07-22: qty 2

## 2021-07-22 MED ORDER — PANTOPRAZOLE SODIUM 40 MG IV SOLR
40.0000 mg | Freq: Every day | INTRAVENOUS | Status: DC
  Administered 2021-07-22: 21:00:00 40 mg via INTRAVENOUS
  Filled 2021-07-22: qty 40

## 2021-07-22 MED ORDER — OMEGA-3-ACID ETHYL ESTERS 1 G PO CAPS
1000.0000 mg | ORAL_CAPSULE | Freq: Every day | ORAL | Status: DC
  Administered 2021-07-23: 09:00:00 1000 mg via ORAL
  Filled 2021-07-22: qty 1

## 2021-07-22 MED ORDER — DEXAMETHASONE SODIUM PHOSPHATE 10 MG/ML IJ SOLN
INTRAMUSCULAR | Status: AC
  Filled 2021-07-22: qty 1

## 2021-07-22 MED ORDER — LISINOPRIL-HYDROCHLOROTHIAZIDE 20-25 MG PO TABS: 1.0000 | ORAL_TABLET | Freq: Every day | ORAL | Status: DC

## 2021-07-22 MED ORDER — SUGAMMADEX SODIUM 200 MG/2ML IV SOLN
INTRAVENOUS | Status: DC | PRN
  Administered 2021-07-22: 400 mg via INTRAVENOUS

## 2021-07-22 MED ORDER — FENTANYL CITRATE (PF) 250 MCG/5ML IJ SOLN
INTRAMUSCULAR | Status: DC | PRN
  Administered 2021-07-22: 50 ug via INTRAVENOUS
  Administered 2021-07-22: 100 ug via INTRAVENOUS

## 2021-07-22 MED ORDER — FLEET ENEMA 7-19 GM/118ML RE ENEM: 1.0000 | ENEMA | Freq: Once | RECTAL | Status: DC | PRN

## 2021-07-22 MED ORDER — LACTATED RINGERS IV SOLN: INTRAVENOUS | Status: DC

## 2021-07-22 MED ORDER — HYDROCHLOROTHIAZIDE 25 MG PO TABS
25.0000 mg | ORAL_TABLET | Freq: Every day | ORAL | Status: DC
  Administered 2021-07-22: 21:00:00 25 mg via ORAL
  Filled 2021-07-22: qty 1

## 2021-07-22 MED ORDER — ONDANSETRON HCL 4 MG/2ML IJ SOLN
INTRAMUSCULAR | Status: DC | PRN
  Administered 2021-07-22 (×2): 4 mg via INTRAVENOUS

## 2021-07-22 MED ORDER — POLYETHYLENE GLYCOL 3350 17 G PO PACK: 17.0000 g | PACK | Freq: Every day | ORAL | Status: DC | PRN

## 2021-07-22 MED ORDER — EPHEDRINE 5 MG/ML INJ
INTRAVENOUS | Status: AC
  Filled 2021-07-22: qty 5

## 2021-07-22 MED ORDER — ROCURONIUM 10MG/ML (10ML) SYRINGE FOR MEDFUSION PUMP - OPTIME
INTRAVENOUS | Status: DC | PRN
  Administered 2021-07-22 (×2): 50 mg via INTRAVENOUS

## 2021-07-22 MED ORDER — THROMBIN 20000 UNITS EX KIT
PACK | CUTANEOUS | Status: AC
  Filled 2021-07-22: qty 1

## 2021-07-22 MED ORDER — KETOROLAC TROMETHAMINE 15 MG/ML IJ SOLN
15.0000 mg | Freq: Four times a day (QID) | INTRAMUSCULAR | Status: AC
  Administered 2021-07-22 – 2021-07-23 (×4): 15 mg via INTRAVENOUS
  Filled 2021-07-22 (×4): qty 1

## 2021-07-22 MED ORDER — BUPIVACAINE LIPOSOME 1.3 % IJ SUSP
INTRAMUSCULAR | Status: DC | PRN
  Administered 2021-07-22: 20 mL

## 2021-07-22 MED ORDER — PROMETHAZINE HCL 25 MG/ML IJ SOLN
INTRAMUSCULAR | Status: AC
  Filled 2021-07-22: qty 1

## 2021-07-22 MED ORDER — ONDANSETRON HCL 4 MG/2ML IJ SOLN
4.0000 mg | Freq: Four times a day (QID) | INTRAMUSCULAR | Status: DC | PRN
  Administered 2021-07-22: 22:00:00 4 mg via INTRAVENOUS
  Filled 2021-07-22: qty 2

## 2021-07-22 MED ORDER — KETAMINE HCL 10 MG/ML IJ SOLN
INTRAMUSCULAR | Status: DC | PRN
  Administered 2021-07-22: 50 mg via INTRAVENOUS

## 2021-07-22 MED ORDER — PHENYLEPHRINE 40 MCG/ML (10ML) SYRINGE FOR IV PUSH (FOR BLOOD PRESSURE SUPPORT)
PREFILLED_SYRINGE | INTRAVENOUS | Status: AC
  Filled 2021-07-22: qty 20

## 2021-07-22 MED ORDER — METHOCARBAMOL 1000 MG/10ML IJ SOLN
500.0000 mg | Freq: Four times a day (QID) | INTRAVENOUS | Status: DC | PRN
  Administered 2021-07-22: 18:00:00 500 mg via INTRAVENOUS
  Filled 2021-07-22: qty 5

## 2021-07-22 MED ORDER — HEMOSTATIC AGENTS (NO CHARGE) OPTIME
TOPICAL | Status: DC | PRN
  Administered 2021-07-22: 1 via TOPICAL

## 2021-07-22 MED ORDER — ADULT MULTIVITAMIN W/MINERALS CH
1.0000 | ORAL_TABLET | Freq: Every day | ORAL | Status: DC
  Administered 2021-07-23: 09:00:00 1 via ORAL
  Filled 2021-07-22: qty 1

## 2021-07-22 MED ORDER — ALUM & MAG HYDROXIDE-SIMETH 200-200-20 MG/5ML PO SUSP: 30.0000 mL | Freq: Four times a day (QID) | ORAL | Status: DC | PRN

## 2021-07-22 MED ORDER — ACETAMINOPHEN 325 MG PO TABS: 650.0000 mg | ORAL_TABLET | ORAL | Status: DC | PRN

## 2021-07-22 MED ORDER — HYDROCODONE-ACETAMINOPHEN 5-325 MG PO TABS
2.0000 | ORAL_TABLET | ORAL | Status: DC | PRN
  Administered 2021-07-22 – 2021-07-23 (×3): 2 via ORAL
  Filled 2021-07-22 (×3): qty 2

## 2021-07-22 MED ORDER — VITAMIN D 25 MCG (1000 UNIT) PO TABS
1000.0000 [IU] | ORAL_TABLET | Freq: Every day | ORAL | Status: DC
  Administered 2021-07-23: 09:00:00 1000 [IU] via ORAL
  Filled 2021-07-22: qty 1

## 2021-07-22 MED ORDER — CEFAZOLIN SODIUM-DEXTROSE 2-4 GM/100ML-% IV SOLN
2.0000 g | Freq: Three times a day (TID) | INTRAVENOUS | Status: AC
  Administered 2021-07-22 – 2021-07-23 (×2): 2 g via INTRAVENOUS
  Filled 2021-07-22 (×2): qty 100

## 2021-07-22 MED ORDER — SODIUM CHLORIDE 0.9% FLUSH: 3.0000 mL | INTRAVENOUS | Status: DC | PRN

## 2021-07-22 MED ORDER — PROPOFOL 10 MG/ML IV BOLUS
INTRAVENOUS | Status: DC | PRN
  Administered 2021-07-22: 15 mg via INTRAVENOUS

## 2021-07-22 MED ORDER — FENTANYL CITRATE (PF) 100 MCG/2ML IJ SOLN
INTRAMUSCULAR | Status: AC
  Filled 2021-07-22: qty 2

## 2021-07-22 MED ORDER — VASOPRESSIN 20 UNIT/ML IV SOLN
INTRAVENOUS | Status: AC
  Filled 2021-07-22: qty 1

## 2021-07-22 MED ORDER — CHLORHEXIDINE GLUCONATE 0.12 % MT SOLN
15.0000 mL | Freq: Once | OROMUCOSAL | Status: AC
  Administered 2021-07-22: 06:00:00 15 mL via OROMUCOSAL
  Filled 2021-07-22: qty 15

## 2021-07-22 MED ORDER — SUCCINYLCHOLINE CHLORIDE 200 MG/10ML IV SOSY
PREFILLED_SYRINGE | INTRAVENOUS | Status: AC
  Filled 2021-07-22: qty 10

## 2021-07-22 MED ORDER — BISACODYL 5 MG PO TBEC: 5.0000 mg | DELAYED_RELEASE_TABLET | Freq: Every day | ORAL | Status: DC | PRN

## 2021-07-22 MED ORDER — LISINOPRIL 20 MG PO TABS
20.0000 mg | ORAL_TABLET | Freq: Every day | ORAL | Status: DC
  Administered 2021-07-22: 21:00:00 20 mg via ORAL
  Filled 2021-07-22: qty 1

## 2021-07-22 MED ORDER — HYDROMORPHONE HCL 1 MG/ML IJ SOLN
0.5000 mg | INTRAMUSCULAR | Status: DC | PRN
  Administered 2021-07-22 (×2): 1 mg via INTRAVENOUS
  Filled 2021-07-22 (×2): qty 1

## 2021-07-22 MED ORDER — ONDANSETRON HCL 4 MG PO TABS: 4.0000 mg | ORAL_TABLET | Freq: Four times a day (QID) | ORAL | Status: DC | PRN

## 2021-07-22 MED ORDER — PROPOFOL 10 MG/ML IV BOLUS
INTRAVENOUS | Status: AC
  Filled 2021-07-22: qty 20

## 2021-07-22 MED ORDER — LIDOCAINE 2% (20 MG/ML) 5 ML SYRINGE
INTRAMUSCULAR | Status: DC | PRN
  Administered 2021-07-22: 100 mg via INTRAVENOUS

## 2021-07-22 MED ORDER — BUPIVACAINE LIPOSOME 1.3 % IJ SUSP
INTRAMUSCULAR | Status: AC
  Filled 2021-07-22: qty 20

## 2021-07-22 MED ORDER — LIDOCAINE 2% (20 MG/ML) 5 ML SYRINGE
INTRAMUSCULAR | Status: AC
  Filled 2021-07-22: qty 5

## 2021-07-22 SURGICAL SUPPLY — 60 items
BAG COUNTER SPONGE SURGICOUNT (BAG) ×2 IMPLANT
BAG SURGICOUNT SPONGE COUNTING (BAG) ×1
BIT DRILL NEURO 2X3.1 SFT TUCH (MISCELLANEOUS) ×1 IMPLANT
BLADE CLIPPER SURG (BLADE) IMPLANT
BUR RND FLUTED 2.5 (BURR) IMPLANT
COLLAR CERV LO CONTOUR FIRM DE (SOFTGOODS) ×3 IMPLANT
COVER SURGICAL LIGHT HANDLE (MISCELLANEOUS) ×3 IMPLANT
DERMABOND ADVANCED (GAUZE/BANDAGES/DRESSINGS) ×2
DERMABOND ADVANCED .7 DNX12 (GAUZE/BANDAGES/DRESSINGS) ×1 IMPLANT
DRAPE C-ARM 42X72 X-RAY (DRAPES) ×3 IMPLANT
DRAPE HALF SHEET 40X57 (DRAPES) ×6 IMPLANT
DRAPE LAPAROTOMY 100X72 PEDS (DRAPES) ×3 IMPLANT
DRAPE MICROSCOPE LEICA (MISCELLANEOUS) IMPLANT
DRAPE SURG 17X23 STRL (DRAPES) ×12 IMPLANT
DRILL NEURO 2X3.1 SOFT TOUCH (MISCELLANEOUS) ×3
DRSG MEPILEX BORDER 4X4 (GAUZE/BANDAGES/DRESSINGS) IMPLANT
DRSG MEPILEX BORDER 4X8 (GAUZE/BANDAGES/DRESSINGS) IMPLANT
DRSG XEROFORM 1X8 (GAUZE/BANDAGES/DRESSINGS) ×2 IMPLANT
DURAPREP 6ML APPLICATOR 50/CS (WOUND CARE) ×3 IMPLANT
DURASEAL SPINE SEALANT 3ML (MISCELLANEOUS) ×2 IMPLANT
ELECT CAUTERY BLADE 6.4 (BLADE) ×3 IMPLANT
ELECT REM PT RETURN 9FT ADLT (ELECTROSURGICAL) ×3
ELECTRODE REM PT RTRN 9FT ADLT (ELECTROSURGICAL) ×1 IMPLANT
EVACUATOR 1/8 PVC DRAIN (DRAIN) IMPLANT
GAUZE SPONGE 4X4 12PLY STRL (GAUZE/BANDAGES/DRESSINGS) ×3 IMPLANT
GAUZE SPONGE 4X4 12PLY STRL LF (GAUZE/BANDAGES/DRESSINGS) ×2 IMPLANT
GLOVE SRG 8 PF TXTR STRL LF DI (GLOVE) ×1 IMPLANT
GLOVE SURG 8.5 LATEX PF (GLOVE) ×3 IMPLANT
GLOVE SURG LTX SZ9 (GLOVE) ×3 IMPLANT
GLOVE SURG ORTHO LTX SZ7.5 (GLOVE) ×3 IMPLANT
GLOVE SURG UNDER POLY LF SZ8 (GLOVE) ×2
GOWN STRL REUS W/ TWL LRG LVL3 (GOWN DISPOSABLE) ×1 IMPLANT
GOWN STRL REUS W/TWL 2XL LVL3 (GOWN DISPOSABLE) ×6 IMPLANT
GOWN STRL REUS W/TWL LRG LVL3 (GOWN DISPOSABLE) ×2
GRAFT DURAGEN MATRIX 1WX1L (Tissue) ×2 IMPLANT
KIT BASIN OR (CUSTOM PROCEDURE TRAY) ×3 IMPLANT
KIT TURNOVER KIT B (KITS) ×3 IMPLANT
MANIFOLD NEPTUNE II (INSTRUMENTS) ×3 IMPLANT
NDL SPNL 18GX3.5 QUINCKE PK (NEEDLE) ×1 IMPLANT
NEEDLE SPNL 18GX3.5 QUINCKE PK (NEEDLE) ×3 IMPLANT
NS IRRIG 1000ML POUR BTL (IV SOLUTION) ×3 IMPLANT
PACK ORTHO CERVICAL (CUSTOM PROCEDURE TRAY) ×3 IMPLANT
PAD ARMBOARD 7.5X6 YLW CONV (MISCELLANEOUS) ×6 IMPLANT
PATTIES SURGICAL .25X.25 (GAUZE/BANDAGES/DRESSINGS) ×3 IMPLANT
PATTIES SURGICAL .75X.75 (GAUZE/BANDAGES/DRESSINGS) IMPLANT
SPONGE SURGIFOAM ABS GEL 100 (HEMOSTASIS) IMPLANT
SPONGE T-LAP 4X18 ~~LOC~~+RFID (SPONGE) ×2 IMPLANT
SUT ETHIBOND CT1 BRD #0 30IN (SUTURE) IMPLANT
SUT VIC AB 0 CT1 27 (SUTURE)
SUT VIC AB 0 CT1 27XBRD ANBCTR (SUTURE) IMPLANT
SUT VIC AB 2-0 CT1 27 (SUTURE)
SUT VIC AB 2-0 CT1 TAPERPNT 27 (SUTURE) IMPLANT
SUT VIC AB 2-0 UR6 27 (SUTURE) IMPLANT
SUT VIC AB 3-0 X1 27 (SUTURE) ×3 IMPLANT
SUT VICRYL 0 UR6 27IN ABS (SUTURE) ×3 IMPLANT
TAPE CLOTH SURG 4X10 WHT LF (GAUZE/BANDAGES/DRESSINGS) ×2 IMPLANT
TOWEL GREEN STERILE (TOWEL DISPOSABLE) ×3 IMPLANT
TOWEL GREEN STERILE FF (TOWEL DISPOSABLE) ×3 IMPLANT
TRAY FOLEY MTR SLVR 16FR STAT (SET/KITS/TRAYS/PACK) IMPLANT
WATER STERILE IRR 1000ML POUR (IV SOLUTION) ×3 IMPLANT

## 2021-07-22 NOTE — Transfer of Care (Signed)
Immediate Anesthesia Transfer of Care Note  Patient: Traevon Meiring  Procedure(s) Performed: Right C5-6 and Right C6--7 Foraminotomies  Patient Location: PACU  Anesthesia Type:General  Level of Consciousness: awake, alert  and oriented  Airway & Oxygen Therapy: Patient Spontanous Breathing and Patient connected to face mask oxygen  Post-op Assessment: Report given to RN and Post -op Vital signs reviewed and stable  Post vital signs: Reviewed and stable  Last Vitals:  Vitals Value Taken Time  BP 150/82 07/22/21 1223  Temp 36.1 C 07/22/21 1110  Pulse 88 07/22/21 1231  Resp 19 07/22/21 1231  SpO2 94 % 07/22/21 1231  Vitals shown include unvalidated device data.  Last Pain:  Vitals:   07/22/21 1140  TempSrc:   PainSc: Asleep      Patients Stated Pain Goal: 0 (07/22/21 4734)  Complications: No notable events documented.

## 2021-07-22 NOTE — Anesthesia Procedure Notes (Signed)
Procedure Name: Intubation Date/Time: 07/22/2021 7:59 AM Performed by: Ervin Knack, CRNA Pre-anesthesia Checklist: Patient identified, Emergency Drugs available, Suction available and Patient being monitored Patient Re-evaluated:Patient Re-evaluated prior to induction Oxygen Delivery Method: Circle system utilized Preoxygenation: Pre-oxygenation with 100% oxygen Induction Type: IV induction Laryngoscope Size: Glidescope and 4 Grade View: Grade I Tube type: Oral Tube size: 7.5 mm Number of attempts: 1 Airway Equipment and Method: Stylet Placement Confirmation: ETT inserted through vocal cords under direct vision, positive ETCO2 and breath sounds checked- equal and bilateral Tube secured with: Tape Dental Injury: Teeth and Oropharynx as per pre-operative assessment

## 2021-07-22 NOTE — Discharge Instructions (Addendum)
° ° °  No lifting greater than 10 lbs. Avoid bending, stooping and twisting. Walking in house for first week then may start to get out slowly increasing activity using arms. Keep incision dry for 3 days, may use tegaderm or similar water impervious dressing. Avoid overhead use of arms and overhead lifting. Wear collar for comfort. Use ice as needed for comfort.  Use a recliner of bed with a wedge to allow elevation of the head and neck To decrease pressure in the cerebrospinal fluid and decrease risk of a reopening of the dural tear that occurred during surgery. For 2 more days  Try to remain at a reduced lifting and exercise level then gradually increase  Standing and walking daily to improve strength and endurance.  Call if any drainage or concern or recurrence of HAs or photophobia (difficulty with light bothering vision).

## 2021-07-22 NOTE — Interval H&P Note (Signed)
History and Physical Interval Note:  07/22/2021 7:44 AM  Erik Cook  has presented today for surgery, with the diagnosis of Cervical Spondylosis Right C5-C6, and Right C6-C7.  The various methods of treatment have been discussed with the patient and family. After consideration of risks, benefits and other options for treatment, the patient has consented to  Procedure(s): Right C5-6 and Right C6--7 Foraminotomies (N/A) as a surgical intervention.  The patient's history has been reviewed, patient examined, no change in status, stable for surgery.  I have reviewed the patient's chart and labs.  Questions were answered to the patient's satisfaction.     [name]

## 2021-07-22 NOTE — Progress Notes (Signed)
Orthopedic Tech Progress Note Patient Details:  Alik Mawson Apr 11, 1961 [id number]  [facility] stated "patient has SOFT COLLAR"   Patient ID: Erik Cook, male   DOB: Feb 01, 1961, 60 y.o.   MRN: [id number]  Donald Pore 07/22/2021, 1:52 PM

## 2021-07-22 NOTE — OR Nursing (Signed)
Dr. Margo Aye called to state that the tip of the hemostat is closest to C5. Dr. Otelia Sergeant made aware.

## 2021-07-22 NOTE — Brief Op Note (Signed)
07/22/2021  10:53 AM  PATIENT:  Erik Cook  60 y.o. male  PRE-OPERATIVE DIAGNOSIS:  Cervical Spondylosis Right C5-C6, and Right C6-C7  POST-OPERATIVE DIAGNOSIS:  Cervical Spondylosis Right C5-C6, and Right C6-C7  PROCEDURE:  Procedure(s): Right C5-6 and Right C6--7 Foraminotomies (N/A)  SURGEON:  Surgeon(s) and Role:    * Kerrin Champagne, MD - Primary  PHYSICIAN ASSISTANT: Zonia Kief, PA-C  ANESTHESIA:   local and general  EBL:  350 mL   BLOOD ADMINISTERED:none  COMPLICATION: 87mm dural tear, duragen and duraseal dry at end of case. HOB Elevated for 24 hours, may stand and walk with PT.   DRAINS: Urinary Catheter (Foley)  In and out at the end of the case.  LOCAL MEDICATIONS USED:  MARCAINE    and Amount: 10CC ml  SPECIMEN:  No Specimen  DISPOSITION OF SPECIMEN:  N/A  COUNTS:  YES  TOURNIQUET:  * No tourniquets in log *  DICTATION: .Dragon Dictation  PLAN OF CARE: Admit for overnight observation  PATIENT DISPOSITION:  PACU - hemodynamically stable.   Delay start of Pharmacological VTE agent (>24hrs) due to surgical blood loss or risk of bleeding: yes

## 2021-07-22 NOTE — Anesthesia Postprocedure Evaluation (Signed)
Anesthesia Post Note  Patient: Erik Cook  Procedure(s) Performed: Right C5-6 and Right C6--7 Foraminotomies     Patient location during evaluation: PACU Anesthesia Type: General Level of consciousness: awake and alert Pain management: pain level controlled Vital Signs Assessment: post-procedure vital signs reviewed and stable Respiratory status: spontaneous breathing, nonlabored ventilation and respiratory function stable Cardiovascular status: blood pressure returned to baseline and stable Postop Assessment: no apparent nausea or vomiting Anesthetic complications: no   No notable events documented.  Last Vitals:  Vitals:   07/22/21 1347 07/22/21 1758  BP: (!) 141/86 (!) 143/93  Pulse: 94 84  Resp: 16 16  Temp:  36.4 C  SpO2: 98% 94%    Last Pain:  Vitals:   07/22/21 1803  TempSrc:   PainSc: 7                  Cecile Hearing

## 2021-07-22 NOTE — Op Note (Signed)
07/22/2021  10:57 AM  PATIENT:  Erik Cook  60 y.o. male  MRN: [id number]  OPERATIVE REPORT  PRE-OPERATIVE DIAGNOSIS:  Cervical Spondylosis Right C5-C6, and Right C6-C7  POST-OPERATIVE DIAGNOSIS:  Cervical Spondylosis Right C5-C6, and Right C6-C7  PROCEDURE:  Procedure(s): Right C5-6 and Right C6--7 Foraminotomies    SURGEON:  Jessy Oto, MD     ASSISTANT:  Benjiman Core, PA-C  (Present throughout the entire procedure and necessary for completion of procedure in a timely manner)     ANESTHESIA:  General, supplemented with local marcaine  0.5% 1:1 exparel 1.3% total 10 cc at the onset of surgery and with incision of the skin.     COMPLICATIONS:  Right medial facet dural tear less than 66m repaired with duragen and duraseal patch,     PROCEDURE:The patient was met in the holding area, and the appropriate cervical right C5-6 and C6-7 levels identified and marked with "x" and my initials. All questions were answered and informed consent signed. The patient was then transported to OR. The patient was then placed under general anesthesia without difficulty and transferred to the operating room table prone position [place] horseshoe with chest rolls. All pressure points well-padded PAS stockings.. The patient received appropriate preoperative antibiotic prophylaxis.  Sterile prep with DuraPrep and draped in the usual manner the shoulders were taped downwards and skin traction over the skin of the neck. Following DuraPrep draped in the usual manner. After timeout protocol, the skin incision was made at the area just superior to the spinous process prominence felt to represent C7, the subcutaneous layers incised in the midline and the incision down to the spinous processes. A cross table lateral radiograph showed the clap directed at the posterior elements of C5-6 with the C5 spinous process superior to the clamp. Incision was made approximately1 1/2 inches in length from C5-6 superiorly to C7  in the midline. This following infiltration of skin and subcutaneous layers with local marcaine 0.5% 1:1 exparel 1.3% total of 10 cc. Incision carried through skin and subcutaneous layers using 10 blade scalpel and electrocautery down to the level ligamentum nuchae. Incision made centered on the spinous process of C5-C7  a clamp then placed at the spinous process of C5 intraoperative lateral radiograghs identified the clamps at the C5 level.  The C5 spinous process marked with a surgical marker. Electrocautery then used to carefully incise the cervical muscles off the right lateral aspect of the spinous process of C5, C6 and C7. Carefully removing spinous muscles off of the inferior aspect lamina at C5, C6 and superior half of C7 exposing the C5-6 through C6-7posterior interlaminar spaces. Loupe magnification headlamp were used during this portion procedure. The Operating Room Microscope then sterilely draped and brought into the field. Boss McCollough retractor was inserted. High-speed bur was used to remove a small portion of bone from the inferior aspect of lamina of C5 and C6, and the medial 20% of the inferior articular process of C5 and C6. Further thinning the superior aspect of the lamina of C6 and C7. A 1 mm Kerrison was then used to remove ligamentum flavum from superior aspect of the lamina C6 and C7 and the medial aspect of the inferior articular process of C5 and C6 approximately 20% exposing the superior articular process of C6 and C7. A 1 mm Kerrison was used to remove bone off the superior aspect of the lamina of C6, and C7and then resecting 20% of the medial aspect of the superior articular process  of C6 and C7. Ligamentum flavum then easily lifted and electrocautery used to cauterize epidural veins deep to the ligamentum flavum at C5-6 and C6-7and the fibrovascular leash overlying the C6 and C7 nerve roots were resected. The operating room microscope was draped sterilely and brought into the field.  Under the operating room microscope the epidural vein layer overlying the posterior aspect of the thecal sac and the C6 and C7 nerve roots were then carefully lifted using a micro-titanium were cauterized using bipolar electrocautery and divided overlying the C6 and C7 nerve roots releasing the vascular leash a forward angle 3-0 microcurette then used to remove a small portion of bone off the superior and medial aspect of the pedicle further mobilizing the C6 and C7 nerve roots bipolar electrocautery used to control all bleeding within the axillary area and C6  and C7 nerve. Surgiflow, thrombin soaked gel foam and bone wax was applied to bleeding cancellus bone surfaces are excellent hemostasis obtained. The C6 and C7 nerve roots were tracked superiorly and laterally with a nerve hook. Posterior mass effect was felt to represent uncovertebral spur at each of the 2 levels. Following this then hemostasis was obtained using thrombin-soaked Gelfoam and micro-pledgettes. When complete hemostasis was obtained all trial was removed I nerve hook could be easily passed out the neuroforamen without the lateral aspect of the C6 and C7 pedicles demonstrating the C6  and C7 neuroforamen to be completely decompressed. The C5-6 and C6-7 foraminotomies demonstrated well decompressed C6 and C7 nerve roots. A less than 1 mm dural tear occurred at the medial aspect of the right C5-6 facet while using bur to drill the inferior articular process of C5. This was treated with a small 86mx3mm duragen patch and duraseal fibrin glue and demonstrated no leak at the end of the procedure.  Irrigation was carried out no active bleeding was present. Following further irrigation and the incision was closed by approximating the ligamentum nuchae with 0 vicryl sutures. The subcutaneous layers approximated with interrupted 0 Vicryl suture more superficial layers with interrupted 2-0 Vicryl sutures and the skin closed with interrupted 4-0 Vicryl  sutures and stainless steel staples,then [employer] bandage. Soft cervical collar all instrument and sponge counts were correct. Patient was then returned to supine position on her stretcher. Returned to recovery room in satisfactory condition.  Physician assistant's responsibilities: JBenjiman CorePA-C perform the duties of assistant physician and surgeon during this case present from the beginning of the case to the end of the case. He assisted with careful retraction of neural structures suctioning about her elements including C6 and C7 nerve roots. Performed closure of the incision on the ligamentum nuchae to the skin and application of dressing. He assisted in positioning the patient had removal the patient from the OR table to her stretcher.          JBasil Dess 07/22/2021, 10:57 AM

## 2021-07-23 ENCOUNTER — Other Ambulatory Visit (HOSPITAL_COMMUNITY): Payer: Self-pay

## 2021-07-23 ENCOUNTER — Encounter (HOSPITAL_COMMUNITY): Payer: Self-pay | Admitting: Specialist

## 2021-07-23 DIAGNOSIS — M4722 Other spondylosis with radiculopathy, cervical region: Secondary | ICD-10-CM | POA: Diagnosis not present

## 2021-07-23 LAB — CBC
HCT: 43.8 % (ref 39.0–52.0)
Hemoglobin: 14.5 g/dL (ref 13.0–17.0)
MCH: 30.7 pg (ref 26.0–34.0)
MCHC: 33.1 g/dL (ref 30.0–36.0)
MCV: 92.6 fL (ref 80.0–100.0)
Platelets: 171 10*3/uL (ref 150–400)
RBC: 4.73 MIL/uL (ref 4.22–5.81)
RDW: 13.5 % (ref 11.5–15.5)
WBC: 21.7 10*3/uL — ABNORMAL HIGH (ref 4.0–10.5)
nRBC: 0 % (ref 0.0–0.2)

## 2021-07-23 MED ORDER — OXYCODONE HCL ER 10 MG PO T12A
10.0000 mg | EXTENDED_RELEASE_TABLET | Freq: Two times a day (BID) | ORAL | 0 refills | Status: DC
  Filled 2021-07-23: qty 14, 7d supply, fill #0

## 2021-07-23 MED ORDER — OXYCODONE HCL 5 MG PO TABS
5.0000 mg | ORAL_TABLET | ORAL | 0 refills | Status: DC | PRN
  Filled 2021-07-23: qty 40, 7d supply, fill #0

## 2021-07-23 MED ORDER — ONDANSETRON HCL 4 MG PO TABS
4.0000 mg | ORAL_TABLET | Freq: Four times a day (QID) | ORAL | 0 refills | Status: DC | PRN
  Filled 2021-07-23: qty 20, 5d supply, fill #0

## 2021-07-23 MED ORDER — METHOCARBAMOL 500 MG PO TABS
500.0000 mg | ORAL_TABLET | Freq: Four times a day (QID) | ORAL | 1 refills | Status: DC | PRN
  Filled 2021-07-23: qty 30, 8d supply, fill #0

## 2021-07-23 MED FILL — Thrombin For Soln Kit 20000 Unit: CUTANEOUS | Qty: 1 | Status: AC

## 2021-07-23 NOTE — Plan of Care (Signed)
Problem: Education: Goal: Ability to verbalize activity precautions or restrictions will improve Outcome: Completed/Met Goal: Knowledge of the prescribed therapeutic regimen will improve Outcome: Completed/Met Goal: Understanding of discharge needs will improve Outcome: Completed/Met   Problem: Activity: Goal: Ability to avoid complications of mobility impairment will improve Outcome: Completed/Met Goal: Ability to tolerate increased activity will improve Outcome: Completed/Met Goal: Will remain free from falls Outcome: Completed/Met   Problem: Bowel/Gastric: Goal: Gastrointestinal status for postoperative course will improve Outcome: Completed/Met   Problem: Clinical Measurements: Goal: Ability to maintain clinical measurements within normal limits will improve Outcome: Completed/Met Goal: Postoperative complications will be avoided or minimized Outcome: Completed/Met Goal: Diagnostic test results will improve Outcome: Completed/Met   Problem: Pain Management: Goal: Pain level will decrease Outcome: Completed/Met   Problem: Skin Integrity: Goal: Will show signs of wound healing Outcome: Completed/Met   Problem: Health Behavior/Discharge Planning: Goal: Identification of resources available to assist in meeting health care needs will improve Outcome: Completed/Met   Problem: Bladder/Genitourinary: Goal: Urinary functional status for postoperative course will improve Outcome: Completed/Met   Problem: Safety: Goal: Ability to remain free from injury will improve Outcome: Completed/Met   

## 2021-07-23 NOTE — Progress Notes (Signed)
Patient ID: Erik Cook, male   DOB: 1961/01/14, 60 y.o.   MRN: [id number] Patient experienced severe HAs and nausea while taking morphine IV and switch to hydromorphone was able to relieve HAs and lessen nausea. This suggests he may have an intolerance of morphine sulfate and surggests that providing a extended release form of Morphine may not be without significant problems with nausea. I recommend continuing the Oxycodone ER medication for one week then use IR form and gradually wean away from IR over 6 weeks.

## 2021-07-23 NOTE — Care Management Obs Status (Signed)
MEDICARE OBSERVATION STATUS NOTIFICATION   Patient Details  Name: Erik Cook MRN: [id number] Date of Birth: 1960/08/30   Medicare Observation Status Notification Given:  Yes    Beckie Busing, RN 07/23/2021, 11:39 AM

## 2021-07-23 NOTE — Evaluation (Signed)
Physical Therapy Evaluation and Discharge Patient Details Name: Erik Cook MRN: [id number] DOB: 05-01-1961 Today's Date: 07/23/2021  History of Present Illness  Erik Cook  60 y.o. male who underwent right C5-C7 Foraminotomies 07/22/21. PMH significant for HTN, prior ACDF 2014, L knee arthroscopy x2, L RCR x2, R shoulder arthroscopy with subacromial decompression and bicep tendon repair.   Clinical Impression  Patient evaluated by Physical Therapy with no further acute PT needs identified. All education has been completed and the patient has no further questions. Pt was able to demonstrate transfers and ambulation with gross modified independence and no AD. Pt was educated on precautions, brace application/wearing schedule, appropriate activity progression, and car transfer. See below for any follow-up Physical Therapy or equipment needs. PT is signing off. Thank you for this referral.        Recommendations for follow up therapy are one component of a multi-disciplinary discharge planning process, led by the attending physician.  Recommendations may be updated based on patient status, additional functional criteria and insurance authorization.  Follow Up Recommendations No PT follow up    Assistance Recommended at Discharge PRN  Functional Status Assessment Patient has had a recent decline in their functional status and demonstrates the ability to make significant improvements in function in a reasonable and predictable amount of time.  Equipment Recommendations  None recommended by PT    Recommendations for Other Services       Precautions / Restrictions Precautions Precautions: Fall;Cervical Precaution Booklet Issued: Yes (comment) Precaution Comments: Reviewed handout and pt was cued for precautions during functional mobility. Required Braces or Orthoses: Cervical Brace Cervical Brace: Soft collar;At all times (off for bathing, sleeping and trips to the  bathoom) Restrictions Weight Bearing Restrictions: No      Mobility  Bed Mobility Overal bed mobility: Needs Assistance Bed Mobility: Rolling;Sidelying to Sit;Sit to Sidelying Rolling: Supervision Sidelying to sit: Supervision     Sit to sidelying: Supervision General bed mobility comments: Pt able to perform good log roll without assist however supervision provided for optimal technique.    Transfers Overall transfer level: Modified independent Equipment used: Rolling walker (2 wheels) Transfers: Sit to/from Stand Sit to Stand: Min guard           General transfer comment: No assist required. Pt was able to power up to full stand without difficulty and no unsteadiness or LOB noted.    Ambulation/Gait Ambulation/Gait assistance: Modified independent (Device/Increase time) Gait Distance (Feet): 560 Feet Assistive device: None Gait Pattern/deviations: Step-through pattern;Decreased stride length;Wide base of support Gait velocity: Decreased Gait velocity interpretation: 1.31 - 2.62 ft/sec, indicative of limited community ambulator   General Gait Details: Good posture however wide BOS 2 baseline knee pain. No unsteadiness or LOB noted.  Stairs            Wheelchair Mobility    Modified Rankin (Stroke Patients Only)       Balance Overall balance assessment: Needs assistance Sitting-balance support: Feet supported Sitting balance-Leahy Scale: Good     Standing balance support: No upper extremity supported;During functional activity Standing balance-Leahy Scale: Fair                               Pertinent Vitals/Pain Pain Assessment: Faces Faces Pain Scale: Hurts a little bit Pain Location: Incision site Pain Descriptors / Indicators: Operative site guarding Pain Intervention(s): Limited activity within patient's tolerance;Monitored during session;Repositioned    Home Living Family/patient expects to be  discharged to:: Private  residence Living Arrangements: Alone Available Help at Discharge: Family;Available PRN/intermittently (Pt's son is home for the holidays, but does not stay with pt) Type of Home: House Home Access: Stairs to enter   Entrance Stairs-Number of Steps: 4 Alternate Level Stairs-Number of Steps: flight Home Layout: Two level;Bed/bath upstairs Home Equipment: Rolling Walker (2 wheels);Cane - single point      Prior Function Prior Level of Function : Independent/Modified Independent             Mobility Comments: no AD ADLs Comments: indep     Hand Dominance   Dominant Hand: Right    Extremity/Trunk Assessment   Upper Extremity Assessment Upper Extremity Assessment: Defer to OT evaluation    Lower Extremity Assessment Lower Extremity Assessment: Overall WFL for tasks assessed    Cervical / Trunk Assessment Cervical / Trunk Assessment: Neck Surgery  Communication   Communication: No difficulties  Cognition Arousal/Alertness: Awake/alert Behavior During Therapy: WFL for tasks assessed/performed Overall Cognitive Status: Within Functional Limits for tasks assessed                                 General Comments: recalled neck precautions        General Comments General comments (skin integrity, edema, etc.): VSS on RA    Exercises     Assessment/Plan    PT Assessment Patient does not need any further PT services  PT Problem List         PT Treatment Interventions      PT Goals (Current goals can be found in the Care Plan section)  Acute Rehab PT Goals Patient Stated Goal: Home today, back ASAP for lumbar surgery PT Goal Formulation: All assessment and education complete, DC therapy    Frequency     Barriers to discharge        Co-evaluation               AM-PAC PT "6 Clicks" Mobility  Outcome Measure Help needed turning from your back to your side while in a flat bed without using bedrails?: None Help needed moving from lying  on your back to sitting on the side of a flat bed without using bedrails?: None Help needed moving to and from a bed to a chair (including a wheelchair)?: None Help needed standing up from a chair using your arms (e.g., wheelchair or bedside chair)?: None Help needed to walk in hospital room?: None Help needed climbing 3-5 steps with a railing? : None 6 Click Score: 24    End of Session Equipment Utilized During Treatment: Gait belt Activity Tolerance: Patient tolerated treatment well Patient left: in bed;with call bell/phone within reach Nurse Communication: Mobility status PT Visit Diagnosis: Unsteadiness on feet (R26.81);Pain Pain - part of body:  (neck)    Time: 1856-3149 PT Time Calculation (min) (ACUTE ONLY): 12 min   Charges:   PT Evaluation $PT Eval Low Complexity: 1 Low          Conni Slipper, PT, DPT Acute Rehabilitation Services Pager: [phone] Office: [phone]   Marylynn Pearson 07/23/2021, 9:52 AM

## 2021-07-23 NOTE — Progress Notes (Signed)
Patient awaiting transport for discharge home; in no acute distress nor complaints of pain nor discomfort; incision on his posterior neck with gauze dressing and is clean, dry and intact with soft collar on; room was checked and accounted for all his belongings; discharge instructions concerning his medications, incision care, follow up appointment and when to call the doctor as needed were all dicussed with patient by RN and he verbalized understanding on the instructions given.

## 2021-07-23 NOTE — Evaluation (Addendum)
Occupational Therapy Evaluation Patient Details Name: Erik Cook MRN: [id number] DOB: 1961-01-02 Today's Date: 07/23/2021   History of Present Illness Erik Cook  60 y.o. male who underwent right C5-6 and Right C6--7 Foraminotomies 12/20. PMHx: arthritis,GERD, HLD, HTN, sleep apnea   Clinical Impression   Erik Cook was indep PTA. He lives alone in a 2 level home, however he is planning to move in 10 days. Pt verbalized understanding of cervical precautions and demonstrated good ability to complete ADLs while adhering to precautions. Educated on use of sock aide and reacher to increase safety and indep for LB dressing. Overall pt was min guard for functional mobility and up to min A for ADLs. Pt declines use of RW due to it causing him to "trip several times." He does not have further acute OT needs. Recommend d/c home with assist initially for mobility and ADLs.    Recommendations for follow up therapy are one component of a multi-disciplinary discharge planning process, led by the attending physician.  Recommendations may be updated based on patient status, additional functional criteria and insurance authorization.   Follow Up Recommendations  No OT follow up    Assistance Recommended at Discharge Intermittent Supervision/Assistance  Functional Status Assessment  Patient has had a recent decline in their functional status and demonstrates the ability to make significant improvements in function in a reasonable and predictable amount of time.  Equipment Recommendations  None recommended by OT       Precautions / Restrictions Precautions Precautions: Fall;Cervical Required Braces or Orthoses: Cervical Brace Cervical Brace: Soft collar;At all times (off for bathing, sleeping and trips to the bathoom) Restrictions Weight Bearing Restrictions: No      Mobility Bed Mobility Overal bed mobility: Needs Assistance Bed Mobility: Rolling;Sidelying to Sit Rolling:  Supervision Sidelying to sit: Supervision       General bed mobility comments: demo'd great ability to log roll    Transfers Overall transfer level: Needs assistance Equipment used: Rolling walker (2 wheels) Transfers: Sit to/from Stand Sit to Stand: Min guard           General transfer comment: with and without AD, pt reported he felt more steady wtih RW. But also stated that the RW caused him to trip "several times" after previous sx's      Balance Overall balance assessment: Needs assistance Sitting-balance support: Feet supported Sitting balance-Leahy Scale: Good     Standing balance support: No upper extremity supported;During functional activity Standing balance-Leahy Scale: Fair                             ADL either performed or assessed with clinical judgement   ADL Overall ADL's : Needs assistance/impaired Eating/Feeding: Independent;Sitting   Grooming: Oral care;Supervision/safety;Cueing for compensatory techniques;Standing Grooming Details (indicate cue type and reason): educated on 2 cups technique Upper Body Bathing: Supervision/ safety;Sitting   Lower Body Bathing: Minimal assistance;Sit to/from stand;Cueing for back precautions;Cueing for compensatory techniques   Upper Body Dressing : Set up;Sitting   Lower Body Dressing: Minimal assistance;Sit to/from stand;Cueing for compensatory techniques Lower Body Dressing Details (indicate cue type and reason): educated on sock aide and Dealer Transfer: Min guard;Ambulation;Regular Teacher, adult education Details (indicate cue type and reason): without AD Toileting- Clothing Manipulation and Hygiene: Supervision/safety;Sitting/lateral lean       Functional mobility during ADLs: Min guard General ADL Comments: reviewed compensatory techniques for ADLs, pt demonstrated good ability to complete ADLs while adhering to neck  precautions     Vision Baseline Vision/History: 1 Wears  glasses Ability to See in Adequate Light: 0 Adequate Patient Visual Report: No change from baseline Vision Assessment?: No apparent visual deficits            Pertinent Vitals/Pain Pain Assessment: No/denies pain     Hand Dominance Right   Extremity/Trunk Assessment Upper Extremity Assessment Upper Extremity Assessment: Overall WFL for tasks assessed (Pt had rotator cuff sx august 2022)   Lower Extremity Assessment Lower Extremity Assessment: Defer to PT evaluation   Cervical / Trunk Assessment Cervical / Trunk Assessment: Neck Surgery   Communication Communication Communication: No difficulties   Cognition Arousal/Alertness: Awake/alert Behavior During Therapy: WFL for tasks assessed/performed Overall Cognitive Status: Within Functional Limits for tasks assessed             General Comments: recalled neck precautions     General Comments  VSS on RA     Home Living Family/patient expects to be discharged to:: Private residence Living Arrangements: Alone Available Help at Discharge: Family;Available PRN/intermittently (Pt's son is home for the holidays, but does not stay with pt) Type of Home: House Home Access: Stairs to enter [employer] of Steps: 4   Home Layout: Two level;Bed/bath upstairs Alternate Level Stairs-Number of Steps: flight   Bathroom Shower/Tub: Producer, television/film/video: Standard     Home Equipment: Agricultural consultant (2 wheels);Cane - single point          Prior Functioning/Environment Prior Level of Function : Independent/Modified Independent             Mobility Comments: no AD ADLs Comments: indep        OT Problem List: Decreased strength;Decreased activity tolerance;Decreased range of motion;Impaired balance (sitting and/or standing);Decreased safety awareness;Decreased knowledge of use of DME or AE;Decreased knowledge of precautions;Pain      OT Treatment/Interventions:      OT Goals(Current goals  can be found in the care plan section) Acute Rehab OT Goals Patient Stated Goal: home today OT Goal Formulation: With patient Time For Goal Achievement: 07/23/21      AM-PAC OT "6 Clicks" Daily Activity     Outcome Measure Help from another person eating meals?: None Help from another person taking care of personal grooming?: A Little Help from another person toileting, which includes using toliet, bedpan, or urinal?: A Little Help from another person bathing (including washing, rinsing, drying)?: A Little Help from another person to put on and taking off regular upper body clothing?: A Little Help from another person to put on and taking off regular lower body clothing?: A Little 6 Click Score: 19   End of Session Equipment Utilized During Treatment: Cervical collar Nurse Communication: Mobility status  Activity Tolerance: Patient tolerated treatment well Patient left: in bed;with call bell/phone within reach  OT Visit Diagnosis: Unsteadiness on feet (R26.81);Other abnormalities of gait and mobility (R26.89);Muscle weakness (generalized) (M62.81);Pain                Time: 9604-5409 OT Time Calculation (min): 22 min Charges:  OT General Charges $OT Visit: 1 Visit   Celedonio Sortino A Tesneem Dufrane 07/23/2021, 9:36 AM

## 2021-07-23 NOTE — Progress Notes (Signed)
° ° ° °  Subjective: 1 Day Post-Op Procedure(s) (LRB): Right C5-6 and Right C6--7 Foraminotomies (N/A) Awake, alert and oriented x 4. Severe HA yesterday into the evening and now it has abated. He is able to stand and walk into the hallway and is voiding without difficulty.   Patient reports pain as moderate.    Objective:   VITALS:  Temp:  [97.6 F (36.4 C)-99.1 F (37.3 C)] 97.6 F (36.4 C) (12/21 0748) Pulse Rate:  [84-107] 88 (12/21 0748) Resp:  [15-18] 16 (12/21 0748) BP: (104-143)/(66-93) 104/68 (12/21 0748) SpO2:  [94 %-98 %] 96 % (12/21 0748)  Neurologically intact ABD soft Neurovascular intact Sensation intact distally Intact pulses distally Dorsiflexion/Plantar flexion intact Incision: dressing C/D/I and no drainage No cellulitis present Compartment soft   LABS Recent Labs    07/23/21 0558  HGB 14.5  WBC 21.7*  PLT 171   Recent Labs    07/22/21 0551  NA 135  K 3.8  CL 98  CO2 28  BUN 25*  CREATININE 1.28*  GLUCOSE 97   No results for input(s): LABPT, INR in the last 72 hours.   Assessment/Plan: 1 Day Post-Op Procedure(s) (LRB): Right C5-6 and Right C6--7 Foraminotomies (N/A)  Advance diet Up with therapy Discharge home with home health  [name] 07/23/2021, 12:35 PM Patient ID: Erik Cook, male   DOB: Dec 11, 1960, 60 y.o.   MRN: [id number]

## 2021-07-25 ENCOUNTER — Telehealth: Payer: Self-pay | Admitting: Specialist

## 2021-07-25 ENCOUNTER — Telehealth: Payer: Self-pay | Admitting: Orthopedic Surgery

## 2021-07-25 NOTE — Telephone Encounter (Signed)
Patient is having extreme pain in his arms, shoulders and back when getting up from lying down. He wants to know if there are different medications he can use for pain. The meds he was prescribed are not helping ease the pain. Please advise. He states the pain feels like his muscles are cramping.

## 2021-07-25 NOTE — Telephone Encounter (Signed)
Patient is going to take the baclofen instead of the robaxin

## 2021-07-25 NOTE — Telephone Encounter (Signed)
Note made in error please disregard

## 2021-07-28 ENCOUNTER — Encounter: Payer: Self-pay | Admitting: Specialist

## 2021-07-30 ENCOUNTER — Encounter: Payer: Self-pay | Admitting: Surgery

## 2021-07-30 ENCOUNTER — Other Ambulatory Visit: Payer: Self-pay

## 2021-07-30 ENCOUNTER — Ambulatory Visit (INDEPENDENT_AMBULATORY_CARE_PROVIDER_SITE_OTHER): Payer: Medicare HMO | Admitting: Surgery

## 2021-07-30 VITALS — BP 134/84 | HR 80 | Temp 97.5°F

## 2021-07-30 DIAGNOSIS — M4722 Other spondylosis with radiculopathy, cervical region: Secondary | ICD-10-CM

## 2021-07-31 ENCOUNTER — Telehealth: Payer: Self-pay | Admitting: Specialist

## 2021-07-31 NOTE — Telephone Encounter (Signed)
Pt asking for his oxycodone to be changed to hydrocodone (325mg ) per his pcp as he is having stomach problems with the current medication. The best pharmacy is [id number] [place], [place] - [address] and the best call back number if needed is [phone].

## 2021-08-01 NOTE — Progress Notes (Signed)
60 year old male who is 1 week status post right C5-6 and C6-7 foraminotomies returns.  States that he is doing well.  Preop right arm pain is greatly improved.  States that he had to discontinue the oxycodone due to it causing hallucinations.  He also had to go to the emergency department for hypertension.  Currently taking Tylenol, hydrocodone and Lyrica.  Exam Pleasant male alert and oriented in no acute distress.  Surgical incision looks good.  I went ahead and had every staple removed and benzoin and Steri-Strips were applied.  There were no drainage or signs of infection.  Patient is neurologically intact.  Ambulating without any issues.  Plan I stressed to patient the importance of being compliant with no heavy lifting, pushing, pulling.  I will just have him follow-up with me in 1 week for wound check.  Do not apply any creams or ointments to his incision.  Do not remove Steri-Strips.  All questions answered.

## 2021-08-01 NOTE — Progress Notes (Signed)
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  States that symptoms unchanged from previous visit.  He is wanting to proceed with right C5-6 and C6-7 foraminotomies as scheduled.  Today history and physical performed.  Review of systems negative.  Surgical procedure discussed along with potential rehab/recovery time.  All questions answered.

## 2021-08-06 ENCOUNTER — Encounter: Payer: Medicare HMO | Admitting: Orthopedic Surgery

## 2021-08-07 ENCOUNTER — Encounter: Payer: Medicare HMO | Admitting: Surgery

## 2021-08-08 NOTE — Discharge Summary (Signed)
Patient ID: Erik Cook MRN: [id number] DOB/AGE: [id number] 62 y.o.  Admit date: 07/22/2021 Discharge date: 07/23/2021  Admission Diagnoses:  Principal Problem:   Other spondylosis with radiculopathy, cervical region   Discharge Diagnoses:  Principal Problem:   Other spondylosis with radiculopathy, cervical region  status post Procedure(s): Right C5-6 and Right C6--7 Foraminotomies  Past Medical History:  Diagnosis Date   Arthritis    knees, lower back   Complication of anesthesia    Hard time waking up   GERD (gastroesophageal reflux disease)    no current problem - no meds   HLD (hyperlipidemia)    no meds - diet controlled   Hypertension    Sleep apnea    does not use cpap    Surgeries: Procedure(s): Right C5-6 and Right C6--7 Foraminotomies on 07/22/2021   Consultants:   Discharged Condition: Improved  Hospital Course: Erik Cook is an 61 y.o. male who was admitted 07/22/2021 for operative treatment of Other spondylosis with radiculopathy, cervical region. Patient failed conservative treatments (please see the history and physical for the specifics) and had severe unremitting pain that affects sleep, daily activities and work/hobbies. After pre-op clearance, the patient was taken to the operating room on 07/22/2021 and underwent  Procedure(s): Right C5-6 and Right C6--7 Foraminotomies.    Patient was given perioperative antibiotics:  Anti-infectives (From admission, onward)    Start     Dose/Rate Route Frequency Ordered Stop   07/22/21 1600  ceFAZolin (ANCEF) IVPB 2g/100 mL premix        2 g 200 mL/hr over 30 Minutes Intravenous Every 8 hours 07/22/21 1331 07/23/21 0037   07/22/21 0600  ceFAZolin (ANCEF) IVPB 2g/100 mL premix  Status:  Discontinued        2 g 200 mL/hr over 30 Minutes Intravenous On call to O.R. 07/22/21 0550 07/22/21 1330        Patient was given sequential compression devices and early ambulation to prevent DVT.   Patient  benefited maximally from hospital stay and there were no complications. At the time of discharge, the patient was urinating/moving their bowels without difficulty, tolerating a regular diet, pain is controlled with oral pain medications and they have been cleared by PT/OT.   Recent vital signs: No data found.   Recent laboratory studies: No results for input(s): WBC, HGB, HCT, PLT, NA, K, CL, CO2, BUN, CREATININE, GLUCOSE, INR, CALCIUM in the last 72 hours.  Invalid input(s): PT, 2   Discharge Medications:   Allergies as of 07/23/2021   No Known Allergies      Medication List     TAKE these medications    baclofen 10 MG tablet Commonly known as: LIORESAL TAKE 1 TABLET BY MOUTH THREE TIMES DAILY What changed:  when to take this reasons to take this   cholecalciferol 25 MCG (1000 UNIT) tablet Commonly known as: VITAMIN D3 Take 1,000 Units by mouth daily.   lisinopril-hydrochlorothiazide 20-25 MG tablet Commonly known as: ZESTORETIC Take 1 tablet by mouth at bedtime.   meloxicam 15 MG tablet Commonly known as: MOBIC Take 1 tablet (15 mg total) by mouth daily.   methocarbamol 500 MG tablet Commonly known as: ROBAXIN Take 1 tablet (500 mg total) by mouth every 6 (six) hours as needed for muscle spasms.   multivitamin with minerals tablet Take 1 tablet by mouth daily.   Omega-3 1000 MG Caps Take 1,000 mg by mouth daily.   ondansetron 4 MG tablet Commonly known as: ZOFRAN Take 1 tablet (4  mg total) by mouth every 6 (six) hours as needed for nausea or vomiting.   OxyCONTIN 10 mg 12 hr tablet Generic drug: oxyCODONE Take 1 tablet (10 mg total) by mouth every 12 (twelve) hours.   oxyCODONE 5 MG immediate release tablet Commonly known as: Oxy IR/ROXICODONE Take 1 tablet (5 mg total) by mouth every 4 (four) hours as needed for moderate pain or breakthrough pain ((score 4 to 6)).   pregabalin 75 MG capsule Commonly known as: LYRICA Take 75 mg by mouth in the morning  and at bedtime.        Diagnostic Studies: DG Cervical Spine 1 View  Result Date: 07/22/2021 CLINICAL DATA:  61 year old male undergoing cervical spine surgery. EXAM: DG CERVICAL SPINE - 1 VIEW COMPARISON:  Cervical spine radiograph 02/15/2020. FINDINGS: Two intraoperative cross-table lateral fluoroscopic spot views of the cervical spine at [phone] and 0919 hours. On both images a posterior clamp or hemostat is at the level of the C5 spinous process, with the C4 spinous process just above. FLUOROSCOPY TIME:  0 minutes 12 seconds IMPRESSION: Intraoperative localization at the tip of the C5 spinous process This was relayed to Dr. Vira BrownsJAMES NITKA via OR staff on 07/22/2021 at 09:29 . Electronically Signed   By: Odessa FlemingH  Hall M.D.   On: 07/22/2021 09:32   DG C-Arm 1-60 Min-No Report  Result Date: 07/22/2021 Fluoroscopy was utilized by the requesting physician.  No radiographic interpretation.    Discharge Instructions     Call MD / Call 911   Complete by: As directed    If you experience chest pain or shortness of breath, CALL 911 and be transported to the hospital emergency room.  If you develope a fever above 101 F, pus (white drainage) or increased drainage or redness at the wound, or calf pain, call your surgeon's office.   Constipation Prevention   Complete by: As directed    Drink plenty of fluids.  Prune juice may be helpful.  You may use a stool softener, such as Colace (over the counter) 100 mg twice a day.  Use MiraLax (over the counter) for constipation as needed.   Diet - low sodium heart healthy   Complete by: As directed    Discharge instructions   Complete by: As directed    No lifting greater than 10 lbs. Avoid bending, stooping and twisting. Walking in house for first week then may start to get out slowly increasing activity using arms. Keep incision dry for 3 days, may use tegaderm or similar water impervious dressing. Avoid overhead use of arms and overhead lifting. Wear collar for  comfort. Use ice as needed for comfort.  Use a recliner of bed with a wedge to allow elevation of the head and neck To decrease pressure in the cerebrospinal fluid and decrease risk of a reopening of the dural tear that occurred during surgery. For 2 more days  Try to remain at a reduced lifting and exercise level then gradually increase  Standing and walking daily to improve strength and endurance.  Call if any drainage or concern or recurrence of HAs or photophobia (difficulty with light bothering vision).   Driving restrictions   Complete by: As directed    No driving for 2 weeks   Increase activity slowly as tolerated   Complete by: As directed    Lifting restrictions   Complete by: As directed    No lifting for 8 weeks   Post-operative opioid taper instructions:   Complete by: As  directed    POST-OPERATIVE OPIOID TAPER INSTRUCTIONS: It is important to wean off of your opioid medication as soon as possible. If you do not need pain medication after your surgery it is ok to stop day one. Opioids include: Codeine, Hydrocodone(Norco, Vicodin), Oxycodone(Percocet, oxycontin) and hydromorphone amongst others.  Long term and even short term use of opiods can cause: Increased pain response Dependence Constipation Depression Respiratory depression And more.  Withdrawal symptoms can include Flu like symptoms Nausea, vomiting And more Techniques to manage these symptoms Hydrate well Eat regular healthy meals Stay active Use relaxation techniques(deep breathing, meditating, yoga) Do Not substitute Alcohol to help with tapering If you have been on opioids for less than two weeks and do not have pain than it is ok to stop all together.  Plan to wean off of opioids This plan should start within one week post op of your joint replacement. Maintain the same interval or time between taking each dose and first decrease the dose.  Cut the total daily intake of opioids by one tablet each  day Next start to increase the time between doses. The last dose that should be eliminated is the evening dose.           Follow-up Information     Kerrin Champagne, MD Follow up in 2 week(s).   Specialty: Orthopedic Surgery Why: For wound re-check Contact information: [address] [place] [address] [phone]                 Discharge Plan:  discharge to home  Disposition:     Signed: Zonia Kief  08/08/2021, 4:36 PM

## 2021-08-14 ENCOUNTER — Other Ambulatory Visit: Payer: Self-pay

## 2021-08-14 ENCOUNTER — Encounter: Payer: Self-pay | Admitting: Surgery

## 2021-08-14 ENCOUNTER — Ambulatory Visit (INDEPENDENT_AMBULATORY_CARE_PROVIDER_SITE_OTHER): Payer: Medicare HMO | Admitting: Surgery

## 2021-08-14 DIAGNOSIS — M4722 Other spondylosis with radiculopathy, cervical region: Secondary | ICD-10-CM

## 2021-08-14 NOTE — Progress Notes (Signed)
61 year old white male returns for wound check.  He is status post C5-6 and C6-7 foraminotomies on the right.  States that he is doing well.  Has some bilateral trapezius spasms and numbness in his right hand but overall he feels like he is improving.  Exam Wound looks good.  Incision healed no drainage or signs infection.  Plan Patient's neck is doing well at this point.  He will avoid any heavy lifting pushing pulling.  Follow-up with Dr. Otelia Sergeant in 4 weeks for recheck.

## 2021-08-22 ENCOUNTER — Telehealth: Payer: Self-pay

## 2021-08-22 ENCOUNTER — Telehealth: Payer: Self-pay | Admitting: Specialist

## 2021-08-22 NOTE — Telephone Encounter (Signed)
Per Fayrene Fearing called pt and advised to proceed ot the ER. Pt voiced understanding and will go now.

## 2021-08-22 NOTE — Telephone Encounter (Signed)
Pt is s/p C spine surgery 07/22/21 he called and states that he has a clear drainage coming from hisincision that started about 4 days ago "clear fluid running down my back at times" pt asked for cb to discuss [phone]

## 2021-08-22 NOTE — Telephone Encounter (Signed)
I called and spoke with patient. He states that he has had a clear liquid coming out of his incision x 4 days now. I advised that since we can not get him in today to be seen since he waited till 4:15 to call us and he is 2 hours away that he should go to either an Urgent care or to the local ER and have them to evaluate it and make sure its not infected. I did advise that he soul have called as soon as he noticed it but he states that he wasn't feeling well and today was the first day that he felt better.

## 2021-08-22 NOTE — Telephone Encounter (Signed)
Dr. Louanne Skye will call the patient

## 2021-08-22 NOTE — Telephone Encounter (Signed)
Lauren spoke with Fayrene Fearing and he has been provided the pt information and he said that he will take care of the pt and contact .

## 2021-08-25 ENCOUNTER — Other Ambulatory Visit: Payer: Self-pay

## 2021-08-25 ENCOUNTER — Encounter: Payer: Self-pay | Admitting: Specialist

## 2021-08-25 ENCOUNTER — Ambulatory Visit: Payer: Self-pay

## 2021-08-25 ENCOUNTER — Ambulatory Visit (INDEPENDENT_AMBULATORY_CARE_PROVIDER_SITE_OTHER): Payer: Medicare HMO | Admitting: Specialist

## 2021-08-25 VITALS — BP 147/88 | HR 81 | Ht 69.0 in | Wt 216.0 lb

## 2021-08-25 DIAGNOSIS — M4722 Other spondylosis with radiculopathy, cervical region: Secondary | ICD-10-CM

## 2021-08-25 DIAGNOSIS — T8149XA Infection following a procedure, other surgical site, initial encounter: Secondary | ICD-10-CM

## 2021-08-25 MED ORDER — CEPHALEXIN 500 MG PO CAPS: 500.0000 mg | ORAL_CAPSULE | Freq: Four times a day (QID) | ORAL | 0 refills | Status: DC

## 2021-08-25 NOTE — Patient Instructions (Signed)
Plan: Avoid overhead lifting and overhead use of the arms. Do not lift greater than 5 lbs. Adjust head rest in vehicle to prevent hyperextension if rear ended. Take extra precautions to avoid falling. Avoid bending, stooping and avoid lifting weights greater than 10 lbs. Avoid prolong standing and walking. Avoid frequent bending and stooping  No lifting greater than 10 lbs. May use ice or moist heat for pain. Weight loss is of benefit. Handicap license is approved. Apply a new dressing daily.

## 2021-08-25 NOTE — Progress Notes (Signed)
Post-Op Visit Note   Patient: Erik Cook           Date of Birth: 1961/07/23           MRN: [id number] Visit Date: 08/25/2021 PCP: Veverly Fells, MD   Assessment & Plan:4 weeks post op left C4-5 and and C6-7 foramenotomies with incision suture abscess Started with drainage this past Thurs 08/21/2020 from incision inferiorly Spoke with him and he indicated that he was having just a small amount of drainge, no fever or chills, no diabetes Incision with mild erythrema and some bullous formation along the track of the incision,deep expression did not cause any drainage, pushing along the incision expressed about 1 cc of purulence. The incision was prepped and painted with iodine and then the skin Opened. The underlying tissue is granulation, no necrotic tissue, 2 sutures were removed that were at the base of the superficial opening and then silver cell applied. This completely dried up the wound and a bandaid was applied along with a sterile bandaid. Legs Motor is normal in the arms and legs in sitting position. Chief Complaint:  Chief Complaint  Patient presents with   Neck - Routine Post Op, Wound Check   Visit Diagnoses:  1. Spondylosis of cervical spine with radiculopathy     Plan: Avoid overhead lifting and overhead use of the arms. Do not lift greater than 5 lbs. Adjust head rest in vehicle to prevent hyperextension if rear ended. Take extra precautions to avoid falling. Avoid bending, stooping and avoid lifting weights greater than 10 lbs. Avoid prolong standing and walking. Avoid frequent bending and stooping  No lifting greater than 10 lbs. May use ice or moist heat for pain. Weight loss is of benefit. Handicap license is approved. Apply a new dressing daily.  Follow-Up Instructions: No follow-ups on file.   Orders:  Orders Placed This Encounter  Procedures   XR Cervical Spine 2 or 3 views   No orders of the defined types were placed in this  encounter.   Imaging: No results found.  PMFS History: Patient Active Problem List   Diagnosis Date Noted   Other spondylosis with radiculopathy, cervical region 07/22/2021   Complete tear of right rotator cuff    Biceps tendonitis on right    Degenerative superior labral anterior-to-posterior (SLAP) tear of right shoulder    Lumbar radiculopathy 09/28/2016   Past Medical History:  Diagnosis Date   Arthritis    knees, lower back   Complication of anesthesia    Hard time waking up   GERD (gastroesophageal reflux disease)    no current problem - no meds   HLD (hyperlipidemia)    no meds - diet controlled   Hypertension    Sleep apnea    does not use cpap    History reviewed. No pertinent family history.  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   EYE SURGERY Bilateral    Lasik   KNEE ARTHROSCOPY     left   KNEE ARTHROSCOPY     on left side again   POSTERIOR CERVICAL FUSION/FORAMINOTOMY N/A 07/22/2021   Procedure: Right C5-6 and Right C6--7 Foraminotomies;  Surgeon: Kerrin Champagne, MD;  Location: [facility] OR;  Service: Orthopedics;  Laterality: N/A;   ROTATOR  CUFF REPAIR     left   ROTATOR CUFF REPAIR     2nd time around (after fall)   SHOULDER ARTHROSCOPY WITH SUBACROMIAL DECOMPRESSION AND BICEP TENDON REPAIR Right 03/13/2021   Procedure: SHOULDER ARTHROSCOPY WITH SUBACROMIAL DECOMPRESSION, MNIN OPEN ROTATOR CUFF TEAR REPAIR,  BICEPS TENODESIS;  Surgeon: Cammy Copa, MD;  Location: [facility] OR;  Service: Orthopedics;  Laterality: Right;   Social History   Occupational History   Not on file  Tobacco Use   Smoking status: Never   Smokeless tobacco: Never  Vaping Use   Vaping Use: Never used  Substance  and Sexual Activity   Alcohol use: No    Comment: occasional beer - twice month   Drug use: No   Sexual activity: Not on file

## 2021-08-25 NOTE — Telephone Encounter (Signed)
I put him on the schedule

## 2021-08-29 ENCOUNTER — Telehealth: Payer: Self-pay | Admitting: Specialist

## 2021-08-29 NOTE — Telephone Encounter (Signed)
Pt called stating he was here Monday for incision leaking, pt changed dressing and incision is still leaking when he gets up and down stating it is running down his back. Pt stated he is a post op pt but did not want to be sent to triage as he called triage before and did not have a good experience. Please advise pt, the best call back number is [phone].

## 2021-08-29 NOTE — Telephone Encounter (Signed)
Can you please advise?

## 2021-09-01 ENCOUNTER — Telehealth: Payer: Self-pay | Admitting: Specialist

## 2021-09-01 NOTE — Telephone Encounter (Signed)
Patient returned call stating he is doing ok. Patient said he still have fluid running down his back and the fluid is clear. Patient said he is changing the bandage and taking the antibiotic. I read note from [place] to patient.  The number to contact patient is [phone]

## 2021-09-01 NOTE — Telephone Encounter (Signed)
LMOM for patient to call me back and let me know how he is doing, I advised that per Fayrene Fearing he should continue with dressing changes and Keflex

## 2021-09-08 ENCOUNTER — Other Ambulatory Visit: Payer: Self-pay

## 2021-09-08 MED ORDER — CEPHALEXIN 500 MG PO CAPS: 500.0000 mg | ORAL_CAPSULE | Freq: Four times a day (QID) | ORAL | 0 refills | Status: DC

## 2021-09-08 NOTE — Telephone Encounter (Signed)
Patient called into the office stating that there is still some drainage from his wound and that he will be out of antibiotics by this afternoon. He would like to know if he can have some more sent in before his upcoming apt with Dr. Otelia Sergeant

## 2021-09-08 NOTE — Telephone Encounter (Signed)
He still has drainage put does feel it is improving

## 2021-09-11 ENCOUNTER — Other Ambulatory Visit: Payer: Self-pay

## 2021-09-11 ENCOUNTER — Encounter: Payer: Self-pay | Admitting: Specialist

## 2021-09-11 ENCOUNTER — Ambulatory Visit (INDEPENDENT_AMBULATORY_CARE_PROVIDER_SITE_OTHER): Payer: Medicare HMO | Admitting: Specialist

## 2021-09-11 VITALS — BP 124/70 | HR 105 | Ht 69.0 in | Wt 216.0 lb

## 2021-09-11 DIAGNOSIS — T8131XD Disruption of external operation (surgical) wound, not elsewhere classified, subsequent encounter: Secondary | ICD-10-CM

## 2021-09-11 NOTE — Progress Notes (Signed)
Post-Op Visit Note   Patient: Erik Cook           Date of Birth: Jan 21, 1961           MRN: [id number] Visit Date: 09/11/2021 PCP: Veverly Fells, MD   Assessment & Plan: 6 weeks post op right C5-6 and C6-7 foraminotomies  Chief Complaint:  Chief Complaint  Patient presents with   Neck - Routine Post Op  There is continued drainage from the posterior neck incision site in the mid portion of the incision Probed today and there is some necrotic fat and there is drainage that is minimal. He has cell photos that demonstrate The open wound and areas of his tee shirt that has some drainage over areas about a 4x4. There doesn't appear to be constant flow. Motor is normal.   Visit Diagnoses: No diagnosis found.  Plan: Avoid overhead lifting and overhead use of the arms. Do not lift greater than 5 lbs. Adjust head rest in vehicle to prevent hyperextension if rear ended. Take extra precautions to avoid falling. Need to have dressings done with 1/4 packing material soaked with betadiene. This will be important to get this area to heal. Myelogram and post myelogram CT scan ordered to assess for CSF leak.    Follow-Up Instructions: No follow-ups on file.   Orders:  No orders of the defined types were placed in this encounter.  No orders of the defined types were placed in this encounter.   Imaging: No results found.  PMFS History: Patient Active Problem List   Diagnosis Date Noted   Other spondylosis with radiculopathy, cervical region 07/22/2021   Complete tear of right rotator cuff    Biceps tendonitis on right    Degenerative superior labral anterior-to-posterior (SLAP) tear of right shoulder    Lumbar radiculopathy 09/28/2016   Past Medical History:  Diagnosis Date   Arthritis    knees, lower back   Complication of anesthesia    Hard time waking up   GERD (gastroesophageal reflux disease)    no current problem - no meds   HLD (hyperlipidemia)    no meds - diet  controlled   Hypertension    Sleep apnea    does not use cpap    History reviewed. No pertinent family history.  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   EYE SURGERY Bilateral    Lasik   KNEE ARTHROSCOPY     left   KNEE ARTHROSCOPY     on left side again   POSTERIOR CERVICAL FUSION/FORAMINOTOMY N/A 07/22/2021   Procedure: Right C5-6 and Right C6--7 Foraminotomies;  Surgeon: Kerrin Champagne, MD;  Location: [facility] OR;  Service: Orthopedics;  Laterality: N/A;   ROTATOR CUFF REPAIR     left   ROTATOR CUFF REPAIR     2nd time around (after fall)   SHOULDER ARTHROSCOPY WITH SUBACROMIAL DECOMPRESSION AND BICEP TENDON REPAIR Right 03/13/2021   Procedure: SHOULDER ARTHROSCOPY WITH SUBACROMIAL DECOMPRESSION, MNIN OPEN ROTATOR CUFF TEAR REPAIR,  BICEPS TENODESIS;  Surgeon: Cammy Copa, MD;  Location: [facility] OR;  Service: Orthopedics;  Laterality: Right;   Social History   Occupational History   Not on file  Tobacco Use   Smoking status: Never   Smokeless tobacco: Never  Vaping Use   Vaping Use: Never used  Substance and Sexual Activity   Alcohol use: No    Comment: occasional beer - twice month   Drug use: No   Sexual activity: Not on file

## 2021-09-11 NOTE — Patient Instructions (Signed)
Plan: Avoid overhead lifting and overhead use of the arms. Do not lift greater than 5 lbs. Adjust head rest in vehicle to prevent hyperextension if rear ended. Take extra precautions to avoid falling. Need to have dressings done with 1/4 packing material soaked with betadiene. This will be important to get this area to heal. Myelogram and post myelogram CT scan ordered to assess for CSF leak.

## 2021-09-16 ENCOUNTER — Ambulatory Visit (INDEPENDENT_AMBULATORY_CARE_PROVIDER_SITE_OTHER): Payer: Medicare HMO

## 2021-09-16 ENCOUNTER — Other Ambulatory Visit: Payer: Self-pay

## 2021-09-16 DIAGNOSIS — R200 Anesthesia of skin: Secondary | ICD-10-CM

## 2021-09-16 DIAGNOSIS — M542 Cervicalgia: Secondary | ICD-10-CM

## 2021-09-16 DIAGNOSIS — T8131XD Disruption of external operation (surgical) wound, not elsewhere classified, subsequent encounter: Secondary | ICD-10-CM | POA: Diagnosis not present

## 2021-09-16 DIAGNOSIS — G8929 Other chronic pain: Secondary | ICD-10-CM | POA: Diagnosis not present

## 2021-09-29 ENCOUNTER — Other Ambulatory Visit: Payer: Self-pay

## 2021-09-29 ENCOUNTER — Encounter: Payer: Self-pay | Admitting: Specialist

## 2021-09-29 ENCOUNTER — Ambulatory Visit (INDEPENDENT_AMBULATORY_CARE_PROVIDER_SITE_OTHER): Payer: Medicare HMO | Admitting: Specialist

## 2021-09-29 VITALS — BP 119/80 | HR 87 | Ht 69.0 in | Wt 216.0 lb

## 2021-09-29 DIAGNOSIS — T8131XD Disruption of external operation (surgical) wound, not elsewhere classified, subsequent encounter: Secondary | ICD-10-CM

## 2021-09-29 DIAGNOSIS — M4722 Other spondylosis with radiculopathy, cervical region: Secondary | ICD-10-CM

## 2021-09-29 MED ORDER — DICLOFENAC SODIUM 3 % EX GEL: 4.0000 g | Freq: Three times a day (TID) | CUTANEOUS | 5 refills | Status: AC | PRN

## 2021-09-29 NOTE — Progress Notes (Signed)
° °  Post-Op Visit Note   Patient: Erik Cook           Date of Birth: Nov 04, 1960           MRN: [id number] Visit Date: 09/29/2021 PCP: Veverly Fells, MD   Assessment & Plan:2. 25 months post op right 2 level foramenotomies for spondylosis, has improved function but CT scan suggest may have had decompression of the C6-7 and C7-T1  Chief Complaint:  Chief Complaint  Patient presents with   Neck - Routine Post Op   Visit Diagnoses: No diagnosis found.  Plan: Avoid overhead lifting and overhead use of the arms. Do not lift greater than 5 lbs. Adjust head rest in vehicle to prevent hyperextension if rear ended. Take extra precautions to avoid falling. Therapy of the right arm for clinical Radial Tunnel Syndrome. Call if you wish to consider physical therapy for the right forearm.  Follow-Up Instructions: Return in about 4 weeks (around 10/27/2021).   Orders:  No orders of the defined types were placed in this encounter.  No orders of the defined types were placed in this encounter.   Imaging: No results found.  PMFS History: Patient Active Problem List   Diagnosis Date Noted   Other spondylosis with radiculopathy, cervical region 07/22/2021   Complete tear of right rotator cuff    Biceps tendonitis on right    Degenerative superior labral anterior-to-posterior (SLAP) tear of right shoulder    Lumbar radiculopathy 09/28/2016   Past Medical History:  Diagnosis Date   Arthritis    knees, lower back   Complication of anesthesia    Hard time waking up   GERD (gastroesophageal reflux disease)    no current problem - no meds   HLD (hyperlipidemia)    no meds - diet controlled   Hypertension    Sleep apnea    does not use cpap    No family history on file.  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   EYE SURGERY Bilateral    Lasik   KNEE ARTHROSCOPY     left   KNEE ARTHROSCOPY     on left side again   POSTERIOR CERVICAL FUSION/FORAMINOTOMY N/A 07/22/2021   Procedure: Right C5-6 and Right C6--7 Foraminotomies;  Surgeon: Kerrin Champagne, MD;  Location: [facility] OR;  Service: Orthopedics;  Laterality: N/A;   ROTATOR CUFF REPAIR     left   ROTATOR CUFF REPAIR     2nd time around (after fall)   SHOULDER ARTHROSCOPY WITH SUBACROMIAL DECOMPRESSION AND BICEP TENDON REPAIR Right 03/13/2021   Procedure: SHOULDER ARTHROSCOPY WITH SUBACROMIAL DECOMPRESSION, MNIN OPEN ROTATOR CUFF TEAR REPAIR,  BICEPS TENODESIS;  Surgeon: Cammy Copa, MD;  Location: [facility] OR;  Service: Orthopedics;  Laterality: Right;   Social History   Occupational History   Not on file  Tobacco Use   Smoking status: Never   Smokeless tobacco: Never  Vaping Use   Vaping Use: Never used  Substance and Sexual Activity   Alcohol use: No    Comment: occasional beer - twice month   Drug use: No   Sexual activity: Not on file

## 2021-09-29 NOTE — Patient Instructions (Signed)
Avoid overhead lifting and overhead use of the arms. Do not lift greater than 5 lbs. Adjust head rest in vehicle to prevent hyperextension if rear ended. Take extra precautions to avoid falling. Therapy of the right arm for clinical Radial Tunnel Syndrome. Call if you wish to consider physical therapy for the right forearm.

## 2021-10-29 ENCOUNTER — Ambulatory Visit (INDEPENDENT_AMBULATORY_CARE_PROVIDER_SITE_OTHER): Payer: Medicare HMO | Admitting: Specialist

## 2021-10-29 ENCOUNTER — Ambulatory Visit (INDEPENDENT_AMBULATORY_CARE_PROVIDER_SITE_OTHER): Payer: Medicare HMO

## 2021-10-29 ENCOUNTER — Encounter: Payer: Self-pay | Admitting: Specialist

## 2021-10-29 ENCOUNTER — Other Ambulatory Visit: Payer: Self-pay

## 2021-10-29 VITALS — BP 150/83 | HR 66 | Ht 69.0 in | Wt 216.0 lb

## 2021-10-29 DIAGNOSIS — M5412 Radiculopathy, cervical region: Secondary | ICD-10-CM

## 2021-10-29 DIAGNOSIS — R200 Anesthesia of skin: Secondary | ICD-10-CM | POA: Diagnosis not present

## 2021-10-29 DIAGNOSIS — M25521 Pain in right elbow: Secondary | ICD-10-CM

## 2021-10-29 DIAGNOSIS — M7711 Lateral epicondylitis, right elbow: Secondary | ICD-10-CM

## 2021-10-29 DIAGNOSIS — M48062 Spinal stenosis, lumbar region with neurogenic claudication: Secondary | ICD-10-CM

## 2021-10-29 DIAGNOSIS — M4722 Other spondylosis with radiculopathy, cervical region: Secondary | ICD-10-CM

## 2021-10-29 DIAGNOSIS — R202 Paresthesia of skin: Secondary | ICD-10-CM

## 2021-10-29 MED ORDER — DICLOFENAC SODIUM 1 % EX GEL: 2.0000 g | Freq: Four times a day (QID) | CUTANEOUS | 3 refills | Status: AC

## 2021-10-29 MED ORDER — TRIAMCINOLONE ACETONIDE 40 MG/ML IJ SUSP
40.0000 mg | INTRAMUSCULAR | Status: AC | PRN
  Administered 2021-10-29: 40 mg

## 2021-10-29 MED ORDER — LIDOCAINE HCL 1 % IJ SOLN
1.0000 mL | INTRAMUSCULAR | Status: AC | PRN
  Administered 2021-10-29: 1 mL

## 2021-10-29 MED ORDER — BUPIVACAINE HCL 0.5 % IJ SOLN
3.0000 mL | INTRAMUSCULAR | Status: AC | PRN
  Administered 2021-10-29: 3 mL

## 2021-10-29 NOTE — Progress Notes (Signed)
? ?Office Visit Note ?  ?Patient: Erik Cook           ?Date of Birth: Jan 22, 1961           ?MRN: [id number] ?Visit Date: 10/29/2021 ?             ?Requested by: Veverly Fells, MD ?[phone] [place] [address] ?[place],  [place] [address] ?PCP: Veverly Fells, MD ? ? ?Assessment & Plan: ?Visit Diagnoses:  ?1. Pain in right elbow   ? ? ?Plan: Avoid overhead lifting and overhead use of the arms. ?Do not lift greater than 5 lbs. ?Adjust head rest in vehicle to prevent hyperextension if rear ended. ?Take extra precautions to avoid falling.Tennis Elbow ?Tennis elbow is irritation and swelling (inflammation) in your outer forearm, near your elbow. Swelling affects the tissues that connect muscle to bone (tendons). Tennis elbow can happen playing any sport or doing any job where you use your elbow too much. It is caused by doing the same motion over and over. ?What are the causes? ?This condition is often caused by playing sports or doing work where you need to keep moving your forearm the same way. Sometimes, it may be caused by a sudden injury. ?What increases the risk? ?You are more likely to get tennis elbow if you play tennis or another racket sport. You also have a higher risk if you often use your hands for work. This includes: ?People who use computers. ?Holiday representative workers. ?People who work in a factory. ?Musicians. ?Cooks. ?Cashiers. ?What are the signs or symptoms? ?Pain and tenderness in your forearm and the outer part of your elbow. You may have pain all the time or only when you use your arm. ?A burning feeling. This starts in your elbow and spreads down your arm. ?A weak grip in your hand. ?How is this treated? ?Resting and icing your arm is often the first treatment. Your doctor may also recommend: ?Medicines to reduce pain and swelling. ?An elbow strap. ?Physical therapy. This may include massage or exercises or both. ?An elbow brace. ?If these do not help your symptoms get better, your doctor may recommend  surgery. ?Follow these instructions at home: ?If you have a brace or strap: ?Wear the brace or strap as told by your doctor. Take it off only as told by your doctor. ?Check the skin around the brace or strap every day. Tell your doctor if you see problems. ?Loosen it if your fingers: ?Tingle. ?Become numb. ?Turn cold and blue. ?Keep the brace or strap clean. ?If the brace or strap is not waterproof: ?Do not let it get wet. ?Cover it with a watertight covering when you take a bath or a shower. ?Managing pain, stiffness, and swelling ? ?If told, put ice on the injured area. To do this: ?If you have a removable brace or strap, take it off as told by your doctor. ?Put ice in a plastic bag. ?Place a towel between your skin and the bag. ?Leave the ice on for 20 minutes, 2-3 times a day. ?Take off the ice if your skin turns bright red. This is very important. If you cannot feel pain, heat, or cold, you have a greater risk of damage to the area. ?Move your fingers often. ?Activity ?Rest your elbow and wrist. Avoid activities that can cause elbow problems as told by your doctor. ?Do exercises as told by your doctor. ?If you lift an object, lift it with your palm facing up. ?Lifestyle ?If  your tennis elbow is caused by sports, check your equipment and make sure that: ?You are using it the right way. ?It fits you well. ?If your tennis elbow is caused by work or computer use, take breaks often to stretch your arm. Talk with your manager about how you can make your condition better at work. ?General instructions ?Take over-the-counter and prescription medicines only as told by your doctor. ?Do not smoke or use any products that contain nicotine or tobacco. If you need help quitting, ask your doctor. ?Keep all follow-up visits. ?How is this prevented? ?Before and after being active: ?Warm up and stretch before being active. ?Cool down and stretch after being active. ?Give your body time to rest between activities. ?While being  active: ?Make sure to use equipment that fits you. ?If you play tennis, put power in your stroke with your lower body. Avoid using your arm only. ?Maintain physical fitness. This includes: ?Strength. ?Flexibility. ?Endurance. ?Do exercises to strengthen the forearm muscles. ?Contact a doctor if: ?Your pain does not get better with treatment. ?Your pain gets worse. ?You have weakness in your forearm, hand, or fingers. ?You cannot feel your forearm, hand, or fingers. ?Get help right away if: ?Your pain is very bad. ?You cannot move your wrist. ?Summary ?Tennis elbow is irritation and swelling (inflammation) in your outer forearm, near your elbow. ?Tennis elbow is caused by doing the same motion over and over. ?Rest your elbow and wrist. Avoid activities as told by your doctor. ?If told, put ice on the injured area for 20 minutes, 2-3 times a day. ?This information is not intended to replace advice given to you by your health care provider. Make sure you discuss any questions you have with your health care provider. ?Document Revised: 01/30/2020 Document Reviewed: 01/30/2020 ?Elsevier Patient Education ? 2022 [employer] ? ?Follow-Up Instructions: Return in about 4 weeks (around 11/26/2021).  ? ?Orders:  ?Orders Placed This Encounter  ?Procedures  ?? XR Elbow 2 Views Right  ? ?No orders of the defined types were placed in this encounter. ? ? ? ? Procedures: ?Hand/UE Inj for lateral epicondylitis on 10/29/2021 10:59 AM ?Details: 22 G needle, lateral approach ?Medications: 1 mL lidocaine 1 %; 3 mL bupivacaine 0.5 %; 40 mg triamcinolone acetonide 40 MG/ML ? ?Bandaid applied ? ? ? ?Clinical Data: ?No additional findings. ? ? ?Subjective: ?Chief Complaint  ?Patient presents with  ?? Neck - Follow-up  ? ? ?HPI ? ?Review of Systems  ?Constitutional: Negative.   ?HENT: Negative.    ?Eyes: Negative.   ?Respiratory: Negative.    ?Cardiovascular: Negative.   ?Gastrointestinal: Negative.   ?Endocrine: Negative.   ?Genitourinary:  Negative.   ?Musculoskeletal: Negative.   ?Skin: Negative.   ?Allergic/Immunologic: Negative.   ?Neurological: Negative.   ?Hematological: Negative.   ?Psychiatric/Behavioral: Negative.    ? ? ?Objective: ?Vital Signs: BP (!) 150/83 (BP Location: Left Arm, Patient Position: Sitting)   Pulse 66   Ht 5\' 9"  (1.753 m)   Wt 216 lb (98 kg)   BMI 31.90 kg/m?  ? ?Physical Exam ? ?Ortho Exam ? ?Specialty Comments:  ?No specialty comments available. ? ?Imaging: ?XR Elbow 2 Views Right ? ?Result Date: 10/29/2021 ?Right elbow AP and lateral radiographs show minimal squaring of the radial head neck medial and dorsally, there is light calcification of the joint articular cartilage and in the area of the joint portion of the lateral epicondyle, no acute changes  ? ? ?PMFS History: ?Patient Active Problem List  ?  Diagnosis Date Noted  ?? Other spondylosis with radiculopathy, cervical region 07/22/2021  ?? Complete tear of right rotator cuff   ?? Biceps tendonitis on right   ?? Degenerative superior labral anterior-to-posterior (SLAP) tear of right shoulder   ?? Lumbar radiculopathy 09/28/2016  ? ?Past Medical History:  ?Diagnosis Date  ?? Arthritis   ? knees, lower back  ?? Complication of anesthesia   ? Hard time waking up  ?? GERD (gastroesophageal reflux disease)   ? no current problem - no meds  ?? HLD (hyperlipidemia)   ? no meds - diet controlled  ?? Hypertension   ?? Sleep apnea   ? does not use cpap  ?  ?No family history on file.  ?Past Surgical History:  ?Procedure Laterality Date  ?? ANTERIOR CERVICAL DECOMP/DISCECTOMY FUSION N/A 10/05/2012  ? 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 EYE SURGERY Bilateral   ? Lasik  ?? KNEE ARTHROSCOPY    ? left  ?? KNEE ARTHROSCOPY    ? on left side again  ?? POSTERIOR CERVICAL FUSION/FORAMINOTOMY N/A 07/22/2021  ?  Procedure: Right C5-6 and Right C6--7 Foraminotomies;  Surgeon: Kerrin Champagne, MD;  Location: [facility] OR;  Service: Orthopedics;  Laterality: N/A;  ?? ROTATOR CUFF REPAIR    ? left  ?? ROTATOR CUFF REPAIR    ? 2nd time around (af

## 2021-10-29 NOTE — Patient Instructions (Signed)
Avoid overhead lifting and overhead use of the arms. ?Do not lift greater than 5 lbs. ?Adjust head rest in vehicle to prevent hyperextension if rear ended. ?Take extra precautions to avoid falling.Tennis Elbow ?Tennis elbow is irritation and swelling (inflammation) in your outer forearm, near your elbow. Swelling affects the tissues that connect muscle to bone (tendons). Tennis elbow can happen playing any sport or doing any job where you use your elbow too much. It is caused by doing the same motion over and over. ?What are the causes? ?This condition is often caused by playing sports or doing work where you need to keep moving your forearm the same way. Sometimes, it may be caused by a sudden injury. ?What increases the risk? ?You are more likely to get tennis elbow if you play tennis or another racket sport. You also have a higher risk if you often use your hands for work. This includes: ?People who use computers. ?Holiday representative workers. ?People who work in a factory. ?Musicians. ?Cooks. ?Cashiers. ?What are the signs or symptoms? ?Pain and tenderness in your forearm and the outer part of your elbow. You may have pain all the time or only when you use your arm. ?A burning feeling. This starts in your elbow and spreads down your arm. ?A weak grip in your hand. ?How is this treated? ?Resting and icing your arm is often the first treatment. Your doctor may also recommend: ?Medicines to reduce pain and swelling. ?An elbow strap. ?Physical therapy. This may include massage or exercises or both. ?An elbow brace. ?If these do not help your symptoms get better, your doctor may recommend surgery. ?Follow these instructions at home: ?If you have a brace or strap: ?Wear the brace or strap as told by your doctor. Take it off only as told by your doctor. ?Check the skin around the brace or strap every day. Tell your doctor if you see problems. ?Loosen it if your fingers: ?Tingle. ?Become numb. ?Turn cold and blue. ?Keep the  brace or strap clean. ?If the brace or strap is not waterproof: ?Do not let it get wet. ?Cover it with a watertight covering when you take a bath or a shower. ?Managing pain, stiffness, and swelling ? ?If told, put ice on the injured area. To do this: ?If you have a removable brace or strap, take it off as told by your doctor. ?Put ice in a plastic bag. ?Place a towel between your skin and the bag. ?Leave the ice on for 20 minutes, 2-3 times a day. ?Take off the ice if your skin turns bright red. This is very important. If you cannot feel pain, heat, or cold, you have a greater risk of damage to the area. ?Move your fingers often. ?Activity ?Rest your elbow and wrist. Avoid activities that can cause elbow problems as told by your doctor. ?Do exercises as told by your doctor. ?If you lift an object, lift it with your palm facing up. ?Lifestyle ?If your tennis elbow is caused by sports, check your equipment and make sure that: ?You are using it the right way. ?It fits you well. ?If your tennis elbow is caused by work or computer use, take breaks often to stretch your arm. Talk with your manager about how you can make your condition better at work. ?General instructions ?Take over-the-counter and prescription medicines only as told by your doctor. ?Do not smoke or use any products that contain nicotine or tobacco. If you need help quitting, ask your doctor. ?Keep  all follow-up visits. ?How is this prevented? ?Before and after being active: ?Warm up and stretch before being active. ?Cool down and stretch after being active. ?Give your body time to rest between activities. ?While being active: ?Make sure to use equipment that fits you. ?If you play tennis, put power in your stroke with your lower body. Avoid using your arm only. ?Maintain physical fitness. This includes: ?Strength. ?Flexibility. ?Endurance. ?Do exercises to strengthen the forearm muscles. ?Contact a doctor if: ?Your pain does not get better with  treatment. ?Your pain gets worse. ?You have weakness in your forearm, hand, or fingers. ?You cannot feel your forearm, hand, or fingers. ?Get help right away if: ?Your pain is very bad. ?You cannot move your wrist. ?Summary ?Tennis elbow is irritation and swelling (inflammation) in your outer forearm, near your elbow. ?Tennis elbow is caused by doing the same motion over and over. ?Rest your elbow and wrist. Avoid activities as told by your doctor. ?If told, put ice on the injured area for 20 minutes, 2-3 times a day. ?This information is not intended to replace advice given to you by your health care provider. Make sure you discuss any questions you have with your health care provider. ?Document Revised: 01/30/2020 Document Reviewed: 01/30/2020 ?Elsevier Patient Education ? 2022 Elsevier Inc. ? ?

## 2021-11-14 ENCOUNTER — Other Ambulatory Visit: Payer: Self-pay | Admitting: Specialist

## 2021-11-14 DIAGNOSIS — M7711 Lateral epicondylitis, right elbow: Secondary | ICD-10-CM

## 2021-12-24 ENCOUNTER — Encounter: Payer: Self-pay | Admitting: Specialist

## 2022-02-13 ENCOUNTER — Telehealth: Payer: Self-pay | Admitting: Physical Medicine and Rehabilitation

## 2022-02-13 NOTE — Telephone Encounter (Signed)
Patient called needing to schedule an appointment with Dr. Alvester Morin for his lower back. The number to contact patient is [phone]

## 2022-02-14 ENCOUNTER — Other Ambulatory Visit: Payer: Self-pay | Admitting: Specialist

## 2022-02-16 ENCOUNTER — Other Ambulatory Visit: Payer: Self-pay | Admitting: Radiology

## 2022-02-16 DIAGNOSIS — M5416 Radiculopathy, lumbar region: Secondary | ICD-10-CM

## 2022-02-16 MED ORDER — BACLOFEN 10 MG PO TABS: 10.0000 mg | ORAL_TABLET | Freq: Three times a day (TID) | ORAL | 0 refills | Status: AC

## 2022-03-10 ENCOUNTER — Ambulatory Visit: Payer: Self-pay

## 2022-03-10 ENCOUNTER — Encounter: Payer: Self-pay | Admitting: Physical Medicine and Rehabilitation

## 2022-03-10 ENCOUNTER — Ambulatory Visit (INDEPENDENT_AMBULATORY_CARE_PROVIDER_SITE_OTHER): Payer: Medicare HMO | Admitting: Physical Medicine and Rehabilitation

## 2022-03-10 VITALS — BP 129/83 | HR 78

## 2022-03-10 DIAGNOSIS — M5416 Radiculopathy, lumbar region: Secondary | ICD-10-CM | POA: Diagnosis not present

## 2022-03-10 MED ORDER — METHYLPREDNISOLONE ACETATE 80 MG/ML IJ SUSP
80.0000 mg | Freq: Once | INTRAMUSCULAR | Status: AC
  Administered 2022-03-10: 80 mg

## 2022-03-10 NOTE — Progress Notes (Signed)
Pt state lower back pain that travels down his left leg. Pt state standing and step down on with his leg makes the pain worse. Pt state he take over the counter pain meds to help ease his pain.  Numeric Pain Rating Scale and Functional Assessment Average Pain 8   In the last MONTH (on 0-10 scale) has pain interfered with the following?  1. General activity like being  able to carry out your everyday physical activities such as walking, climbing stairs, carrying groceries, or moving a chair?  Rating(10)   +Driver, -BT, -Dye Allergies.

## 2022-03-18 NOTE — Procedures (Signed)
S1 Lumbosacral Transforaminal Epidural Steroid Injection - Sub-Pedicular Approach with Fluoroscopic Guidance   Patient: Erik Cook      Date of Birth: 08-24-1960 MRN: [id number] PCP: Veverly Fells, MD      Visit Date: 03/10/2022   Universal Protocol:    Date/Time: 08/16/236:37 AM  Consent Given By: the patient  Position:  PRONE  Additional Comments: Vital signs were monitored before and after the procedure. Patient was prepped and draped in the usual sterile fashion. The correct patient, procedure, and site was verified.   Injection Procedure Details:  Procedure Site One Meds Administered:  Meds ordered this encounter  Medications   methylPREDNISolone acetate (DEPO-MEDROL) injection 80 mg    Laterality: Left  Location/Site:  S1 Foramen   Needle size: 22 ga.  Needle type: Spinal  Needle Placement: Transforaminal  Findings:   -Comments: Excellent flow of contrast along the nerve, nerve root and into the epidural space.  Epidurogram: Contrast epidurogram showed no nerve root cut off or restricted flow pattern.  Procedure Details: After squaring off the sacral end-plate to get a true AP view, the C-arm was positioned so that the best possible view of the S1 foramen was visualized. The soft tissues overlying this structure were infiltrated with 2-3 ml. of 1% Lidocaine without Epinephrine.    The spinal needle was inserted toward the target using a "trajectory" view along the fluoroscope beam.  Under AP and lateral visualization, the needle was advanced so it did not puncture dura. Biplanar projections were used to confirm position. Aspiration was confirmed to be negative for CSF and/or blood. A 1-2 ml. volume of Isovue-250 was injected and flow of contrast was noted at each level. Radiographs were obtained for documentation purposes.   After attaining the desired flow of contrast documented above, a 0.5 to 1.0 ml test dose of 0.25% Marcaine was injected into each  respective transforaminal space.  The patient was observed for 90 seconds post injection.  After no sensory deficits were reported, and normal lower extremity motor function was noted,   the above injectate was administered so that equal amounts of the injectate were placed at each foramen (level) into the transforaminal epidural space.   Additional Comments:  The patient tolerated the procedure well Dressing: Band-Aid with 2 x 2 sterile gauze    Post-procedure details: Patient was observed during the procedure. Post-procedure instructions were reviewed.  Patient left the clinic in stable condition.

## 2022-03-18 NOTE — Progress Notes (Signed)
Erik Cook - 61 y.o. male MRN [id number]  Date of birth: Dec 20, 1960  Office Visit Note: Visit Date: 03/10/2022 PCP: Veverly Fells, MD Referred by: Veverly Fells, MD  Subjective: Chief Complaint  Patient presents with   Lower Back - Pain   Left Leg - Pain   HPI:  Erik Cook is a 61 y.o. male who comes in today for planned repeat Left S1-2  Lumbar Transforaminal epidural steroid injection with fluoroscopic guidance.  The patient has failed conservative care including home exercise, medications, time and activity modification.  This injection will be diagnostic and hopefully therapeutic.  Please see requesting physician notes for further details and justification. Patient received more than 50% pain relief from prior injection.   Referring: Dr. [name]   ROS Otherwise per HPI.  Assessment & Plan: Visit Diagnoses:    ICD-10-CM   1. Lumbar radiculopathy  M54.16 XR C-ARM NO REPORT    Epidural Steroid injection    methylPREDNISolone acetate (DEPO-MEDROL) injection 80 mg      Plan: No additional findings.   Meds & Orders:  Meds ordered this encounter  Medications   methylPREDNISolone acetate (DEPO-MEDROL) injection 80 mg    Orders Placed This Encounter  Procedures   XR C-ARM NO REPORT   Epidural Steroid injection    Follow-up: Return for visit to requesting provider as needed.   Procedures: No procedures performed  S1 Lumbosacral Transforaminal Epidural Steroid Injection - Sub-Pedicular Approach with Fluoroscopic Guidance   Patient: Erik Cook      Date of Birth: [birth year] MRN: [id number] PCP: Veverly Fells, MD      Visit Date: 03/10/2022   Universal Protocol:    Date/Time: 08/16/236:37 AM  Consent Given By: the patient  Position:  PRONE  Additional Comments: Vital signs were monitored before and after the procedure. Patient was prepped and draped in the usual sterile fashion. The correct patient, procedure, and site was  verified.   Injection Procedure Details:  Procedure Site One Meds Administered:  Meds ordered this encounter  Medications   methylPREDNISolone acetate (DEPO-MEDROL) injection 80 mg    Laterality: Left  Location/Site:  S1 Foramen   Needle size: 22 ga.  Needle type: Spinal  Needle Placement: Transforaminal  Findings:   -Comments: Excellent flow of contrast along the nerve, nerve root and into the epidural space.  Epidurogram: Contrast epidurogram showed no nerve root cut off or restricted flow pattern.  Procedure Details: After squaring off the sacral end-plate to get a true AP view, the C-arm was positioned so that the best possible view of the S1 foramen was visualized. The soft tissues overlying this structure were infiltrated with 2-3 ml. of 1% Lidocaine without Epinephrine.    The spinal needle was inserted toward the target using a "trajectory" view along the fluoroscope beam.  Under AP and lateral visualization, the needle was advanced so it did not puncture dura. Biplanar projections were used to confirm position. Aspiration was confirmed to be negative for CSF and/or blood. A 1-2 ml. volume of Isovue-250 was injected and flow of contrast was noted at each level. Radiographs were obtained for documentation purposes.   After attaining the desired flow of contrast documented above, a 0.5 to 1.0 ml test dose of 0.25% Marcaine was injected into each respective transforaminal space.  The patient was observed for 90 seconds post injection.  After no sensory deficits were reported, and normal lower extremity motor function was noted,   the above injectate was administered so  that equal amounts of the injectate were placed at each foramen (level) into the transforaminal epidural space.   Additional Comments:  The patient tolerated the procedure well Dressing: Band-Aid with 2 x 2 sterile gauze    Post-procedure details: Patient was observed during the  procedure. Post-procedure instructions were reviewed.  Patient left the clinic in stable condition.   Clinical History: No specialty comments available.     Objective:  VS:  HT:    WT:   BMI:     BP:129/83  HR:78bpm  TEMP: ( )  RESP:  Physical Exam Vitals and nursing note reviewed.  Constitutional:      General: He is not in acute distress.    Appearance: Normal appearance. He is not ill-appearing.  HENT:     Head: Normocephalic and atraumatic.     Right Ear: External ear normal.     Left Ear: External ear normal.     Nose: No congestion.  Eyes:     Extraocular Movements: Extraocular movements intact.  Cardiovascular:     Rate and Rhythm: Normal rate.     Pulses: Normal pulses.  Pulmonary:     Effort: Pulmonary effort is normal. No respiratory distress.  Abdominal:     General: There is no distension.     Palpations: Abdomen is soft.  Musculoskeletal:        General: No tenderness or signs of injury.     Cervical back: Neck supple.     Right lower leg: No edema.     Left lower leg: No edema.     Comments: Patient has good distal strength without clonus.  Skin:    Findings: No erythema or rash.  Neurological:     General: No focal deficit present.     Mental Status: He is alert and oriented to person, place, and time.     Sensory: No sensory deficit.     Motor: No weakness or abnormal muscle tone.     Coordination: Coordination normal.  Psychiatric:        Mood and Affect: Mood normal.        Behavior: Behavior normal.      Imaging: No results found.

## 2022-03-25 ENCOUNTER — Ambulatory Visit (INDEPENDENT_AMBULATORY_CARE_PROVIDER_SITE_OTHER): Payer: Medicare HMO | Admitting: Specialist

## 2022-03-25 ENCOUNTER — Encounter: Payer: Self-pay | Admitting: Specialist

## 2022-03-25 VITALS — BP 148/87 | HR 87 | Ht 69.0 in | Wt 216.0 lb

## 2022-03-25 DIAGNOSIS — M25521 Pain in right elbow: Secondary | ICD-10-CM | POA: Diagnosis not present

## 2022-03-25 DIAGNOSIS — M5416 Radiculopathy, lumbar region: Secondary | ICD-10-CM | POA: Diagnosis not present

## 2022-03-25 DIAGNOSIS — M7711 Lateral epicondylitis, right elbow: Secondary | ICD-10-CM | POA: Diagnosis not present

## 2022-03-25 DIAGNOSIS — M48062 Spinal stenosis, lumbar region with neurogenic claudication: Secondary | ICD-10-CM

## 2022-03-25 DIAGNOSIS — M4722 Other spondylosis with radiculopathy, cervical region: Secondary | ICD-10-CM | POA: Diagnosis not present

## 2022-03-25 NOTE — Progress Notes (Signed)
Office Visit Note   Patient: Erik Cook           Date of Birth: Apr 03, 1961           MRN: [id number] Visit Date: 03/25/2022              Requested by: Veverly Fells, MD [address] [place],  [place] [address] PCP: Veverly Fells, MD   Assessment & Plan: Visit Diagnoses:  1. Radiculopathy, lumbar region   2. Pain in right elbow   3. Spondylosis of cervical spine with radiculopathy   4. Lateral epicondylitis, right elbow   5. Spinal stenosis of lumbar region with neurogenic claudication     Plan: Avoid frequent bending and stooping  No lifting greater than 10 lbs. May use ice or moist heat for pain. Weight loss is of benefit. Best medication for lumbar disc disease is arthritis medications like motrin, celebrex and naprosyn. Exercise is important to improve your indurance and does allow people to function better inspite of back pain. MRI of the lumbar spine due to left L2 and L3 weakness and radiculopathy not improved with chiropractic treatment and ESI.  Follow-Up Instructions: No follow-ups on file.   Orders:  No orders of the defined types were placed in this encounter.  No orders of the defined types were placed in this encounter.     Procedures: No procedures performed   Clinical Data: No additional findings.   Subjective: Chief Complaint  Patient presents with   Lower Back - Follow-up    61 year old male with left thigh and hip pain and weakness. He saw a chiropractor due to excruciating pain. Newton gave injection with only 0% improvement. Stairs in and out of RV is difficult. He has to pull himself up with arms. Was moving his [place] off ramp and he was moved by the car when it started to come down the ramp. Was down 2-3 days, had DC perform some therapy. He had ESI by Dr. Alvester Morin.     Review of Systems   Objective: Vital Signs: BP (!) 148/87 (BP Location: Left Arm, Patient Position: Sitting)   Pulse 87   Ht 5\' 9"  (1.753 m)   Wt 216 lb  (98 kg)   BMI 31.90 kg/m   Physical Exam Constitutional:      Appearance: He is well-developed.  HENT:     Head: Normocephalic and atraumatic.  Eyes:     Pupils: Pupils are equal, round, and reactive to light.  Pulmonary:     Effort: Pulmonary effort is normal.     Breath sounds: Normal breath sounds.  Abdominal:     General: Bowel sounds are normal.     Palpations: Abdomen is soft.  Musculoskeletal:     Cervical back: Normal range of motion and neck supple.     Lumbar back: Positive left straight leg raise test.  Skin:    General: Skin is warm and dry.  Neurological:     Mental Status: He is alert and oriented to person, place, and time.  Psychiatric:        Behavior: Behavior normal.        Thought Content: Thought content normal.        Judgment: Judgment normal.    Back Exam   Tenderness  The patient is experiencing tenderness in the lumbar.  Range of Motion  Extension:  abnormal  Flexion:  abnormal  Lateral bend right:  abnormal  Lateral bend  left:  abnormal  Rotation right:  abnormal  Rotation left:  abnormal   Muscle Strength  Left Quadriceps:  3/5  Left Hamstrings:  5/5   Tests  Straight leg raise left: positive  Reflexes  Patellar:  0/4 Achilles:  0/4 Biceps:  0/4 Babinski's sign: normal   Other  Toe walk: abnormal Heel walk: abnormal Sensation: decreased Gait: antalgic   Comments:  Weak left knee extension 3/5 and left hip flexion weakness 3/5.      Specialty Comments:  No specialty comments available.  Imaging: No results found.   PMFS History: Patient Active Problem List   Diagnosis Date Noted   Other spondylosis with radiculopathy, cervical region 07/22/2021   Complete tear of right rotator cuff    Biceps tendonitis on right    Degenerative superior labral anterior-to-posterior (SLAP) tear of right shoulder    Lumbar radiculopathy 09/28/2016   Past Medical History:  Diagnosis Date   Arthritis    knees, lower back    Complication of anesthesia    Hard time waking up   GERD (gastroesophageal reflux disease)    no current problem - no meds   HLD (hyperlipidemia)    no meds - diet controlled   Hypertension    Sleep apnea    does not use cpap    History reviewed. No pertinent family history.  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   EYE SURGERY Bilateral    Lasik   KNEE ARTHROSCOPY     left   KNEE ARTHROSCOPY     on left side again   POSTERIOR CERVICAL FUSION/FORAMINOTOMY N/A 07/22/2021   Procedure: Right C5-6 and Right C6--7 Foraminotomies;  Surgeon: Kerrin Champagne, MD;  Location: [facility] OR;  Service: Orthopedics;  Laterality: N/A;   ROTATOR CUFF REPAIR     left   ROTATOR CUFF REPAIR     2nd time around (after fall)   SHOULDER ARTHROSCOPY WITH SUBACROMIAL DECOMPRESSION AND BICEP TENDON REPAIR Right 03/13/2021   Procedure: SHOULDER ARTHROSCOPY WITH SUBACROMIAL DECOMPRESSION, MNIN OPEN ROTATOR CUFF TEAR REPAIR,  BICEPS TENODESIS;  Surgeon: Cammy Copa, MD;  Location: [facility] OR;  Service: Orthopedics;  Laterality: Right;   Social History   Occupational History   Not on file  Tobacco Use   Smoking status: Never   Smokeless tobacco: Never  Vaping Use   Vaping Use: Never used  Substance and Sexual Activity   Alcohol use: No    Comment: occasional beer - twice month   Drug use: No   Sexual activity: Not on file

## 2022-03-25 NOTE — Patient Instructions (Signed)
Plan: Avoid frequent bending and stooping  No lifting greater than 10 lbs. May use ice or moist heat for pain. Weight loss is of benefit. Best medication for lumbar disc disease is arthritis medications like motrin, celebrex and naprosyn. Exercise is important to improve your indurance and does allow people to function better inspite of back pain. MRI of the lumbar spine due to left L2 and L3 weakness and radiculopathy not improved with chiropractic treatment and ESI.  Follow-Up Instructions: No follow-ups on file.

## 2022-03-25 NOTE — Addendum Note (Signed)
Addended by: [name] on: 03/25/2022 10:18 AM   Modules accepted: Orders

## 2022-03-29 ENCOUNTER — Ambulatory Visit
Admission: RE | Admit: 2022-03-29 | Discharge: 2022-03-29 | Disposition: A | Discharge status: Home / Self Care | Payer: Medicare HMO | Source: Ambulatory Visit | Attending: Specialist | Admitting: Specialist

## 2022-03-29 DIAGNOSIS — M5416 Radiculopathy, lumbar region: Secondary | ICD-10-CM

## 2022-03-29 DIAGNOSIS — M48062 Spinal stenosis, lumbar region with neurogenic claudication: Secondary | ICD-10-CM

## 2022-04-15 ENCOUNTER — Ambulatory Visit (INDEPENDENT_AMBULATORY_CARE_PROVIDER_SITE_OTHER): Payer: Medicare HMO | Admitting: Specialist

## 2022-04-15 ENCOUNTER — Encounter: Payer: Self-pay | Admitting: Specialist

## 2022-04-15 VITALS — BP 132/89 | HR 65 | Ht 69.0 in | Wt 216.0 lb

## 2022-04-15 DIAGNOSIS — R202 Paresthesia of skin: Secondary | ICD-10-CM

## 2022-04-15 DIAGNOSIS — M5416 Radiculopathy, lumbar region: Secondary | ICD-10-CM | POA: Diagnosis not present

## 2022-04-15 DIAGNOSIS — M5412 Radiculopathy, cervical region: Secondary | ICD-10-CM

## 2022-04-15 DIAGNOSIS — M7711 Lateral epicondylitis, right elbow: Secondary | ICD-10-CM | POA: Diagnosis not present

## 2022-04-15 DIAGNOSIS — M4722 Other spondylosis with radiculopathy, cervical region: Secondary | ICD-10-CM

## 2022-04-15 DIAGNOSIS — M5030 Other cervical disc degeneration, unspecified cervical region: Secondary | ICD-10-CM

## 2022-04-15 DIAGNOSIS — R29898 Other symptoms and signs involving the musculoskeletal system: Secondary | ICD-10-CM

## 2022-04-15 DIAGNOSIS — M5116 Intervertebral disc disorders with radiculopathy, lumbar region: Secondary | ICD-10-CM

## 2022-04-15 MED ORDER — HYDROCODONE-ACETAMINOPHEN 7.5-325 MG PO TABS: 1.0000 | ORAL_TABLET | Freq: Four times a day (QID) | ORAL | 0 refills | Status: AC | PRN

## 2022-04-15 NOTE — Progress Notes (Signed)
Office Visit Note   Patient: Erik Cook           Date of Birth: 1960/12/01           MRN: [id number] Visit Date: 04/15/2022              Requested by: Veverly Fells, MD [address] [place],  [place] [address] PCP: Veverly Fells, MD   Assessment & Plan: Visit Diagnoses:  1. Radiculopathy, lumbar region   2. Spondylosis of cervical spine with radiculopathy   3. Lumbar radiculopathy   4. Lateral epicondylitis, right elbow   5. Cervical radiculopathy   6. Paresthesia of skin   7. Degenerative disc disease, cervical   8. Herniation of lumbar intervertebral disc with radiculopathy   9. Transient left leg weakness     Plan: Avoid bending, stooping and avoid lifting weights greater than 10 lbs. Avoid prolong standing and walking. Order for a new walker with wheels. Surgery scheduling secretary Tivis Ringer, will call you in the next week to schedule for surgery.  Surgery recommended is a left L1-2 and L2-3 level lumbar hemilaminectomy for removal of extruded disc herniation. this would be done with microscope. Take hydrocodone for for pain. Risk of surgery includes risk of infection 1 in 200 patients, bleeding 08/998 chance you would need a transfusion.   Risk to the nerves is one in 10,000. Expect improved walking and standing tolerance. Expect relief of leg pain but numbness may persist depending on the length and degree of pressure that has been present.    Follow-Up Instructions: No follow-ups on file.   Orders:  No orders of the defined types were placed in this encounter.  No orders of the defined types were placed in this encounter.     Procedures: No procedures performed   Clinical Data: Findings:  Narrative & Impression CLINICAL DATA:  Lumbar radiculopathy, symptoms persist with > 6 wks treatment   EXAM: MRI LUMBAR SPINE WITHOUT CONTRAST   TECHNIQUE: Multiplanar, multisequence MR imaging of the lumbar spine was performed. No intravenous  contrast was administered.   COMPARISON:  August 2021   FINDINGS: Segmentation:  Standard.   Alignment:  Stable.   Vertebrae: Stable vertebral body heights with degenerative endplate irregularity. 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 No substantial marrow edema. No suspicious osseous lesion.   Conus medullaris and cauda equina: Conus extends to the L1 level. Conus and cauda equina appear normal.   Paraspinal and other soft tissues: Unremarkable.   Disc levels:   L1-L2: Disc bulge with superimposed left subarticular zone extrusion extending inferiorly to the lower L2 level. This indents the left anterolateral aspect of the thecal sac below disc level. Mild canal stenosis. Minor foraminal stenosis. Compression of left L2 nerve roots below disc level.   L2-L3: Disc bulge with endplate osteophytic ridging. Mild facet arthropathy. Moderate canal stenosis. Mild foraminal stenosis.   L3-L4: Disc bulge with endplate osteophytic ridging. Facet arthropathy with ligamentum flavum infolding. Mild to moderate canal stenosis. Mild to moderate right foraminal stenosis. Mild left foraminal stenosis.   L4-L5: Disc bulge with endplate osteophytic ridging. Facet arthropathy with ligamentum flavum infolding. Mild canal stenosis. Mild to moderate foraminal stenosis.   L5-S1: Disc bulge. Facet arthropathy. No canal or foraminal stenosis.   IMPRESSION: Multilevel degenerative changes as detailed above. Most notably, there is a disc extrusion at L1-L2 with compression of traversing left L2 nerve roots below disc level.     Electronically  Signed   By: Guadlupe Spanish M.D.   On: 03/31/2022 09:50      Subjective: Chief Complaint  Patient presents with   Lower Back - Pain, Follow-up   Neck - Pain, Follow-up    61 year old male with history of cervical spondylosis and lumbar DDD and spondylosis. He is on disability from injury while in an elevator with fall resulting in  lumbar and cervical injuries. Underwent left sided cervical foramenotomies 07/22/2021 with good improvement in left arm pain and weakness. Has right arm pain and weakness intermittantly and posterior neck pain that is severe and related to sleeping. Right C5-6 and C6-7 Side. He uses 2 pillows.    Review of Systems  Constitutional: Negative.   HENT: Negative.    Eyes: Negative.   Respiratory: Negative.    Cardiovascular: Negative.   Gastrointestinal: Negative.   Endocrine: Negative.   Genitourinary: Negative.   Musculoskeletal: Negative.   Skin: Negative.   Allergic/Immunologic: Negative.   Neurological: Negative.   Hematological: Negative.   Psychiatric/Behavioral: Negative.       Objective: Vital Signs: BP 132/89   Pulse 65   Ht 5\' 9"  (1.753 m)   Wt 216 lb (98 kg)   BMI 31.90 kg/m   Physical Exam Constitutional:      Appearance: He is well-developed.  HENT:     Head: Normocephalic and atraumatic.  Eyes:     Pupils: Pupils are equal, round, and reactive to light.  Pulmonary:     Effort: Pulmonary effort is normal.     Breath sounds: Normal breath sounds.  Abdominal:     General: Bowel sounds are normal.     Palpations: Abdomen is soft.  Musculoskeletal:        General: Normal range of motion.     Cervical back: Normal range of motion and neck supple.  Skin:    General: Skin is warm and dry.  Neurological:     Mental Status: He is alert and oriented to person, place, and time.  Psychiatric:        Behavior: Behavior normal.        Thought Content: Thought content normal.        Judgment: Judgment normal.     Ortho Exam  Specialty Comments:  No specialty comments available.  Imaging: No results found.   PMFS History: Patient Active Problem List   Diagnosis Date Noted   Other spondylosis with radiculopathy, cervical region 07/22/2021   Complete tear of right rotator cuff    Biceps tendonitis on right    Degenerative superior labral  anterior-to-posterior (SLAP) tear of right shoulder    Lumbar radiculopathy 09/28/2016   Past Medical History:  Diagnosis Date   Arthritis    knees, lower back   Complication of anesthesia    Hard time waking up   GERD (gastroesophageal reflux disease)    no current problem - no meds   HLD (hyperlipidemia)    no meds - diet controlled   Hypertension    Sleep apnea    does not use cpap    History reviewed. No pertinent family history.  Past Surgical History:  Procedure Laterality Date   ANTERIOR CERVICAL DECOMP/DISCECTOMY FUSION N/A 10/05/2012   Procedure: ANTERIOR CERVICAL DECOMPRESSION/DISCECTOMY FUSION 1 LEVEL;  Surgeon: 12/05/2012, MD;  Location: [facility] OR;  Service: Orthopedics;  Laterality: N/A;  TOTAL DISC REPLACEMENT VERSES ACDF C4-5   EYE SURGERY Bilateral    Lasik   KNEE ARTHROSCOPY  left   KNEE ARTHROSCOPY     on left side again   POSTERIOR CERVICAL FUSION/FORAMINOTOMY N/A 07/22/2021   Procedure: Right C5-6 and Right C6--7 Foraminotomies;  Surgeon: Kerrin Champagne, MD;  Location: [facility] OR;  Service: Orthopedics;  Laterality: N/A;   ROTATOR CUFF REPAIR     left   ROTATOR CUFF REPAIR     2nd time around (after fall)   SHOULDER ARTHROSCOPY WITH SUBACROMIAL DECOMPRESSION AND BICEP TENDON REPAIR Right 03/13/2021   Procedure: SHOULDER ARTHROSCOPY WITH SUBACROMIAL DECOMPRESSION, MNIN OPEN ROTATOR CUFF TEAR REPAIR,  BICEPS TENODESIS;  Surgeon: Cammy Copa, MD;  Location: [facility] OR;  Service: Orthopedics;  Laterality: Right;   Social History   Occupational History   Not on file  Tobacco Use   Smoking status: Never   Smokeless tobacco: Never  Vaping Use   Vaping Use: Never used  Substance and Sexual Activity   Alcohol use: No    Comment: occasional beer - twice month   Drug use: No   Sexual activity: Not on file

## 2022-04-15 NOTE — Patient Instructions (Addendum)
Avoid bending, stooping and avoid lifting weights greater than 10 lbs. Avoid prolong standing and walking. Order for a new walker with wheels. Surgery scheduling secretary Tivis Ringer, will call you in the next week to schedule for surgery.  Surgery recommended is a left L1-2 and L2-3 level lumbar hemilaminectomy for removal of extruded disc herniation. this would be done with microscope. Take hydrocodone for for pain. Risk of surgery includes risk of infection 1 in 200 patients, bleeding 08/998 chance you would need a transfusion.   Risk to the nerves is one in 10,000. Expect improved walking and standing tolerance. Expect relief of leg pain but numbness may persist depending on the length and degree of pressure that has been present.

## 2022-04-28 MED ORDER — BACLOFEN 10 MG PO TABS: 10.0000 mg | ORAL_TABLET | Freq: Three times a day (TID) | ORAL | 0 refills | Status: AC

## 2022-04-29 ENCOUNTER — Telehealth: Payer: Self-pay

## 2022-04-29 NOTE — Telephone Encounter (Signed)
Dr. Louanne Skye recommended surgery at last Visit.  I called patient to discuss scheduling him with Dr. Laurance Flatten for a visit to discuss him possibly doing surgery vs. Dr. Louanne Skye referring patient to another surgeon.  I told him Dr. Louanne Skye may be able to participate in surgery depending on timing of when he would see Dr. Laurance Flatten.  Patient asked if Dr. Louanne Skye would call him to further discuss.  He is not sure how he wants to proceed.  He's not totally sure he wants surgery.  Please call [phone].

## 2022-06-11 ENCOUNTER — Ambulatory Visit: Payer: Medicare HMO | Admitting: Orthopedic Surgery

## 2022-06-11 ENCOUNTER — Telehealth: Payer: Self-pay | Admitting: Orthopedic Surgery

## 2022-06-11 NOTE — Telephone Encounter (Signed)
Pt returning call... Pt stated that someone called him and left VM to callback to reschedule surgery appt.... Pt requesting callback

## 2022-06-11 NOTE — Telephone Encounter (Signed)
I called patient back to reschedule appointment. Patient did not answer. I left a message for patient to call back once available.

## 2022-06-11 NOTE — Telephone Encounter (Signed)
Scheduled 06/16/22

## 2022-06-16 ENCOUNTER — Ambulatory Visit (INDEPENDENT_AMBULATORY_CARE_PROVIDER_SITE_OTHER): Payer: Medicare HMO | Admitting: Orthopedic Surgery

## 2022-06-16 ENCOUNTER — Ambulatory Visit (INDEPENDENT_AMBULATORY_CARE_PROVIDER_SITE_OTHER): Payer: Medicare HMO

## 2022-06-16 ENCOUNTER — Encounter: Payer: Self-pay | Admitting: Orthopedic Surgery

## 2022-06-16 VITALS — BP 115/73 | HR 78 | Ht 69.0 in | Wt 220.0 lb

## 2022-06-16 DIAGNOSIS — M5416 Radiculopathy, lumbar region: Secondary | ICD-10-CM

## 2022-06-16 NOTE — Progress Notes (Signed)
Orthopedic Spine Surgery Office Note  Assessment: Patient is a 61 y.o. male with chronic, stable low back pain and bilateral leg pain (L>R). The majority of his pain is in his low back. His leg pain is not that bad and usually subsides. Has significant lateral recess stenosis at L2/3 and L3/4.    Plan: -Explained that initially conservative treatment is tried as a significant number of patients may experience relief with these treatment modalities. Discussed that the conservative treatments include:  -activity modification  -physical therapy  -over the counter pain medications  -medrol dosepak  -lumbar steroid injections -Patient has tried chiropractor, activity modification, injections, tylenol, NSAIDs  -Recommended continued symptomatic management since the majority of his pain is located in his lower back. Explained that surgery would be to help leg pain. Offered to refer him to pain management, but he is not interested in taking medications chronically -If he needs another injection to help with his pain, will refer him to Dr. Alvester Morin -Patient should return to office on an as needed basis   Patient expressed understanding of the plan and all questions were answered to the patient's satisfaction.   ___________________________________________________________________________   History:  Patient is a 61 y.o. male who presents today for lumbar spine. Patient has a long history of low back issues. States it started after a workplace accident. Pain is felt in his low back and is worse with activity. It improves with rest. He does have some pain that radiates into his bilateral legs, but that is not nearly as significant and almost always subsides. He has not had any recent trauma or injuries to his back.    Weakness: yes, feels his back is weaker. No other weakness Symptoms of imbalance: denies Paresthesias and numbness: denies Bowel or bladder incontinence: denies Saddle anesthesia:  denies  Treatments tried: chiropractor, activity modification, injections, tylenol, NSAIDs   Review of systems: Denies fevers and chills, night sweats, unexplained weight loss, history of cancer. Has had pain that wakes him at night.   Past medical history: HTN Chronic pain  Allergies: NKDA  Past surgical history:  Cervical spine surgery (both anterior and posterior) Bilateral knee arthroscopy Lasik eye surgery Left rotator cuff repair Right rotator cuff repair  Social history: Denies use of nicotine product (smoking, vaping, patches, smokeless) Alcohol use: denies Denies recreational drug use   Physical Exam:  General: no acute distress, appears stated age Neurologic: alert, answering questions appropriately, following commands Respiratory: unlabored breathing on room air, symmetric chest rise Psychiatric: appropriate affect, normal cadence to speech   MSK (spine):  -Strength exam      Left  Right EHL    5/5  5/5 TA    5/5  5/5 GSC    5/5  5/5 Knee extension  5/5  5/5 Hip flexion   5/5  5/5  -Sensory exam    Sensation intact to light touch in L3-S1 nerve distributions of bilateral lower extremities  -Achilles DTR: 1/4 on the left, 1/4 on the right -Patellar tendon DTR: 1/4 on the left, 1/4 on the right  -Straight leg raise: negative -Contralateral straight leg raise: negative -Clonus: no beats bilaterally  -Left hip exam: no pain through range of motion, negative stinchfield -Right hip exam: no pain through range of motion, negative stinchfield  Imaging: XR of the lumbar spine from 06/16/2022 was independently reviewed and interpreted, showing no fracture or dislocation. Disc height loss at multiple levels with anterior osteophyte formation.   MRI of the lumbar spine from  03/29/2022 was independently reviewed and interpreted, showing L1/2 paracentral disc herniation on the left. Lateral recess stenosis at L2/3. Lateral recess stenosis at L3/4.   He  brought in a disc with an MRI lumbar spine with and without contrast from 06/2022 that showed that the L1/2 disc herniation had resolved. He still had significant lateral recess stenosis at L2/3 and L3/4.    Patient name: Erik Cook Patient MRN: [id number] Date of visit: 06/16/22

## 2022-08-21 ENCOUNTER — Telehealth: Payer: Self-pay | Admitting: Physical Medicine and Rehabilitation

## 2022-08-21 NOTE — Telephone Encounter (Signed)
Patient requesting an appt with Dr Ernestina Patches, [phone]

## 2022-08-24 ENCOUNTER — Other Ambulatory Visit: Payer: Self-pay | Admitting: Physical Medicine and Rehabilitation

## 2022-08-24 DIAGNOSIS — M5416 Radiculopathy, lumbar region: Secondary | ICD-10-CM

## 2022-08-24 NOTE — Telephone Encounter (Signed)
Spoke with patient and he is requesting another injection on the right side. He states it has been a while since the right side was done. He states it is the same type of pain. No new injuries, falls or accidents. Please advise

## 2022-08-27 ENCOUNTER — Other Ambulatory Visit (HOSPITAL_COMMUNITY): Payer: Self-pay

## 2022-09-03 ENCOUNTER — Ambulatory Visit: Payer: Self-pay

## 2022-09-03 ENCOUNTER — Ambulatory Visit (INDEPENDENT_AMBULATORY_CARE_PROVIDER_SITE_OTHER): Payer: Medicare HMO | Admitting: Physical Medicine and Rehabilitation

## 2022-09-03 VITALS — BP 121/82 | HR 80

## 2022-09-03 DIAGNOSIS — M5416 Radiculopathy, lumbar region: Secondary | ICD-10-CM | POA: Diagnosis not present

## 2022-09-03 MED ORDER — METHYLPREDNISOLONE ACETATE 80 MG/ML IJ SUSP
80.0000 mg | Freq: Once | INTRAMUSCULAR | Status: AC
  Administered 2022-09-03: 80 mg

## 2022-09-03 NOTE — Progress Notes (Signed)
Functional Pain Scale - descriptive words and definitions  Unmanageable (7)  Pain interferes with normal ADL's/nothing seems to help/sleep is very difficult/active distractions are very difficult to concentrate on. Severe range order  Average Pain 9-10   +Driver, -BT, -Dye Allergies.  Lower back pain on right that hurts all the across and radiates down the right leg

## 2022-09-03 NOTE — Patient Instructions (Signed)

## 2022-09-09 NOTE — Progress Notes (Signed)
Erik Cook - 62 y.o. male MRN [id number]  Date of birth: [birth year]  Office Visit Note: Visit Date: 09/03/2022 PCP: Jaynee Eagles, MD Referred by: Jaynee Eagles, MD  Subjective: Chief Complaint  Patient presents with   Lower Back - Pain   HPI:  Erik Cook is a 62 y.o. male who comes in today for planned repeat Right S1-2  Lumbar Transforaminal epidural steroid injection with fluoroscopic guidance.  The patient has failed conservative care including home exercise, medications, time and activity modification.  This injection will be diagnostic and hopefully therapeutic.  Please see requesting physician notes for further details and justification. Patient received more than 50% pain relief from prior injection.  Consider facet blocks, Did not help in 2018 but may have changed pain generator. Consider injection of upper lumbar given imaging findings  Referring: Barnet Pall, FNP and Dr. Basil Dess   ROS Otherwise per HPI.  Assessment & Plan: Visit Diagnoses:    ICD-10-CM   1. Lumbar radiculopathy  M54.16 XR C-ARM NO REPORT    Epidural Steroid injection    methylPREDNISolone acetate (DEPO-MEDROL) injection 80 mg      Plan: No additional findings.   Meds & Orders:  Meds ordered this encounter  Medications   methylPREDNISolone acetate (DEPO-MEDROL) injection 80 mg    Orders Placed This Encounter  Procedures   XR C-ARM NO REPORT   Epidural Steroid injection    Follow-up: Return for visit to requesting provider as needed.   Procedures: No procedures performed  S1 Lumbosacral Transforaminal Epidural Steroid Injection - Sub-Pedicular Approach with Fluoroscopic Guidance   Patient: Erik Cook      Date of Birth: [birth year] MRN: [id number] PCP: Jaynee Eagles, MD      Visit Date: 09/03/2022   Universal Protocol:    Date/Time: 02/07/248:32 AM  Consent Given By: the patient  Position:  PRONE  Additional Comments: Vital signs were monitored before and after  the procedure. Patient was prepped and draped in the usual sterile fashion. The correct patient, procedure, and site was verified.   Injection Procedure Details:  Procedure Site One Meds Administered:  Meds ordered this encounter  Medications   methylPREDNISolone acetate (DEPO-MEDROL) injection 80 mg    Laterality: Right  Location/Site:  S1 Foramen   Needle size: 22 ga.  Needle type: Spinal  Needle Placement: Transforaminal  Findings:   -Comments: Excellent flow of contrast along the nerve, nerve root and into the epidural space.  Epidurogram: Contrast epidurogram showed no nerve root cut off or restricted flow pattern.  Procedure Details: After squaring off the sacral end-plate to get a true AP view, the C-arm was positioned so that the best possible view of the S1 foramen was visualized. The soft tissues overlying this structure were infiltrated with 2-3 ml. of 1% Lidocaine without Epinephrine.    The spinal needle was inserted toward the target using a "trajectory" view along the fluoroscope beam.  Under AP and lateral visualization, the needle was advanced so it did not puncture dura. Biplanar projections were used to confirm position. Aspiration was confirmed to be negative for CSF and/or blood. A 1-2 ml. volume of Isovue-250 was injected and flow of contrast was noted at each level. Radiographs were obtained for documentation purposes.   After attaining the desired flow of contrast documented above, a 0.5 to 1.0 ml test dose of 0.25% Marcaine was injected into each respective transforaminal space.  The patient was observed for 90 seconds post injection.  After no  sensory deficits were reported, and normal lower extremity motor function was noted,   the above injectate was administered so that equal amounts of the injectate were placed at each foramen (level) into the transforaminal epidural space.   Additional Comments:  No complications occurred Dressing: Band-Aid with  2 x 2 sterile gauze    Post-procedure details: Patient was observed during the procedure. Post-procedure instructions were reviewed.  Patient left the clinic in stable condition.   Clinical History: MRI LUMBAR SPINE WITHOUT CONTRAST   TECHNIQUE: Multiplanar, multisequence MR imaging of the lumbar spine was performed. No intravenous contrast was administered.   COMPARISON:  August 2021   FINDINGS: Segmentation:  Standard.   Alignment:  Stable.   Vertebrae: Stable vertebral body heights with degenerative endplate irregularity. 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 No substantial marrow edema. No suspicious osseous lesion.   Conus medullaris and cauda equina: Conus extends to the L1 level. Conus and cauda equina appear normal.   Paraspinal and other soft tissues: Unremarkable.   Disc levels:   L1-L2: Disc bulge with superimposed left subarticular zone extrusion extending inferiorly to the lower L2 level. This indents the left anterolateral aspect of the thecal sac below disc level. Mild canal stenosis. Minor foraminal stenosis. Compression of left L2 nerve roots below disc level.   L2-L3: Disc bulge with endplate osteophytic ridging. Mild facet arthropathy. Moderate canal stenosis. Mild foraminal stenosis.   L3-L4: Disc bulge with endplate osteophytic ridging. Facet arthropathy with ligamentum flavum infolding. Mild to moderate canal stenosis. Mild to moderate right foraminal stenosis. Mild left foraminal stenosis.   L4-L5: Disc bulge with endplate osteophytic ridging. Facet arthropathy with ligamentum flavum infolding. Mild canal stenosis. Mild to moderate foraminal stenosis.   L5-S1: Disc bulge. Facet arthropathy. No canal or foraminal stenosis.   IMPRESSION: Multilevel degenerative changes as detailed above. Most notably, there is a disc extrusion at L1-L2 with compression of traversing left L2 nerve roots below disc level.     Electronically  Signed   By: Macy Mis M.D.   On: 03/31/2022 09:50     Objective:  VS:  HT:    WT:   BMI:     BP:121/82  HR:80bpm  TEMP: ( )  RESP:  Physical Exam Vitals and nursing note reviewed.  Constitutional:      General: He is not in acute distress.    Appearance: Normal appearance. He is not ill-appearing.  HENT:     Head: Normocephalic and atraumatic.     Right Ear: External ear normal.     Left Ear: External ear normal.     Nose: No congestion.  Eyes:     Extraocular Movements: Extraocular movements intact.  Cardiovascular:     Rate and Rhythm: Normal rate.     Pulses: Normal pulses.  Pulmonary:     Effort: Pulmonary effort is normal. No respiratory distress.  Abdominal:     General: There is no distension.     Palpations: Abdomen is soft.  Musculoskeletal:        General: No tenderness or signs of injury.     Cervical back: Neck supple.     Right lower leg: No edema.     Left lower leg: No edema.     Comments: Patient has good distal strength without clonus.  Skin:    Findings: No erythema or rash.  Neurological:     General: No focal deficit present.     Mental Status: He is alert and oriented  to person, place, and time.     Sensory: No sensory deficit.     Motor: No weakness or abnormal muscle tone.     Coordination: Coordination normal.  Psychiatric:        Mood and Affect: Mood normal.        Behavior: Behavior normal.      Imaging: No results found.

## 2022-09-09 NOTE — Procedures (Signed)
S1 Lumbosacral Transforaminal Epidural Steroid Injection - Sub-Pedicular Approach with Fluoroscopic Guidance   Patient: Erik Cook      Date of Birth: 07/03/1961 MRN: [id number] PCP: Jaynee Eagles, MD      Visit Date: 09/03/2022   Universal Protocol:    Date/Time: 02/07/248:32 AM  Consent Given By: the patient  Position:  PRONE  Additional Comments: Vital signs were monitored before and after the procedure. Patient was prepped and draped in the usual sterile fashion. The correct patient, procedure, and site was verified.   Injection Procedure Details:  Procedure Site One Meds Administered:  Meds ordered this encounter  Medications   methylPREDNISolone acetate (DEPO-MEDROL) injection 80 mg    Laterality: Right  Location/Site:  S1 Foramen   Needle size: 22 ga.  Needle type: Spinal  Needle Placement: Transforaminal  Findings:   -Comments: Excellent flow of contrast along the nerve, nerve root and into the epidural space.  Epidurogram: Contrast epidurogram showed no nerve root cut off or restricted flow pattern.  Procedure Details: After squaring off the sacral end-plate to get a true AP view, the C-arm was positioned so that the best possible view of the S1 foramen was visualized. The soft tissues overlying this structure were infiltrated with 2-3 ml. of 1% Lidocaine without Epinephrine.    The spinal needle was inserted toward the target using a "trajectory" view along the fluoroscope beam.  Under AP and lateral visualization, the needle was advanced so it did not puncture dura. Biplanar projections were used to confirm position. Aspiration was confirmed to be negative for CSF and/or blood. A 1-2 ml. volume of Isovue-250 was injected and flow of contrast was noted at each level. Radiographs were obtained for documentation purposes.   After attaining the desired flow of contrast documented above, a 0.5 to 1.0 ml test dose of 0.25% Marcaine was injected into each  respective transforaminal space.  The patient was observed for 90 seconds post injection.  After no sensory deficits were reported, and normal lower extremity motor function was noted,   the above injectate was administered so that equal amounts of the injectate were placed at each foramen (level) into the transforaminal epidural space.   Additional Comments:  No complications occurred Dressing: Band-Aid with 2 x 2 sterile gauze    Post-procedure details: Patient was observed during the procedure. Post-procedure instructions were reviewed.  Patient left the clinic in stable condition.

## 2022-10-21 ENCOUNTER — Ambulatory Visit (INDEPENDENT_AMBULATORY_CARE_PROVIDER_SITE_OTHER): Payer: Medicare HMO | Admitting: Orthopedic Surgery

## 2022-10-21 ENCOUNTER — Other Ambulatory Visit (INDEPENDENT_AMBULATORY_CARE_PROVIDER_SITE_OTHER): Payer: Medicare HMO

## 2022-10-21 DIAGNOSIS — M5030 Other cervical disc degeneration, unspecified cervical region: Secondary | ICD-10-CM | POA: Diagnosis not present

## 2022-10-21 MED ORDER — METHYLPREDNISOLONE 4 MG PO TBPK: ORAL_TABLET | ORAL | 0 refills | Status: DC

## 2022-10-21 NOTE — Progress Notes (Signed)
Orthopedic Spine Surgery Office Note  Assessment: Patient is a 62 y.o. male with 2 issues:  1) low back pain with no radicular symptoms 2) neck pain that radiates into the right upper extremity, possible adjacent segment disease with radiculopathy   Plan: -Explained that initially conservative treatment is tried as a significant number of patients may experience relief with these treatment modalities. Discussed that the conservative treatments include:  -activity modification  -physical therapy  -over the counter pain medications  -medrol dosepak  -cervical steroid injections -Patient has tried no specific treatment for his cervical radicular type pain.  Recommended Tylenol and Medrol Dosepak.  Prescribed Medrol Dosepak today -In regards to his low back, his symptoms are chronic and he has no radiating leg pain.  I recommended core strengthening and weight loss.  He can also try heat and ice to the area.  -If his radiating pain in his upper extremity is not better in 6 weeks, will order MRI -Patient should return to office in 6 weeks, x-rays at next visit: None   Patient expressed understanding of the plan and all questions were answered to the patient's satisfaction.   __________________________________________________________________________  History: Patient is a 62 y.o. male who comes in today to talk about two issues. First he wanted to talk about his neck.  Patient has a history of anterior and posterior cervical spine surgery.  His most recent surgery was a C5/6 and C6/7 right-sided foraminotomies in 07/2021.  He states within the last month he has noticed pain starting in his neck rating down his right upper extremity.  The majority of pain is in his neck on the right side.  In the upper extremity, it goes into the shoulder, lateral arm, and dorsal forearm.  Sometimes it radiates into the fingers particularly the index and middle finger.  No symptoms on the left side.  Has not  noticed any issues with fine motor skills or imbalance.  No saddle anesthesia.  No bowel or bladder incontinence.  In regards to his low back, he has had chronic low back pain.  He states it is worse in the morning especially when getting out of bed.  He has no pain radiating into either lower extremity.  Pain is worse with activity or when laying completely flat.  It gets better if he sits down.  Denies paresthesias and numbness.   Previous treatments: chiropractor, activity modification, injections, tylenol, NSAIDs   Physical Exam:  General: no acute distress, appears stated age Neurologic: alert, answering questions appropriately, following commands Respiratory: unlabored breathing on room air, symmetric chest rise Psychiatric: appropriate affect, normal cadence to speech   MSK (spine):  -Strength exam      Left  Right Grip strength                5/5  5/5 Interosseus   5/5   5/5 Wrist extension  5/5  5/5 Wrist flexion   5/5  5/5 Elbow flexion   5/5  5/5 Deltoid    5/5  5/5  EHL    5/5  5/5 TA    5/5  5/5 GSC    5/5  5/5 Knee extension  5/5  5/5 Hip flexion   5/5  5/5  -Sensory exam    Sensation intact to light touch in L3-S1 nerve distributions of bilateral lower extremities  Sensation intact to light touch in C5-T1 nerve distributions of bilateral upper extremities  -Brachioradialis DTR: 1/4 on the left, 1/4 on the right -Biceps DTR:  1/4 on the left, 1/4 on the right -Achilles DTR: 1/4 on the left, 1/4 on the right -Patellar tendon DTR: 1/4 on the left, 1/4 on the right  -Spurling: Negative bilaterally -Hoffman sign: Negative bilaterally -Clonus: No beats bilaterally -Interosseous wasting: None seen -Grip and release test: Negative -Romberg: Negative -Gait: Normal  Right shoulder exam: no pain through range of motion, negative Jobe test, negative drop arm sign, negative belly press, no weakness with external rotation with arm at side Left shoulder exam: No  pain through range of motion  Tinel's at wrist: Negative bilaterally Phalen's at wrist: Negative bilaterally Durkan's: Negative bilaterally  Tinel's at elbow: Negative bilaterally  Imaging: XR of the cervical spine from 10/21/2022 was independently reviewed and interpreted, showing ACDF with anterior instrumentation at C4/5. Interspinous motion between C4 and C5 less than 1 mm suggesting successful fusion.  No lucency around the anterior instrumentation.  Facet arthropathy at C5/6.  Disc height loss and anterior osteophyte formation at C5/6 and C6/7.  No evidence of instability on flexion/extension views.  No fracture or dislocation seen.  XR of the lumbar spine from 06/16/2022 was previously independently reviewed and interpreted, showing no fracture or dislocation. Disc height loss at multiple levels with anterior osteophyte formation.     MRI of the lumbar spine from 03/29/2022 was independently reviewed and interpreted, showing L1/2 paracentral disc herniation on the left. Lateral recess stenosis at L2/3. Lateral recess stenosis at L3/4.    He brought in a disc with an MRI lumbar spine with and without contrast from 06/2022 that showed that the L1/2 disc herniation had resolved. He still had significant lateral recess stenosis at L2/3 and L3/4.   Patient name: Erik Cook Patient MRN: [id number] Date of visit: 10/21/22

## 2022-12-02 ENCOUNTER — Ambulatory Visit: Payer: Medicare HMO | Admitting: Orthopedic Surgery

## 2022-12-02 DIAGNOSIS — M542 Cervicalgia: Secondary | ICD-10-CM | POA: Diagnosis not present

## 2022-12-02 NOTE — Progress Notes (Signed)
Orthopedic Spine Surgery Office Note  Assessment: Patient is a 62 y.o. male with 2 issues:  1) chronic, stable low back pain with no radicular symptoms 2) cervical radiculopathy that has improved but still has neck stiffness and pain in the right paraspinal muscles   Plan: -Patient has tried  chiropractor, activity modification, injections, tylenol, NSAIDs  -Recommended physical therapy for his cervical spine to work on range of motion and dry needling -In regards to his lower lumbar spine, he should continue with his home exercise program and core strengthening -Patient should return to office in 8 weeks, x-rays at next visit: None   Patient expressed understanding of the plan and all questions were answered to the patient's satisfaction.   __________________________________________________________________________  History: Patient is a 62 y.o. male who has been previously seen in the office for neck pain radiating into his right upper extremity.  He states that since I last saw him, his radiating arm pain has gotten significantly better.  He estimates that is about 75% better.  He is still having neck pain and stiffness.  He feels the neck pain particularly on the right side in the region of the paraspinal muscles.  It does not radiate any further than the paraspinal muscles.  He describes his pain as sharp.  He also wanted to talk about his low back.  He has chronic low back pain that has been bothering him.  He states that his neck and back alternate in terms of which 1 is hurting more on any given day.  He feels that the pain is worse with activity and improves with rest.  He has trouble doing things like hiking which he likes to do in the past because of the pain.  He does not have any radiating pain in either lower extremity.  No saddle anesthesia.  No bowel or bladder incontinence  Previous treatments:  chiropractor, activity modification, injections, tylenol, NSAIDs    Physical  Exam:  General: no acute distress, appears stated age Neurologic: alert, answering questions appropriately, following commands Respiratory: unlabored breathing on room air, symmetric chest rise Psychiatric: appropriate affect, normal cadence to speech   MSK (spine):  -Strength exam      Left  Right Grip strength                5/5  5/5 Interosseus   5/5   5/5 Wrist extension  5/5  5/5 Wrist flexion   5/5  5/5 Elbow flexion   5/5  5/5 Deltoid    5/5  5/5  EHL    5/5  5/5 TA    5/5  5/5 GSC    5/5  5/5 Knee extension  5/5  5/5 Hip flexion   5/5  5/5  -Sensory exam    Sensation intact to light touch in L3-S1 nerve distributions of bilateral lower extremities  Sensation intact to light touch in C5-T1 nerve distributions of bilateral upper extremities  Imaging: XR of the cervical spine from 10/21/2022 was previously independently reviewed and interpreted, showing ACDF with anterior instrumentation at C4/5. Interspinous motion between C4 and C5 less than 1 mm suggesting successful fusion.  No lucency around the anterior instrumentation.  Facet arthropathy at C5/6.  Disc height loss and anterior osteophyte formation at C5/6 and C6/7.  No evidence of instability on flexion/extension views.  No fracture or dislocation seen.   XR of the lumbar spine from 06/16/2022 was previously independently reviewed and interpreted, showing no fracture or dislocation. Disc height loss  at multiple levels with anterior osteophyte formation.     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 Lateral recess stenosis at L2/3. Lateral recess stenosis at L3/4.    He brought in a disc with an MRI lumbar spine with and without contrast from 06/2022 that showed that the L1/2 disc herniation had resolved. He still had significant lateral recess stenosis at L2/3 and L3/4.    Patient name: Erik Cook Patient  MRN: [id number] Date of visit: 12/02/22

## 2023-02-03 ENCOUNTER — Ambulatory Visit (INDEPENDENT_AMBULATORY_CARE_PROVIDER_SITE_OTHER): Payer: Medicare HMO | Admitting: Orthopedic Surgery

## 2023-02-03 DIAGNOSIS — M25512 Pain in left shoulder: Secondary | ICD-10-CM | POA: Diagnosis not present

## 2023-02-03 NOTE — Progress Notes (Signed)
Orthopedic Spine Surgery Office Note   Assessment: Patient is a 62 y.o. male with 2 issues:   1) chronic, stable low back pain with no radicular symptoms 2) cervical radiculopathy that has improved on the right side but now he is having more left sided symptoms     Plan: -Patient has tried  chiropractor, activity modification, injections, tylenol, NSAIDs  -He feels that some of his pain is coming from the shoulder on the left side which I told him I agreed with based off his exam. I discussed an injection to help with some of his pain and that would also help [place] figure out how much is shoulder and how much is radicular pain into that arm -I told him that if he does not get adequate relief with the shoulder injection, then he should call the office and I will refer him to Dr. Alvester Morin for cervical ESI -In regards to his lower lumbar spine, he should continue with his home exercise program and core strengthening -Patient should return to office in 12 weeks, x-rays at next visit: None     Left shoulder injection note: After discussing the risks, benefits, and alternatives of left shoulder injection, patient elected to proceed. The lateral aspect of the shoulder just distal to the acromion was prepped with alcohol based prep. Ethyl chloride was used to anesthetize the area. A 20 gauge needle was used to inject 1cc of depomedrol, 1cc of bupivacaine, 1cc of lidocaine under standard sterile technique. Needle was withdrawn and band aid was applied. Patient tolerated the procedure well.    __________________________________________________________________________   History: Patient is a 62 y.o. male who has been previously seen in the office for neck pain radiating into his right upper extremity and low back pain. His back pain has been stable since the last time he was seen. He is not having any pain radiating into either lower extremity. Feels pain on a daily basis. In regards to his neck, he says that  his right sided symptoms nearly completely resolved but he has now developed left sided symptoms. He feels the pain starts in his neck and radiates into his left shoulder, left arm, and left dorsal forearm. He does feel increased shoulder pain with motion.    Previous treatments:  chiropractor, activity modification, injections, tylenol, NSAIDs      Physical Exam:   General: no acute distress, appears stated age Neurologic: alert, answering questions appropriately, following commands Respiratory: unlabored breathing on room air, symmetric chest rise Psychiatric: appropriate affect, normal cadence to speech     MSK (spine):   -Strength exam                                                   Left                  Right Grip strength                5/5                  5/5 Interosseus                  5/5                  5/5 Wrist extension  5/5                  5/5 Wrist flexion                 5/5                  5/5 Elbow flexion                5/5                  5/5 Deltoid                          5/5                  5/5   EHL                              5/5                  5/5 TA                                 5/5                  5/5 GSC                             5/5                  5/5 Knee extension            5/5                  5/5 Hip flexion                    5/5                  5/5   -Sensory exam                           Sensation intact to light touch in L3-S1 nerve distributions of bilateral lower extremities             Sensation intact to light touch in C5-T1 nerve distributions of bilateral upper extremities   Left shoulder exam: pain with jobe but no weakness, pain with external rotation past 70 degrees, pain with internal rotation past 60 degrees, no weakness with external rotation with arm at side, negative belly press  Imaging: XR of the cervical spine from 10/21/2022 was previously independently reviewed and interpreted, showing ACDF  with anterior instrumentation at C4/5. Interspinous motion between C4 and C5 less than 1 mm suggesting successful fusion.  No lucency around the anterior instrumentation.  Facet arthropathy at C5/6.  Disc height loss and anterior osteophyte formation at C5/6 and C6/7.  No evidence of instability on flexion/extension views.  No fracture or dislocation seen.   XR of the lumbar spine from 06/16/2022 was previously independently reviewed and interpreted, showing no fracture or dislocation. Disc height loss at multiple levels with anterior osteophyte formation.     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 Lateral recess stenosis at L2/3. Lateral  recess stenosis at L3/4.    MRI lumbar spine (on disc) with and without contrast from 06/2022 was previously reviewed and showed L1/2 disc herniation had resolved. He still had significant lateral recess stenosis at L2/3 and L3/4.      Patient name: Erik Cook Patient MRN: [id number] Date of visit: 02/03/23

## 2023-02-03 NOTE — Progress Notes (Signed)
Orthopedic Spine Surgery Office Note   Assessment: Patient is a 62 y.o. male with 2 issues:   1) chronic, stable low back pain with no radicular symptoms 2) cervical radiculopathy that has improved on the right side but now he is having more left sided symptoms     Plan: -Patient has tried  chiropractor, activity modification, injections, tylenol, NSAIDs  -He feels that some of his pain is coming from the shoulder on the left side which I told him I agreed with based off his exam. I discussed an injection to help with some of his pain and that would also help us figure out how much is shoulder and how much is radicular pain into that arm -I told him that if he does not get adequate relief with the shoulder injection, then he should call the office and I will refer him to Dr. Newton for cervical ESI -In regards to his lower lumbar spine, he should continue with his home exercise program and core strengthening -Patient should return to office in 12 weeks, x-rays at next visit: None     Left shoulder injection note: After discussing the risks, benefits, and alternatives of left shoulder injection, patient elected to proceed. The lateral aspect of the shoulder just distal to the acromion was prepped with alcohol based prep. Ethyl chloride was used to anesthetize the area. A 20 gauge needle was used to inject 1cc of depomedrol, 1cc of bupivacaine, 1cc of lidocaine under standard sterile technique. Needle was withdrawn and band aid was applied. Patient tolerated the procedure well.    __________________________________________________________________________   History: Patient is a 62 y.o. male who has been previously seen in the office for neck pain radiating into his right upper extremity and low back pain. His back pain has been stable since the last time he was seen. He is not having any pain radiating into either lower extremity. Feels pain on a daily basis. In regards to his neck, he says that  his right sided symptoms nearly completely resolved but he has now developed left sided symptoms. He feels the pain starts in his neck and radiates into his left shoulder, left arm, and left dorsal forearm. He does feel increased shoulder pain with motion.    Previous treatments:  chiropractor, activity modification, injections, tylenol, NSAIDs      Physical Exam:   General: no acute distress, appears stated age Neurologic: alert, answering questions appropriately, following commands Respiratory: unlabored breathing on room air, symmetric chest rise Psychiatric: appropriate affect, normal cadence to speech     MSK (spine):   -Strength exam                                                   Left                  Right Grip strength                5/5                  5/5 Interosseus                  5/5                  5/5 Wrist extension              5/5                  5/5 Wrist flexion                 5/5                  5/5 Elbow flexion                5/5                  5/5 Deltoid                          5/5                  5/5   EHL                              5/5                  5/5 TA                                 5/5                  5/5 GSC                             5/5                  5/5 Knee extension            5/5                  5/5 Hip flexion                    5/5                  5/5   -Sensory exam                           Sensation intact to light touch in L3-S1 nerve distributions of bilateral lower extremities             Sensation intact to light touch in C5-T1 nerve distributions of bilateral upper extremities   Left shoulder exam: pain with jobe but no weakness, pain with external rotation past 70 degrees, pain with internal rotation past 60 degrees, no weakness with external rotation with arm at side, negative belly press  Imaging: XR of the cervical spine from 10/21/2022 was previously independently reviewed and interpreted, showing ACDF  with anterior instrumentation at C4/5. Interspinous motion between C4 and C5 less than 1 mm suggesting successful fusion.  No lucency around the anterior instrumentation.  Facet arthropathy at C5/6.  Disc height loss and anterior osteophyte formation at C5/6 and C6/7.  No evidence of instability on flexion/extension views.  No fracture or dislocation seen.   XR of the lumbar spine from 06/16/2022 was previously independently reviewed and interpreted, showing no fracture or dislocation. Disc height loss at multiple levels with anterior osteophyte formation.     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 Lateral recess stenosis at L2/3. Lateral   recess stenosis at L3/4.    MRI lumbar spine (on disc) with and without contrast from 06/2022 was previously reviewed and showed L1/2 disc herniation had resolved. He still had significant lateral recess stenosis at L2/3 and L3/4.      Patient name: Erik Cook Patient MRN: [id number] Date of visit: 02/03/23 

## 2023-02-12 IMAGING — MR MR CERVICAL SPINE W/O CM
5 of 6 series · 28 of 48 positions shown · non-contrast
Comparison: 03/18/2020

CLINICAL DATA: Cervical pain for years. No known injury. Prior neck
surgery in 2154.

EXAM:
MRI CERVICAL SPINE WITHOUT CONTRAST
TECHNIQUE: Multiplanar, multisequence MR imaging of the cervical spine was
performed. No intravenous contrast was administered.

[Series 2: T2 · sagittal · 3.0mm · 0.69mm/px · 5 of 15 slices shown (1 of 2)]
[im 1/15]
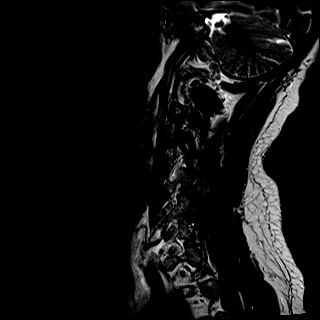
[im 4/15]
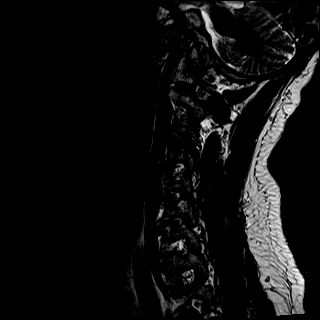
[im 8/15]
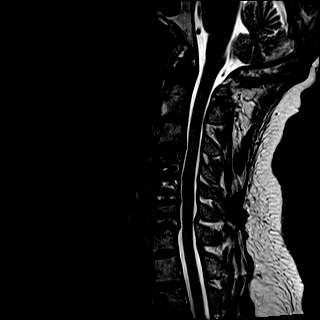
[im 11/15]
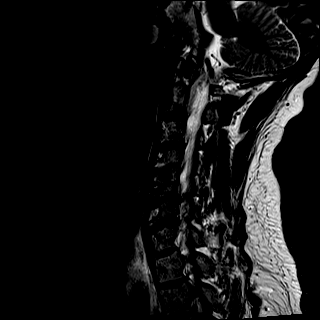
[im 15/15]
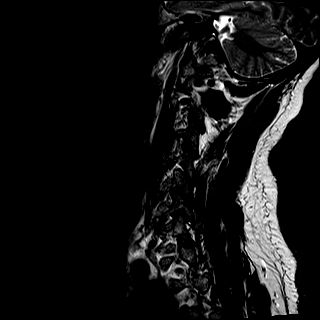

[Series 3: T1 · sagittal · 3.0mm · 0.86mm/px · 6 of 15 slices shown]
[im 1/15]
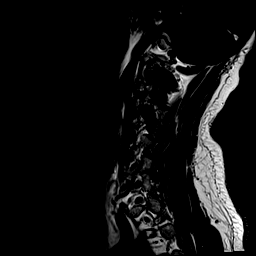
[im 3/15]
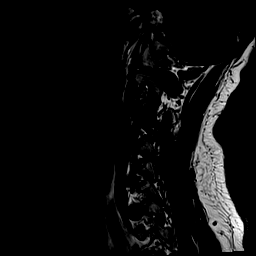
[im 6/15]
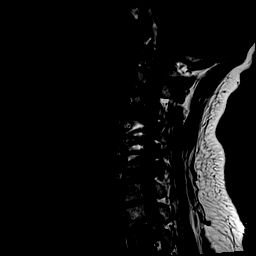
[im 9/15]
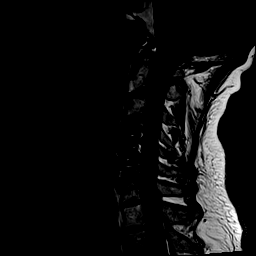
[im 12/15]
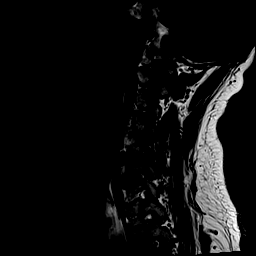
[im 15/15]
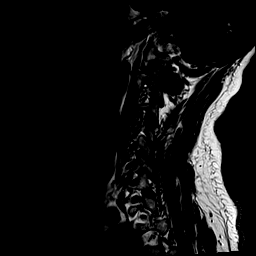

[Series 4: STIR · sagittal · 3.0mm · 0.34mm/px · 6 of 14 slices shown (1 of 2)]
[im 1/14]
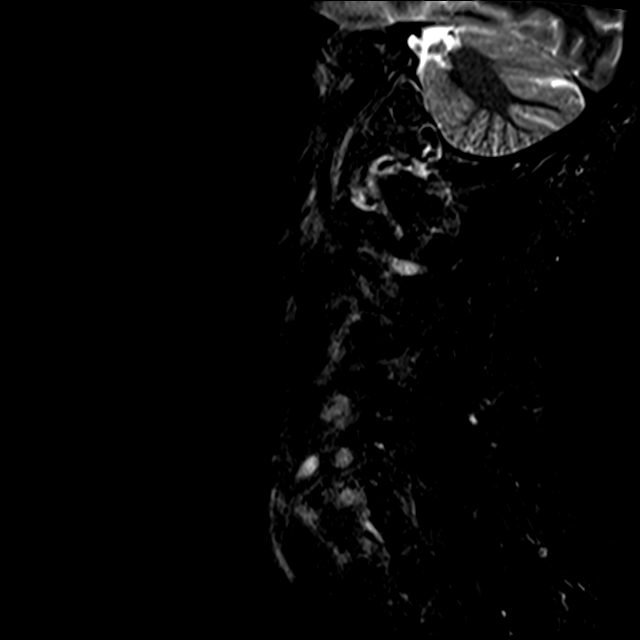
[im 3/14]
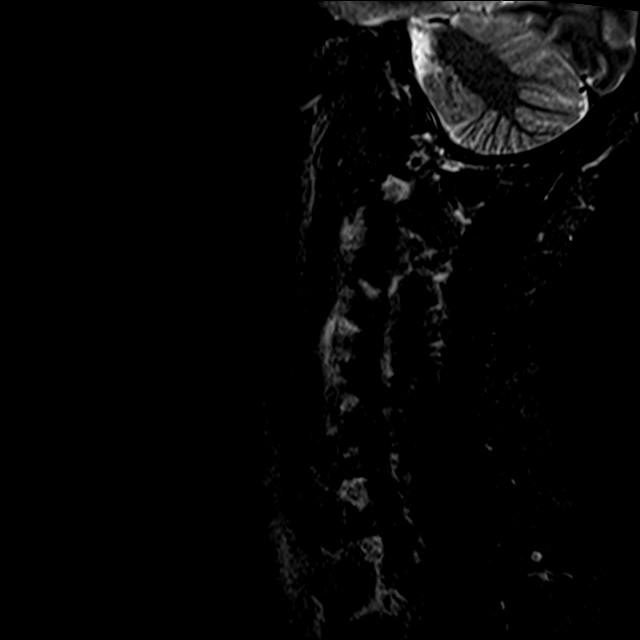
[im 6/14]
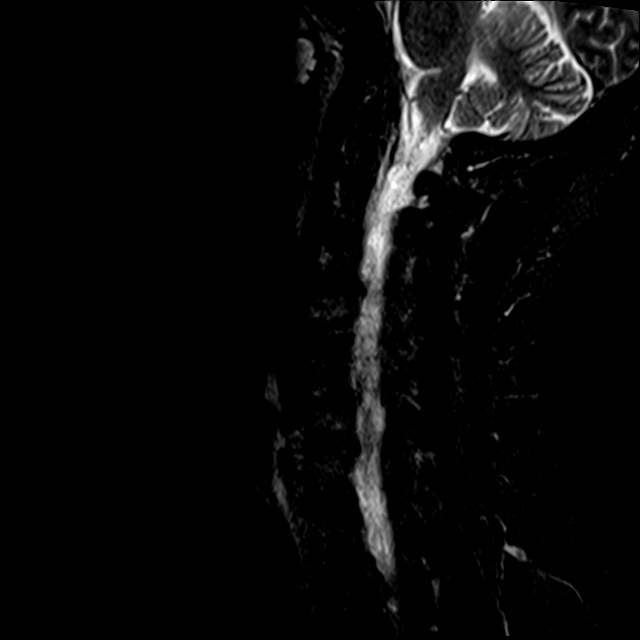
[im 8/14]
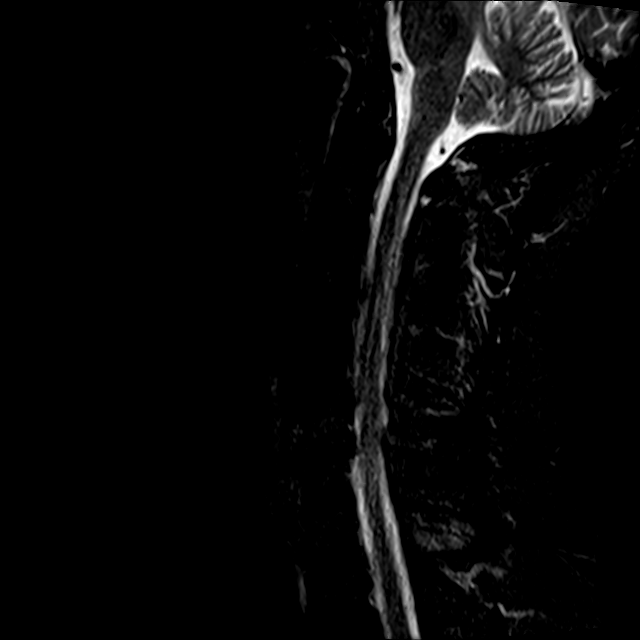
[im 11/14]
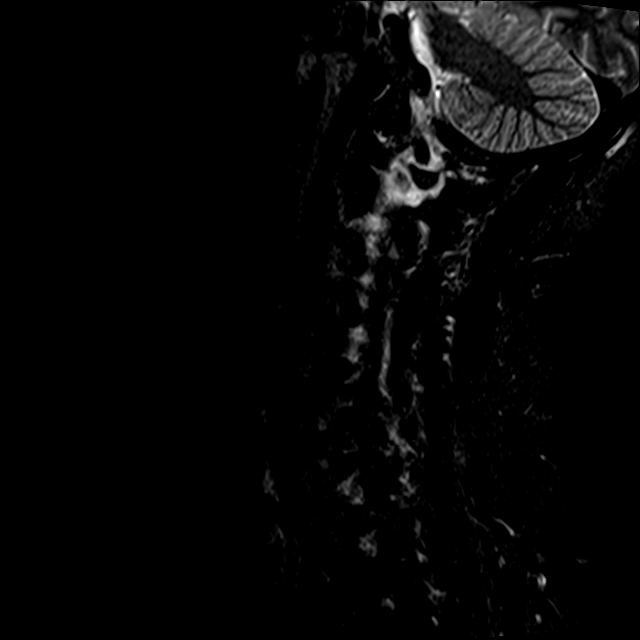
[im 14/14]
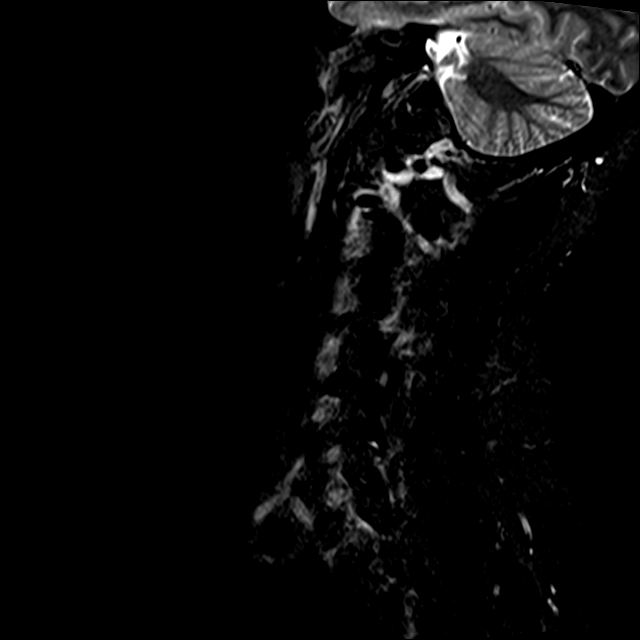

[Series 5: T2 · axial · 3.0mm · 0.56mm/px · z∈[-89,+22]mm · 9 of 31 slices shown (2 of 2)]
[im 1/31]
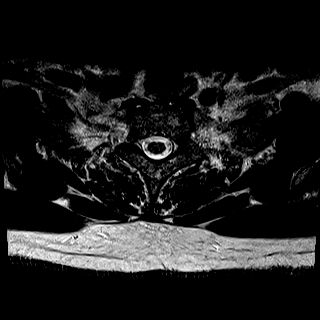
[im 6/31]
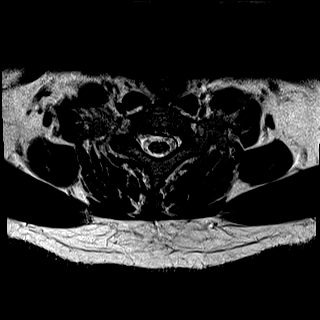
[im 9/31]
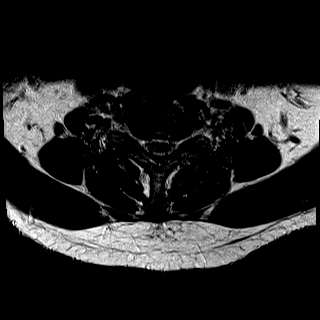
[im 14/31]
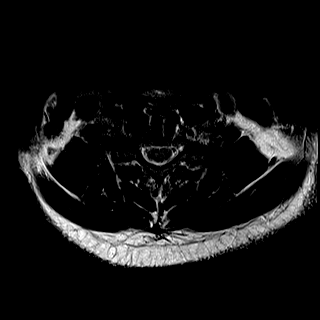
[im 17/31]
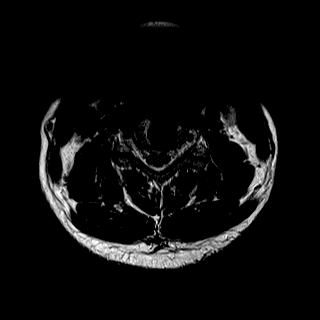
[im 22/31]
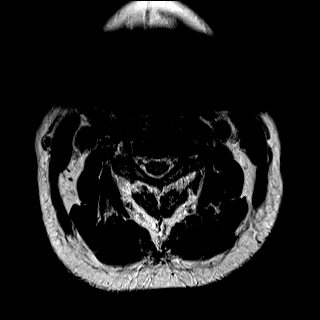
[im 25/31]
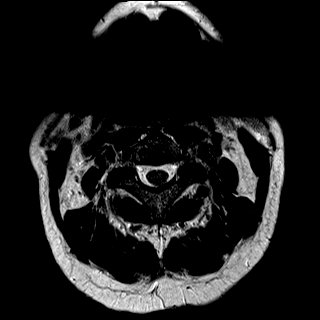
[im 28/31]
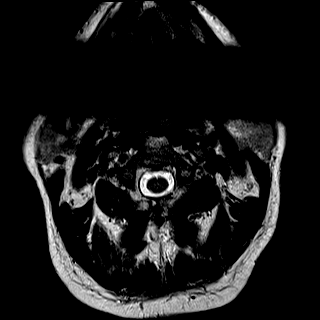
[im 31/31]
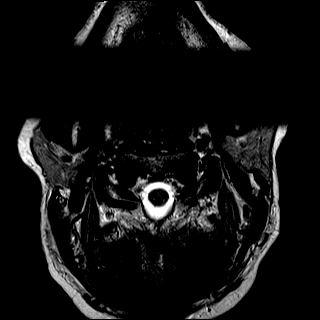

[Series 7: STIR · sagittal · 3.0mm · 0.43mm/px · 2 of 15 slices shown (2 of 2)]
[im 1/15]
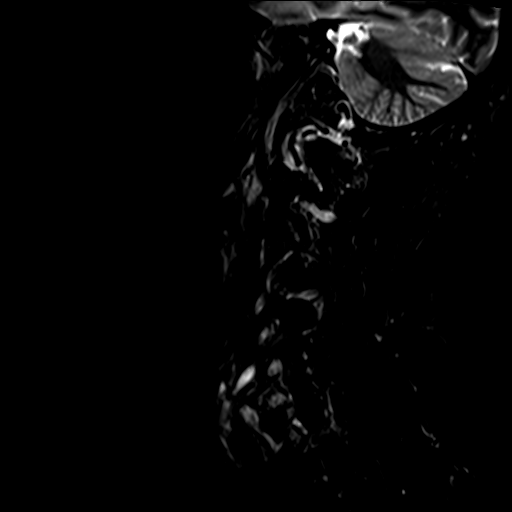
[im 3/15]
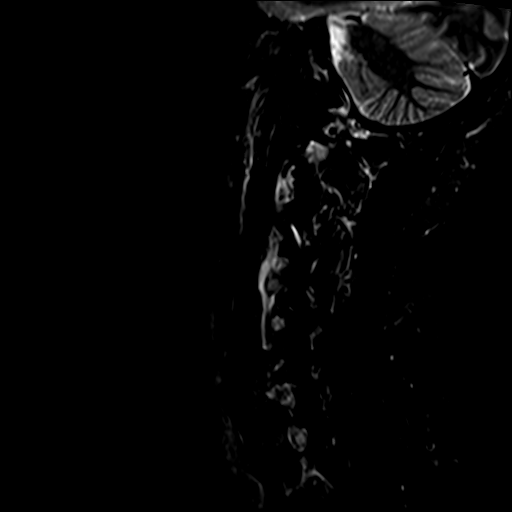

[28 of 48 positions shown; findings below may reference images not displayed]

FINDINGS: Alignment: Physiologic.

Vertebrae: No acute fracture, evidence of discitis, or bone lesion.

Cord: Normal signal and morphology.

Posterior Fossa, vertebral arteries, paraspinal tissues: Posterior
fossa demonstrates no focal abnormality. Vertebral artery flow voids
are maintained. Paraspinal soft tissues are unremarkable.

Disc levels:

Discs: Degenerative disease with disc height loss C5-6 and C6-7.
Anterior cervical fusion at C4-5.

C2-3: No significant disc bulge. No neural foraminal stenosis. No
central canal stenosis. Mild bilateral facet arthropathy.

C3-4: Mild broad-based disc bulge. Bilateral uncovertebral
degenerative changes. Moderate right and severe left foraminal
stenosis. Mild left facet arthropathy.

C4-5: Interbody fusion.  No foraminal or central canal stenosis.

C5-6: Mild broad-based disc bulge. Bilateral uncovertebral
degenerative changes. Mild left and moderate right foraminal
stenosis. No spinal stenosis.

C6-7: Broad-based disc osteophyte complex. Moderate bilateral facet
arthropathy. Severe bilateral foraminal stenosis. No central canal
stenosis.

C7-T1: No significant disc bulge. No neural foraminal stenosis. No
central canal stenosis.
IMPRESSION: 1. No acute osseous injury cervical spine.
2. Cervical spine spondylosis as described above.

## 2023-04-07 ENCOUNTER — Ambulatory Visit: Payer: Medicare HMO | Admitting: Orthopedic Surgery

## 2023-04-28 ENCOUNTER — Ambulatory Visit: Payer: Medicare HMO | Admitting: Orthopedic Surgery

## 2023-04-28 DIAGNOSIS — M542 Cervicalgia: Secondary | ICD-10-CM

## 2023-04-28 NOTE — Progress Notes (Signed)
Orthopedic Spine Surgery Office Note   Assessment: Patient is a 62 y.o. male with 2 issues:   1) chronic, stable low back pain with no radicular symptoms 2) chronic, stable neck pain.  Has radiation to the right side of his head posterior to the ear that then goes forward.  Also has left shoulder pain that seems to be coming from the shoulder itself     Plan: -Patient has tried  chiropractor, activity modification, injections, tylenol, NSAIDs, stem cell injections -He did get relief of his left shoulder pain with her prior injection, so feel that his pain is from the shoulder.  Pain is currently tolerable so no further intervention at this time -He reports significant improvement in his low back pain with stem cell injection, so no further intervention at this time -He is going to be moving soon so will have him return to the office on an as needed basis    __________________________________________________________________________   History: Patient is a 62 y.o. male who comes in today for routine follow-up on his cervical spine and lumbar spine.  He states that after last visit that he got a stem cell injection to his lumbar spine.  He reports that this injection has been helping.  He feels that his chronic back pain has gone from a 7 to a 4.  He is now able to do more than he used to be able to do.  He is not having any pain radiating into either lower extremity.  In regards to his neck, he feels that the pain is stable in the neck itself.  However, he has developed an unusual sensation behind his ear that radiates up to the temporal region of his head.  He says that it just feels unusual almost like spiders crawling on his skin.  He reports left shoulder pain and that our injection last time helped with some of that pain.  He has no other pain in the upper extremities.    He does feel that overall his pain has been better and he has been more active recently.  He said he has been working on  an RV in the last couple of weeks.   Previous treatments:  chiropractor, activity modification, injections, tylenol, NSAIDs, stem cell injections     Physical Exam:   General: no acute distress, appears stated age Neurologic: alert, answering questions appropriately, following commands Respiratory: unlabored breathing on room air, symmetric chest rise Psychiatric: appropriate affect, normal cadence to speech     MSK (spine):   -Strength exam                                                   Left                  Right Grip strength                5/5                  5/5 Interosseus                  5/5                  5/5 Wrist extension            5/5  5/5 Wrist flexion                 5/5                  5/5 Elbow flexion                5/5                  5/5 Deltoid                          5/5                  5/5   EHL                              5/5                  5/5 TA                                 5/5                  5/5 GSC                             5/5                  5/5 Knee extension            5/5                  5/5 Hip flexion                    5/5                  5/5   -Sensory exam                           Sensation intact to light touch in L3-S1 nerve distributions of bilateral lower extremities             Sensation intact to light touch in C5-T1 nerve distributions of bilateral upper extremities     Imaging: XR of the cervical spine from 10/21/2022 was previously independently reviewed and interpreted, showing ACDF with anterior instrumentation at C4/5. Interspinous motion between C4 and C5 less than 1 mm suggesting successful fusion.  No lucency around the anterior instrumentation.  Facet arthropathy at C5/6.  Disc height loss and anterior osteophyte formation at C5/6 and C6/7.  No evidence of instability on flexion/extension views.  No fracture or dislocation seen.   XR of the lumbar spine from 06/16/2022 was previously  independently reviewed and interpreted, showing no fracture or dislocation. Disc height loss at multiple levels with anterior osteophyte formation.     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 Lateral recess stenosis at L2/3. Lateral recess stenosis at L3/4.    MRI lumbar spine (on disc) with and without contrast from 06/2022 was previously reviewed and showed L1/2 disc herniation had resolved. He still had significant lateral recess stenosis at L2/3 and L3/4.      Patient name: Erik Cook Patient MRN: [id number] Date  of visit: 04/28/23

## 2023-08-30 LAB — COLOGUARD: COLOGUARD: NEGATIVE

## 2024-03-07 ENCOUNTER — Ambulatory Visit: Admitting: Orthopaedic Surgery

## 2024-03-07 ENCOUNTER — Other Ambulatory Visit (INDEPENDENT_AMBULATORY_CARE_PROVIDER_SITE_OTHER): Payer: Self-pay

## 2024-03-07 DIAGNOSIS — G8929 Other chronic pain: Secondary | ICD-10-CM | POA: Diagnosis not present

## 2024-03-07 DIAGNOSIS — M25512 Pain in left shoulder: Secondary | ICD-10-CM

## 2024-03-07 NOTE — Progress Notes (Signed)
 Office Visit Note   Patient: Erik Cook           Date of Birth: 01-02-1961           MRN: [id number] Visit Date: 03/07/2024              Requested by: Jarold Omar RAMAN, MD [id number] [place],  [place] [address] PCP: Jarold Omar RAMAN, MD   Assessment & Plan: Visit Diagnoses:  1. Chronic left shoulder pain     Plan: History of Present Illness Erik Cook is a 63 year old male who presents with worsening right shoulder pain and weakness.  He experiences significant pain and weakness in the right shoulder, particularly after a specific incident at the gym and while lifting a heavy bucket. During this incident, he felt and heard a tearing sensation in the shoulder, resulting in an inability to lift his arm. A cortisone injection was administered, suspecting bursitis, but symptoms persisted. He can move his arm in certain ways but cannot lift any weight. There is no bruising or visible deformity following the incidents. His past medical history includes left shoulder surgery  in 2013 for rotator cuff reattachment, bicep tenodesis. He has not engaged in heavy weightlifting recently, focusing on maintenance exercises.  Physical Exam MUSCULOSKELETAL: Left shoulder shows normal range of motion.  Pain and weakness with manual muscle testing of the supraspinatus, infraspinatus, subscapularis.  Fully healed surgical scars.  No impingement signs.  AC joint is nontender..  Results RADIOLOGY Shoulder X-ray: Degenerative changes in the shoulder joint, osteophytes in the humeral head and acromioclavicular (AC) joint, prior metallic anchor, degenerative irregularity at the rotator cuff repair site.  Assessment and Plan Left shoulder suspected acute on chronic rotator cuff tear Progressive shoulder dysfunction with weakness and pain in rotator cuff muscles. Previous repair with degenerative changes noted. Likely progression towards complete tear. - Order MRI of the right shoulder to assess the  extent and nature of the rotator cuff tear.  Follow-Up Instructions: No follow-ups on file.   Orders:  Orders Placed This Encounter  Procedures   XR Shoulder Left   No orders of the defined types were placed in this encounter.     Procedures: No procedures performed   Clinical Data: No additional findings.   Subjective: Chief Complaint  Patient presents with   Left Shoulder - Pain    HPI  Review of Systems  Constitutional: Negative.   HENT: Negative.    Eyes: Negative.   Respiratory: Negative.    Cardiovascular: Negative.   Gastrointestinal: Negative.   Endocrine: Negative.   Genitourinary: Negative.   Skin: Negative.   Allergic/Immunologic: Negative.   Neurological: Negative.   Hematological: Negative.   Psychiatric/Behavioral: Negative.    All other systems reviewed and are negative.    Objective: Vital Signs: There were no vitals taken for this visit.  Physical Exam Vitals and nursing note reviewed.  Constitutional:      Appearance: He is well-developed.  HENT:     Head: Normocephalic and atraumatic.  Eyes:     Pupils: Pupils are equal, round, and reactive to light.  Pulmonary:     Effort: Pulmonary effort is normal.  Abdominal:     Palpations: Abdomen is soft.  Musculoskeletal:        General: Normal range of motion.     Cervical back: Neck supple.  Skin:    General: Skin is warm.  Neurological:     Mental Status: He is alert and oriented  to person, place, and time.  Psychiatric:        Behavior: Behavior normal.        Thought Content: Thought content normal.        Judgment: Judgment normal.     Ortho Exam  Specialty Comments:  MRI LUMBAR SPINE WITHOUT CONTRAST   TECHNIQUE: Multiplanar, multisequence MR imaging of the lumbar spine was performed. No intravenous contrast was administered.   COMPARISON:  August 2021   FINDINGS: Segmentation:  Standard.   Alignment:  Stable.   Vertebrae: Stable vertebral body heights with  degenerative endplate irregularity. 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 No substantial marrow edema. No suspicious osseous lesion.   Conus medullaris and cauda equina: Conus extends to the L1 level. Conus and cauda equina appear normal.   Paraspinal and other soft tissues: Unremarkable.   Disc levels:   L1-L2: Disc bulge with superimposed left subarticular zone extrusion extending inferiorly to the lower L2 level. This indents the left anterolateral aspect of the thecal sac below disc level. Mild canal stenosis. Minor foraminal stenosis. Compression of left L2 nerve roots below disc level.   L2-L3: Disc bulge with endplate osteophytic ridging. Mild facet arthropathy. Moderate canal stenosis. Mild foraminal stenosis.   L3-L4: Disc bulge with endplate osteophytic ridging. Facet arthropathy with ligamentum flavum infolding. Mild to moderate canal stenosis. Mild to moderate right foraminal stenosis. Mild left foraminal stenosis.   L4-L5: Disc bulge with endplate osteophytic ridging. Facet arthropathy with ligamentum flavum infolding. Mild canal stenosis. Mild to moderate foraminal stenosis.   L5-S1: Disc bulge. Facet arthropathy. No canal or foraminal stenosis.   IMPRESSION: Multilevel degenerative changes as detailed above. Most notably, there is a disc extrusion at L1-L2 with compression of traversing left L2 nerve roots below disc level.     Electronically Signed   By: Santina Blanch M.D.   On: 03/31/2022 09:50  Imaging: No results found.   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   EYE SURGERY Bilateral    Lasik   KNEE ARTHROSCOPY     left   KNEE ARTHROSCOPY     on left side again   POSTERIOR CERVICAL FUSION/FORAMINOTOMY N/A 07/22/2021   Procedure: Right C5-6 and Right C6--7 Foraminotomies;  Surgeon: Lucilla Lynwood BRAVO, MD;  Location: [facility] OR;  Service: Orthopedics;  Laterality: N/A;   ROTATOR CUFF REPAIR     left   ROTATOR CUFF REPAIR     2nd time around (after fall)   SHOULDER  ARTHROSCOPY WITH SUBACROMIAL DECOMPRESSION AND BICEP TENDON REPAIR Right 03/13/2021   Procedure: SHOULDER ARTHROSCOPY WITH SUBACROMIAL DECOMPRESSION, MNIN OPEN ROTATOR CUFF TEAR REPAIR,  BICEPS TENODESIS;  Surgeon: Addie Cordella Hamilton, MD;  Location: [facility] OR;  Service: Orthopedics;  Laterality: Right;   Social History   Occupational History   Not on file  Tobacco Use   Smoking status: Never   Smokeless tobacco: Never  Vaping Use   Vaping status: Never Used  Substance and Sexual Activity   Alcohol use: No    Comment: occasional beer - twice month   Drug use: No   Sexual activity: Not on file

## 2024-03-08 ENCOUNTER — Encounter: Payer: Self-pay | Admitting: Orthopaedic Surgery

## 2024-03-15 ENCOUNTER — Ambulatory Visit
Admission: RE | Admit: 2024-03-15 | Discharge: 2024-03-15 | Disposition: A | Discharge status: Home / Self Care | Source: Ambulatory Visit | Attending: Orthopaedic Surgery | Admitting: Orthopaedic Surgery

## 2024-03-15 DIAGNOSIS — G8929 Other chronic pain: Secondary | ICD-10-CM

## 2024-03-17 ENCOUNTER — Ambulatory Visit (INDEPENDENT_AMBULATORY_CARE_PROVIDER_SITE_OTHER): Admitting: Orthopaedic Surgery

## 2024-03-17 DIAGNOSIS — M19012 Primary osteoarthritis, left shoulder: Secondary | ICD-10-CM

## 2024-03-17 DIAGNOSIS — M7542 Impingement syndrome of left shoulder: Secondary | ICD-10-CM

## 2024-03-17 NOTE — Progress Notes (Signed)
 Office Visit Note   Patient: Erik Cook           Date of Birth: [birth year]           MRN: [id number] Visit Date: 03/17/2024              Requested by: Jarold Omar RAMAN, MD [address] [place],  [place] [address] PCP: Jarold Omar RAMAN, MD   Assessment & Plan: Visit Diagnoses:  1. Impingement syndrome of left shoulder   2. Arthritis of left acromioclavicular joint     Plan: History of Present Illness Erik Cook is a 63 year old male who presents left shoulder MRI scan review.  He experiences significant pain and dysfunction in his left shoulder, particularly when reaching across his body, reaching back to grab a seatbelt, or pushing a car door. The pain is localized around the acromioclavicular joint, with no pain across the shoulder joint itself.  He has a prior rotator cuff repair, which is currently intact, but experiences pain when the arm is elevated, described as rubbing and tearing. An incident occurred where his arm gave out while lifting a bucket, with a sensation and sound of tearing, resulting in severe pain lasting about 45 minutes. He does not use pain medications.  He has a history of biceps tendon detachment and reattachment, as well as tears in the bicep, pectoral muscle, and rotator cuff. He describes a persistent tender area that feels like a bullet hole.  Physical Exam MUSCULOSKELETAL: Pain on impingement test of the shoulder.  Pain with cross body adduction test.  Tenderness of the AC joint.  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 Prior rotator cuff repair intact. Concerns about exacerbation of cervical spine issues post-surgery. - Consider surgical intervention to resect bone spurs if symptoms persist and interfere with daily activities. - Discussed potential surgical risks, including exacerbation of cervical spine issues and anesthesia-related cognitive effects. - Advised him to consider timing of surgery based on personal circumstances, including potential relocation to [place] . - Provided imaging report and CD for future consultations. - Advised follow-up with local orthopedist when ready to proceed with surgery.  Follow-Up Instructions: No follow-ups on file.   Orders:  No orders of the defined types were placed in this encounter.  No orders of the defined types were placed in this encounter.    Subjective: Chief Complaint  Patient presents with   Left Shoulder - Pain, Follow-up    HPI  Review of Systems  Constitutional: Negative.   HENT: Negative.    Eyes: Negative.   Respiratory: Negative.    Cardiovascular: Negative.   Gastrointestinal: Negative.   Endocrine: Negative.   Genitourinary: Negative.   Skin: Negative.   Allergic/Immunologic: Negative.   Neurological: Negative.   Hematological: Negative.   Psychiatric/Behavioral: Negative.    All other systems reviewed and are negative.    Objective: Vital Signs: There were no vitals taken for this visit.  Physical Exam Vitals and nursing note reviewed.  Constitutional:      Appearance: He is well-developed.  HENT:     Head: Normocephalic and atraumatic.  Eyes:     Pupils:  Pupils are equal, round, and reactive to light.  Pulmonary:     Effort: Pulmonary effort is normal.  Abdominal:     Palpations: Abdomen is soft.  Musculoskeletal:        General: Normal range of motion.     Cervical back: Neck supple.  Skin:    General: Skin is warm.  Neurological:     Mental Status: He is alert  and oriented to person, place, and time.  Psychiatric:        Behavior: Behavior normal.        Thought Content: Thought content normal.        Judgment: Judgment normal.     Ortho Exam  Specialty Comments:  MRI LUMBAR SPINE WITHOUT CONTRAST   TECHNIQUE: Multiplanar, multisequence MR imaging of the lumbar spine was performed. No intravenous contrast was administered.   COMPARISON:  August 2021   FINDINGS: Segmentation:  Standard.   Alignment:  Stable.   Vertebrae: Stable vertebral body heights with degenerative endplate irregularity. 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 No substantial marrow edema. No suspicious osseous lesion.   Conus medullaris and cauda equina: Conus extends to the L1 level. Conus and cauda equina appear normal.   Paraspinal and other soft tissues: Unremarkable.   Disc levels:   L1-L2: Disc bulge with superimposed left subarticular zone extrusion extending inferiorly to the lower L2 level. This indents the left anterolateral aspect of the thecal sac below disc level. Mild canal stenosis. Minor foraminal stenosis. Compression of left L2 nerve roots below disc level.   L2-L3: Disc bulge with endplate osteophytic ridging. Mild facet arthropathy. Moderate canal stenosis. Mild foraminal stenosis.   L3-L4: Disc bulge with endplate osteophytic ridging. Facet arthropathy with ligamentum flavum infolding. Mild to moderate canal stenosis. Mild to moderate right foraminal stenosis. Mild left foraminal stenosis.   L4-L5: Disc bulge with endplate osteophytic ridging. Facet arthropathy with ligamentum flavum infolding. Mild canal stenosis. Mild to moderate foraminal stenosis.   L5-S1: Disc bulge. Facet arthropathy. No canal or foraminal stenosis.   IMPRESSION: Multilevel degenerative changes as detailed above. Most notably, there is a disc extrusion at L1-L2 with compression of traversing left L2 nerve roots below disc level.      Electronically Signed   By: Santina Blanch M.D.   On: 03/31/2022 09:50  Imaging: No results found.   PMFS History: Patient Active Problem List   Diagnosis Date Noted   Other spondylosis with radiculopathy, cervical region 07/22/2021   Complete tear of right rotator cuff    Biceps tendonitis on right    Degenerative superior labral anterior-to-posterior (SLAP) tear of right shoulder    Lumbar radiculopathy 09/28/2016   Past Medical History:  Diagnosis Date   Arthritis    knees, lower back   Complication of anesthesia    Hard time waking up   GERD (gastroesophageal reflux disease)    no current problem - no meds   HLD (hyperlipidemia)    no meds - diet controlled   Hypertension    Sleep apnea    does not use cpap    No family history on file.  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   EYE SURGERY Bilateral    Lasik   KNEE ARTHROSCOPY  left   KNEE ARTHROSCOPY     on left side again   POSTERIOR CERVICAL FUSION/FORAMINOTOMY N/A 07/22/2021   Procedure: Right C5-6 and Right C6--7 Foraminotomies;  Surgeon: Lucilla Lynwood BRAVO, MD;  Location: [facility] OR;  Service: Orthopedics;  Laterality: N/A;   ROTATOR CUFF REPAIR     left   ROTATOR CUFF REPAIR     2nd time around (after fall)   SHOULDER ARTHROSCOPY WITH SUBACROMIAL DECOMPRESSION AND BICEP TENDON REPAIR Right 03/13/2021   Procedure: SHOULDER ARTHROSCOPY WITH SUBACROMIAL DECOMPRESSION, MNIN OPEN ROTATOR CUFF TEAR REPAIR,  BICEPS TENODESIS;  Surgeon: Addie Cordella Hamilton, MD;  Location: [facility] OR;  Service: Orthopedics;  Laterality: Right;   Social History   Occupational History   Not on file  Tobacco Use   Smoking status: Never   Smokeless tobacco: Never  Vaping  Use   Vaping status: Never Used  Substance and Sexual Activity   Alcohol use: No    Comment: occasional beer - twice month   Drug use: No   Sexual activity: Not on file

## 2024-05-16 ENCOUNTER — Telehealth: Payer: Self-pay | Admitting: Internal Medicine

## 2024-05-16 NOTE — Telephone Encounter (Signed)
 Pt would like to move forward with surgery and asking for a call back. Pt phone number is [phone]

## 2024-05-16 NOTE — Telephone Encounter (Signed)
 Patient wants to move forward with surgery. Can you fill out surgery sheet for Erik Cook?

## 2024-05-18 NOTE — Telephone Encounter (Signed)
 FYI

## 2024-05-24 ENCOUNTER — Other Ambulatory Visit: Payer: Self-pay

## 2024-05-24 ENCOUNTER — Encounter (HOSPITAL_BASED_OUTPATIENT_CLINIC_OR_DEPARTMENT_OTHER): Payer: Self-pay | Admitting: Orthopaedic Surgery

## 2024-05-25 ENCOUNTER — Encounter (HOSPITAL_BASED_OUTPATIENT_CLINIC_OR_DEPARTMENT_OTHER)
Admission: RE | Admit: 2024-05-25 | Discharge: 2024-05-25 | Disposition: A | Discharge status: Home / Self Care | Source: Ambulatory Visit | Attending: Orthopaedic Surgery | Admitting: Orthopaedic Surgery

## 2024-05-25 ENCOUNTER — Ambulatory Visit: Admitting: Orthopaedic Surgery

## 2024-05-25 DIAGNOSIS — M7542 Impingement syndrome of left shoulder: Secondary | ICD-10-CM | POA: Diagnosis not present

## 2024-05-25 DIAGNOSIS — M19012 Primary osteoarthritis, left shoulder: Secondary | ICD-10-CM | POA: Diagnosis not present

## 2024-05-25 DIAGNOSIS — Z01818 Encounter for other preprocedural examination: Secondary | ICD-10-CM | POA: Diagnosis present

## 2024-05-25 LAB — BASIC METABOLIC PANEL WITH GFR
Anion gap: 9 (ref 5–15)
BUN: 12 mg/dL (ref 8–23)
CO2: 28 mmol/L (ref 22–32)
Calcium: 8.8 mg/dL — ABNORMAL LOW (ref 8.9–10.3)
Chloride: 98 mmol/L (ref 98–111)
Creatinine, Ser: 1.18 mg/dL (ref 0.61–1.24)
GFR, Estimated: >60 mL/min (ref >60)
Glucose, Bld: 95 mg/dL (ref 70–99)
Potassium: 4.9 mmol/L (ref 3.5–5.1)
Sodium: 135 mmol/L (ref 135–145)

## 2024-05-25 NOTE — Progress Notes (Signed)
 Office Visit Note   Patient: Erik Cook           Date of Birth: 1961-07-24           MRN: [id number] Visit Date: 05/25/2024              Requested by: Jarold Omar RAMAN, MD [address] [place],  [place] [address] PCP: Jarold Omar RAMAN, MD   Assessment & Plan: Visit Diagnoses:  1. Impingement syndrome of left shoulder   2. Arthritis of left acromioclavicular joint     Plan: History of Present Illness Erik Cook is a 63 year old male who presents for preoperative consultation regarding his upcoming shoulder surgery.  He has questions regarding the upcoming surgery.  He is experiencing popping in the shoulder.  Examination of the left shoulder is unchanged from prior visit.  Assessment and Plan Left shoulder pain with degenerative labral tears, AC arthritis, impingement, partial RCTs Chronic pain with movement limitations and sleep disruption. Previous biceps tenodesis. Potential future shoulder replacement due to arthritis. - We will plan to proceed with surgery next Wednesday with extensive debridement, subacromial decompression, distal clavicle excision  Follow-Up Instructions: No follow-ups on file.   Orders:  No orders of the defined types were placed in this encounter.  No orders of the defined types were placed in this encounter.     Procedures: No procedures performed   Clinical Data: No additional findings.   Subjective: Chief Complaint  Patient presents with   Left Shoulder - Follow-up, Pain    HPI  Review of Systems  Constitutional: Negative.   HENT: Negative.    Eyes: Negative.   Respiratory: Negative.    Cardiovascular: Negative.   Gastrointestinal: Negative.   Endocrine: Negative.   Genitourinary: Negative.   Skin: Negative.   Allergic/Immunologic: Negative.   Neurological: Negative.   Hematological: Negative.   Psychiatric/Behavioral: Negative.    All other systems reviewed and are negative.    Objective: Vital Signs: There  were no vitals taken for this visit.  Physical Exam Vitals and nursing note reviewed.  Constitutional:      Appearance: He is well-developed.  HENT:     Head: Normocephalic and atraumatic.  Eyes:     Pupils: Pupils are equal, round, and reactive to light.  Pulmonary:     Effort: Pulmonary effort is normal.  Abdominal:     Palpations: Abdomen is soft.  Musculoskeletal:        General: Normal range of motion.     Cervical back: Neck supple.  Skin:    General: Skin is warm.  Neurological:     Mental Status: He is alert and oriented to person, place, and time.  Psychiatric:        Behavior: Behavior normal.        Thought Content: Thought content normal.        Judgment: Judgment normal.     Ortho Exam  Specialty Comments:  MRI LUMBAR SPINE WITHOUT CONTRAST   TECHNIQUE: Multiplanar, multisequence MR imaging of the lumbar spine was performed. No intravenous contrast was administered.   COMPARISON:  August 2021   FINDINGS: Segmentation:  Standard.   Alignment:  Stable.   Vertebrae: Stable vertebral body heights with degenerative endplate irregularity. 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 No substantial marrow edema. No suspicious osseous lesion.   Conus medullaris and cauda equina: Conus extends to the L1 level. Conus and cauda equina appear normal.   Paraspinal and other soft  tissues: Unremarkable.   Disc levels:   L1-L2: Disc bulge with superimposed left subarticular zone extrusion extending inferiorly to the lower L2 level. This indents the left anterolateral aspect of the thecal sac below disc level. Mild canal stenosis. Minor foraminal stenosis. Compression of left L2 nerve roots below disc level.   L2-L3: Disc bulge with endplate osteophytic ridging. Mild facet arthropathy. Moderate canal stenosis. Mild foraminal stenosis.   L3-L4: Disc bulge with endplate osteophytic ridging. Facet arthropathy with ligamentum flavum infolding. Mild to  moderate canal stenosis. Mild to moderate right foraminal stenosis. Mild left foraminal stenosis.   L4-L5: Disc bulge with endplate osteophytic ridging. Facet arthropathy with ligamentum flavum infolding. Mild canal stenosis. Mild to moderate foraminal stenosis.   L5-S1: Disc bulge. Facet arthropathy. No canal or foraminal stenosis.   IMPRESSION: Multilevel degenerative changes as detailed above. Most notably, there is a disc extrusion at L1-L2 with compression of traversing left L2 nerve roots below disc level.     Electronically Signed   By: Santina Blanch M.D.   On: 03/31/2022 09:50  Imaging: No results found.   PMFS History: Patient Active Problem List   Diagnosis Date Noted   Other spondylosis with radiculopathy, cervical region 07/22/2021   Complete tear of right rotator cuff    Biceps tendonitis on right    Degenerative superior labral anterior-to-posterior (SLAP) tear of right shoulder    Lumbar radiculopathy 09/28/2016   Past Medical History:  Diagnosis Date   Arthritis    knees, lower back   Complication of anesthesia    Hard time waking up   GERD (gastroesophageal reflux disease)    no current problem - no meds   HLD (hyperlipidemia)    no meds - diet controlled   Hypertension    Sleep apnea    does not use cpap    No family history on file.  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   EYE SURGERY Bilateral    Lasik   KNEE ARTHROSCOPY     left   KNEE ARTHROSCOPY     on left side again   POSTERIOR CERVICAL FUSION/FORAMINOTOMY N/A 07/22/2021   Procedure: Right C5-6 and Right C6--7 Foraminotomies;  Surgeon: Lucilla Lynwood BRAVO, MD;  Location: [facility];  Service: Orthopedics;  Laterality: N/A;    ROTATOR CUFF REPAIR     left   ROTATOR CUFF REPAIR     2nd time around (after fall)   SHOULDER ARTHROSCOPY WITH SUBACROMIAL DECOMPRESSION AND BICEP TENDON REPAIR Right 03/13/2021   Procedure: SHOULDER ARTHROSCOPY WITH SUBACROMIAL DECOMPRESSION, MNIN OPEN ROTATOR CUFF TEAR REPAIR,  BICEPS TENODESIS;  Surgeon: Addie Cordella Hamilton, MD;  Location: [facility] OR;  Service: Orthopedics;  Laterality: Right;   Social History   Occupational History   Not on file  Tobacco Use   Smoking status: Never   Smokeless tobacco: Never  Vaping Use   Vaping status: Never Used  Substance and Sexual Activity   Alcohol use: No    Comment: occasional beer - twice month   Drug use: No   Sexual activity: Not on file

## 2024-05-26 ENCOUNTER — Other Ambulatory Visit: Payer: Self-pay | Admitting: Physician Assistant

## 2024-05-26 MED ORDER — ONDANSETRON HCL 4 MG PO TABS: 4.0000 mg | ORAL_TABLET | Freq: Three times a day (TID) | ORAL | 0 refills | Status: AC | PRN

## 2024-05-26 MED ORDER — HYDROCODONE-ACETAMINOPHEN 5-325 MG PO TABS: 1.0000 | ORAL_TABLET | Freq: Three times a day (TID) | ORAL | 0 refills | Status: AC | PRN

## 2024-05-31 ENCOUNTER — Ambulatory Visit (HOSPITAL_BASED_OUTPATIENT_CLINIC_OR_DEPARTMENT_OTHER): Admitting: Anesthesiology

## 2024-05-31 ENCOUNTER — Ambulatory Visit (HOSPITAL_BASED_OUTPATIENT_CLINIC_OR_DEPARTMENT_OTHER)
Admission: RE | Admit: 2024-05-31 | Discharge: 2024-05-31 | Disposition: A | Discharge status: Home / Self Care | Attending: Orthopaedic Surgery | Admitting: Orthopaedic Surgery

## 2024-05-31 ENCOUNTER — Encounter: Payer: Self-pay | Admitting: Orthopaedic Surgery

## 2024-05-31 ENCOUNTER — Encounter (HOSPITAL_BASED_OUTPATIENT_CLINIC_OR_DEPARTMENT_OTHER)
Admission: RE | Disposition: A | Discharge status: Home / Self Care | Payer: Self-pay | Source: Home / Self Care | Attending: Orthopaedic Surgery

## 2024-05-31 ENCOUNTER — Other Ambulatory Visit: Payer: Self-pay

## 2024-05-31 ENCOUNTER — Encounter (HOSPITAL_BASED_OUTPATIENT_CLINIC_OR_DEPARTMENT_OTHER): Payer: Self-pay | Admitting: Orthopaedic Surgery

## 2024-05-31 DIAGNOSIS — M47812 Spondylosis without myelopathy or radiculopathy, cervical region: Secondary | ICD-10-CM | POA: Insufficient documentation

## 2024-05-31 DIAGNOSIS — M19012 Primary osteoarthritis, left shoulder: Secondary | ICD-10-CM | POA: Insufficient documentation

## 2024-05-31 DIAGNOSIS — M75102 Unspecified rotator cuff tear or rupture of left shoulder, not specified as traumatic: Secondary | ICD-10-CM | POA: Insufficient documentation

## 2024-05-31 DIAGNOSIS — M75112 Incomplete rotator cuff tear or rupture of left shoulder, not specified as traumatic: Secondary | ICD-10-CM

## 2024-05-31 DIAGNOSIS — K219 Gastro-esophageal reflux disease without esophagitis: Secondary | ICD-10-CM | POA: Insufficient documentation

## 2024-05-31 DIAGNOSIS — G4730 Sleep apnea, unspecified: Secondary | ICD-10-CM | POA: Insufficient documentation

## 2024-05-31 DIAGNOSIS — M25812 Other specified joint disorders, left shoulder: Secondary | ICD-10-CM | POA: Diagnosis present

## 2024-05-31 DIAGNOSIS — I10 Essential (primary) hypertension: Secondary | ICD-10-CM | POA: Insufficient documentation

## 2024-05-31 DIAGNOSIS — E669 Obesity, unspecified: Secondary | ICD-10-CM | POA: Insufficient documentation

## 2024-05-31 DIAGNOSIS — Z6831 Body mass index (BMI) 31.0-31.9, adult: Secondary | ICD-10-CM | POA: Diagnosis not present

## 2024-05-31 DIAGNOSIS — M7542 Impingement syndrome of left shoulder: Secondary | ICD-10-CM | POA: Insufficient documentation

## 2024-05-31 DIAGNOSIS — Z79899 Other long term (current) drug therapy: Secondary | ICD-10-CM | POA: Insufficient documentation

## 2024-05-31 DIAGNOSIS — Z791 Long term (current) use of non-steroidal anti-inflammatories (NSAID): Secondary | ICD-10-CM | POA: Diagnosis not present

## 2024-05-31 DIAGNOSIS — M94212 Chondromalacia, left shoulder: Secondary | ICD-10-CM | POA: Insufficient documentation

## 2024-05-31 HISTORY — PX: POSTERIOR LUMBAR FUSION 2 WITH HARDWARE REMOVAL: SHX7297

## 2024-05-31 SURGERY — ARTHROSCOPY, SHOULDER WITH DEBRIDEMENT
Anesthesia: General | Site: Shoulder | Laterality: Left

## 2024-05-31 MED ORDER — LIDOCAINE 2% (20 MG/ML) 5 ML SYRINGE
INTRAMUSCULAR | Status: DC | PRN
  Administered 2024-05-31: 100 mg via INTRAVENOUS

## 2024-05-31 MED ORDER — CEFAZOLIN SODIUM-DEXTROSE 2-4 GM/100ML-% IV SOLN
2.0000 g | INTRAVENOUS | Status: AC
  Administered 2024-05-31: 2 g via INTRAVENOUS

## 2024-05-31 MED ORDER — BUPIVACAINE HCL (PF) 0.25 % IJ SOLN
INTRAMUSCULAR | Status: AC
Start: 2024-05-31 — End: 2024-05-31
  Filled 2024-05-31: qty 30

## 2024-05-31 MED ORDER — BUPIVACAINE-EPINEPHRINE (PF) 0.5% -1:200000 IJ SOLN
INTRAMUSCULAR | Status: AC
Start: 2024-05-31 — End: 2024-05-31
  Filled 2024-05-31: qty 30

## 2024-05-31 MED ORDER — ACETAMINOPHEN 500 MG PO TABS
1000.0000 mg | ORAL_TABLET | Freq: Once | ORAL | Status: AC
  Administered 2024-05-31: 1000 mg via ORAL

## 2024-05-31 MED ORDER — FENTANYL CITRATE (PF) 100 MCG/2ML IJ SOLN: 25.0000 ug | INTRAMUSCULAR | Status: DC | PRN

## 2024-05-31 MED ORDER — SUGAMMADEX SODIUM 200 MG/2ML IV SOLN
INTRAVENOUS | Status: DC | PRN
  Administered 2024-05-31: 200 mg via INTRAVENOUS

## 2024-05-31 MED ORDER — ONDANSETRON HCL 4 MG/2ML IJ SOLN
INTRAMUSCULAR | Status: DC | PRN
Start: 2024-05-31 — End: 2024-05-31
  Administered 2024-05-31: 4 mg via INTRAVENOUS

## 2024-05-31 MED ORDER — SODIUM CHLORIDE 0.9 % IR SOLN
Status: DC | PRN
  Administered 2024-05-31: 7500 mL

## 2024-05-31 MED ORDER — PHENYLEPHRINE HCL-NACL 20-0.9 MG/250ML-% IV SOLN
INTRAVENOUS | Status: DC | PRN
  Administered 2024-05-31: 40 ug/min via INTRAVENOUS

## 2024-05-31 MED ORDER — KETOROLAC TROMETHAMINE 30 MG/ML IJ SOLN
INTRAMUSCULAR | Status: DC | PRN
Start: 2024-05-31 — End: 2024-05-31
  Administered 2024-05-31: 30 mg via INTRAVENOUS

## 2024-05-31 MED ORDER — PHENYLEPHRINE HCL (PRESSORS) 10 MG/ML IV SOLN
INTRAVENOUS | Status: DC | PRN
  Administered 2024-05-31: 160 ug via INTRAVENOUS
  Administered 2024-05-31 (×2): 240 ug via INTRAVENOUS

## 2024-05-31 MED ORDER — LIDOCAINE HCL (PF) 1 % IJ SOLN
INTRAMUSCULAR | Status: AC
Start: 2024-05-31 — End: 2024-05-31
  Filled 2024-05-31: qty 30

## 2024-05-31 MED ORDER — BUPIVACAINE-EPINEPHRINE (PF) 0.25% -1:200000 IJ SOLN
INTRAMUSCULAR | Status: AC
Start: 2024-05-31 — End: 2024-05-31
  Filled 2024-05-31: qty 30

## 2024-05-31 MED ORDER — FENTANYL CITRATE (PF) 100 MCG/2ML IJ SOLN
INTRAMUSCULAR | Status: DC | PRN
  Administered 2024-05-31 (×2): 50 ug via INTRAVENOUS

## 2024-05-31 MED ORDER — GABAPENTIN 300 MG PO CAPS
300.0000 mg | ORAL_CAPSULE | Freq: Once | ORAL | Status: AC
  Administered 2024-05-31: 300 mg via ORAL

## 2024-05-31 MED ORDER — FENTANYL CITRATE (PF) 100 MCG/2ML IJ SOLN
INTRAMUSCULAR | Status: AC
  Filled 2024-05-31: qty 2

## 2024-05-31 MED ORDER — GABAPENTIN 300 MG PO CAPS
ORAL_CAPSULE | ORAL | Status: AC
Start: 2024-05-31 — End: 2024-05-31
  Filled 2024-05-31: qty 1

## 2024-05-31 MED ORDER — EPHEDRINE SULFATE (PRESSORS) 25 MG/5ML IV SOSY
PREFILLED_SYRINGE | INTRAVENOUS | Status: DC | PRN
  Administered 2024-05-31 (×2): 5 mg via INTRAVENOUS

## 2024-05-31 MED ORDER — DEXAMETHASONE SOD PHOSPHATE PF 10 MG/ML IJ SOLN
INTRAMUSCULAR | Status: DC | PRN
  Administered 2024-05-31: 10 mg via INTRAVENOUS

## 2024-05-31 MED ORDER — LACTATED RINGERS IV SOLN: INTRAVENOUS | Status: DC

## 2024-05-31 MED ORDER — DROPERIDOL 2.5 MG/ML IJ SOLN: 0.6250 mg | Freq: Once | INTRAMUSCULAR | Status: DC | PRN

## 2024-05-31 MED ORDER — ONDANSETRON HCL 4 MG/2ML IJ SOLN
INTRAMUSCULAR | Status: AC
Start: 2024-05-31 — End: 2024-05-31
  Filled 2024-05-31: qty 2

## 2024-05-31 MED ORDER — EPHEDRINE 5 MG/ML INJ
INTRAVENOUS | Status: AC
  Filled 2024-05-31: qty 15

## 2024-05-31 MED ORDER — LIDOCAINE-EPINEPHRINE (PF) 1 %-1:200000 IJ SOLN
INTRAMUSCULAR | Status: AC
  Filled 2024-05-31: qty 30

## 2024-05-31 MED ORDER — PROPOFOL 10 MG/ML IV BOLUS
INTRAVENOUS | Status: DC | PRN
  Administered 2024-05-31: 200 mg via INTRAVENOUS

## 2024-05-31 MED ORDER — ROCURONIUM BROMIDE 100 MG/10ML IV SOLN
INTRAVENOUS | Status: DC | PRN
  Administered 2024-05-31: 50 mg via INTRAVENOUS

## 2024-05-31 MED ORDER — MIDAZOLAM HCL 2 MG/2ML IJ SOLN
INTRAMUSCULAR | Status: AC
  Filled 2024-05-31: qty 2

## 2024-05-31 MED ORDER — ROCURONIUM BROMIDE 10 MG/ML (PF) SYRINGE
PREFILLED_SYRINGE | INTRAVENOUS | Status: AC
  Filled 2024-05-31: qty 10

## 2024-05-31 MED ORDER — MIDAZOLAM HCL 5 MG/5ML IJ SOLN
INTRAMUSCULAR | Status: DC | PRN
  Administered 2024-05-31: 2 mg via INTRAVENOUS

## 2024-05-31 MED ORDER — LIDOCAINE-EPINEPHRINE (PF) 1 %-1:200000 IJ SOLN
INTRAMUSCULAR | Status: DC | PRN
  Administered 2024-05-31: 40 mL

## 2024-05-31 MED ORDER — ACETAMINOPHEN 500 MG PO TABS
ORAL_TABLET | ORAL | Status: AC
Start: 2024-05-31 — End: 2024-05-31
  Filled 2024-05-31: qty 2

## 2024-05-31 SURGICAL SUPPLY — 44 items
BLADE EXCALIBUR 4.0X13 (MISCELLANEOUS) IMPLANT
BURR OVAL 8 FLU 4.0X13 (MISCELLANEOUS) ×1 IMPLANT
CANNULA 5.75X71 LONG (CANNULA) ×1 IMPLANT
CANNULA SHOULDER 7CM (CANNULA) IMPLANT
CANNULA TWIST IN 8.25X7CM (CANNULA) IMPLANT
COOLER ICEMAN CLASSIC (MISCELLANEOUS) ×1 IMPLANT
DISSECTOR 3.8MM X 13CM (MISCELLANEOUS) ×1 IMPLANT
DRAPE IMP U-DRAPE 54X76 (DRAPES) ×1 IMPLANT
DRAPE INCISE IOBAN 66X45 STRL (DRAPES) ×1 IMPLANT
DRAPE STERI 35X30 U-POUCH (DRAPES) ×1 IMPLANT
DRAPE TOP ARMCOVERS (MISCELLANEOUS) IMPLANT
DRAPE U-SHAPE 47X51 STRL (DRAPES) ×2 IMPLANT
DRAPE U-SHAPE 76X120 STRL (DRAPES) ×2 IMPLANT
DURAPREP 26ML APPLICATOR (WOUND CARE) ×2 IMPLANT
GAUZE PAD ABD 8X10 STRL (GAUZE/BANDAGES/DRESSINGS) ×2 IMPLANT
GAUZE SPONGE 4X4 12PLY STRL (GAUZE/BANDAGES/DRESSINGS) ×2 IMPLANT
GAUZE XEROFORM 1X8 LF (GAUZE/BANDAGES/DRESSINGS) ×1 IMPLANT
GLOVE BIOGEL PI IND STRL 7.0 (GLOVE) IMPLANT
GLOVE BIOGEL PI IND STRL 7.5 (GLOVE) ×1 IMPLANT
GLOVE ECLIPSE 7.0 STRL STRAW (GLOVE) ×2 IMPLANT
GLOVE INDICATOR 7.0 STRL GRN (GLOVE) ×1 IMPLANT
GLOVE SURG SS PI 7.0 STRL IVOR (GLOVE) IMPLANT
GLOVE SURG SYN 7.5 PF PI (GLOVE) ×2 IMPLANT
GOWN STRL REUS W/ TWL LRG LVL3 (GOWN DISPOSABLE) ×1 IMPLANT
GOWN STRL SURGICAL XL XLNG (GOWN DISPOSABLE) ×2 IMPLANT
MANIFOLD NEPTUNE II (INSTRUMENTS) ×1 IMPLANT
NDL HD SCORPION MEGA LOADER (NEEDLE) IMPLANT
PACK ARTHROSCOPY DSU (CUSTOM PROCEDURE TRAY) ×1 IMPLANT
PACK BASIN DAY SURGERY FS (CUSTOM PROCEDURE TRAY) ×1 IMPLANT
PAD COLD SHLDR WRAP-ON (PAD) ×1 IMPLANT
SHEET MEDIUM DRAPE 40X70 STRL (DRAPES) ×1 IMPLANT
SLEEVE SCD COMPRESS KNEE MED (STOCKING) ×1 IMPLANT
SLING ARM FOAM STRAP LRG (SOFTGOODS) IMPLANT
SOL .9 NS 3000ML IRR UROMATIC (IV SOLUTION) IMPLANT
SUT ETHILON 3 0 PS 1 (SUTURE) ×1 IMPLANT
SUT TIGER TAPE 7 IN WHITE (SUTURE) IMPLANT
SUTURE FIBERWR #2 38 T-5 BLUE (SUTURE) IMPLANT
SUTURE TAPE 1.3 40 TPR END (SUTURE) IMPLANT
SUTURE TAPE TIGERLINK 1.3MM BL (SUTURE) IMPLANT
TAPE FIBER 2MM 7IN #2 BLUE (SUTURE) IMPLANT
TOWEL GREEN STERILE FF (TOWEL DISPOSABLE) ×1 IMPLANT
TUBE CONNECTING 20X1/4 (TUBING) ×1 IMPLANT
TUBING ARTHROSCOPY IRRIG 16FT (MISCELLANEOUS) ×1 IMPLANT
WAND ABLATOR APOLLO I90 (BUR) ×1 IMPLANT

## 2024-05-31 NOTE — Op Note (Signed)
   Date of Surgery: 05/31/2024  INDICATIONS: The patient is a 63 year old male with left shoulder pain that has failed conservative treatment;  The patient did consent to the procedure after discussion of the risks and benefits.  DIAGNOSES: Left shoulder, chronic rotator cuff tear, AC arthritis, subacromial impingement, and glenohumeral chondromalacia.  POST-OPERATIVE DIAGNOSIS: same  PROCEDURE: Arthroscopic extensive debridement - 29823 Subdeltoid Bursa, Supraspinatus Tendon, Anterior Labrum, Superior Labrum, Posterior Labrum, and glenohumeral chondral surfaces Arthroscopic distal clavicle excision - 70175 Arthroscopic subacromial decompression - 70173   OPERATIVE FINDING: Exam under anesthesia: Mild decrease in forward flexion Articular space: Slightly decreased Chondral surfaces: Grade IV chondromalacia Biceps: Previous biceps tenodesis Subscapularis: Moderate tendinosis Supraspinatus: Severe tendinosis with irreparable degenerative tear Infraspinatus: Moderate tendinosis  SURGEON: N. Ozell Cummins, M.D.  ASSIST: Ronal Morna Grave, PA-C  ANESTHESIA:  general, regional  IV FLUIDS AND URINE: See anesthesia.  ESTIMATED BLOOD LOSS: minimal mL.  IMPLANTS: None  COMPLICATIONS: None.  DESCRIPTION OF PROCEDURE: The patient was brought to the operating room and placed supine on the operating table.  The patient had been signed prior to the procedure and this was documented. The patient had the anesthesia placed by the anesthesiologist.  A time-out was performed to confirm that this was the correct patient, site, side and location. The patient did receive antibiotics prior to the incision and was re-dosed during the procedure as needed at indicated intervals.  The patient was then positioned into the beach chair position with all bony prominences well padded and relaxed neck. The patient had the operative extremity prepped and draped in the standard surgical fashion.  Bony landmarks  were palpated and marked on the skin.  A posterior shoulder arthroscopy portal was created followed by an anterior portal through the rotator interval using spinal needle localization.  Diagnostic arthroscopy ensued with the following findings. Grade IV chondromalacia with numerous areas of unstable chondral flaps on the humeral head, severely degenerative really torn labrum circumferentially, prior biceps tenodesis, severe supraspinatus tendinosis, moderate tendinosis of upper subscapularis, large inferior acromial osteophyte and unfavorable acromial anatomy, severe acromioclavicular osteoarthritis with bulky distal clavicle changes, full-thickness irreparable degenerative supraspinatus tear with severe tendinosis.  The above-mentioned structures were all debrided back to stable margins with an oscillating shaver.  The supraspinatus was irreparable given the severe tendinosis.  The tissue was extremely of poor quality and friable.  The acromiohumeral distance was significantly decreased predisposing to impingement.  Subacromial decompression and acromioplasty with CA ligament release was performed.  Distal clavicle excision was performed removing approximately 5 mm with a bur. Excess fluid was expressed from the shoulder.  Incisions closed with nylon.  Sterile dressings were applied.  Shoulder sling was placed.  Patient tolerated the procedure well had no any complications. Morna Grave, my PA, was a medical necessity for the entirety of the surgery including opening, closing, limb positioning, retracting, exposing, and repairing.  POSTOPERATIVE PLAN: Patient will be discharged home.  Follow-up in 1 week for suture removal and initiation of physical therapy.  GEANNIE Ozell Cummins, MD [facility] 2:47 PM

## 2024-05-31 NOTE — Progress Notes (Signed)
 Called patient's brother 3x and went straight to voicemail.  Unable to leave voicemail due to voicemail box being full.

## 2024-05-31 NOTE — Anesthesia Preprocedure Evaluation (Addendum)
 Anesthesia Evaluation  Patient identified by MRN, date of birth, ID band Patient awake    Reviewed: Allergy & Precautions, NPO status , Patient's Chart, lab work & pertinent test results  History of Anesthesia Complications (+) PROLONGED EMERGENCE and history of anesthetic complications  Airway Mallampati: II  TM Distance: >3 FB Neck ROM: Full    Dental no notable dental hx. (+) Dental Advisory Given, Teeth Intact   Pulmonary sleep apnea    Pulmonary exam normal breath sounds clear to auscultation       Cardiovascular hypertension, Pt. on medications Normal cardiovascular exam Rhythm:Regular Rate:Normal     Neuro/Psych Cervical Spondylosis Right C5-C6, and Right C6-C7  Neuromuscular disease    GI/Hepatic Neg liver ROS,GERD  ,,  Endo/Other  Obesity   Renal/GU negative Renal ROS     Musculoskeletal  (+) Arthritis ,    Abdominal  (+) + obese  Peds  Hematology negative hematology ROS (+)   Anesthesia Other Findings   Reproductive/Obstetrics                              Anesthesia Physical Anesthesia Plan  ASA: 2  Anesthesia Plan: General   Post-op Pain Management: Tylenol  PO (pre-op) and Gabapentin  PO (pre-op)*   Induction: Intravenous  PONV Risk Score and Plan: 2 and Midazolam , Dexamethasone , Ondansetron  and Treatment may vary due to age or medical condition  Airway Management Planned: Oral ETT and Video Laryngoscope Planned  Additional Equipment:   Intra-op Plan:   Post-operative Plan: Extubation in OR  Informed Consent: I have reviewed the patients History and Physical, chart, labs and discussed the procedure including the risks, benefits and alternatives for the proposed anesthesia with the patient or authorized representative who has indicated his/her understanding and acceptance.     Dental advisory given  Plan Discussed with: CRNA  Anesthesia Plan Comments:  (Risks of anesthesia explained at length. This includes, but is not limited to, sore throat, damage to teeth, lips gums, tongue and vocal cords, nausea and vomiting, reactions to medications, stroke, heart attack, and death. All patient questions were answered and the patient wishes to proceed. Pt refused nerve block)         Anesthesia Quick Evaluation

## 2024-05-31 NOTE — Anesthesia Postprocedure Evaluation (Signed)
 Anesthesia Post Note  Patient: Isam Unrein  Procedure(s) Performed: LEFT SHOULDER ARTHROSCOPY WITH EXTENSIVE DEBRIDEMENT, SUBACROMIAL DECOMPRESSION AND DISTAL CLAVICLE EXCISION (Left: Shoulder)     Patient location during evaluation: PACU Anesthesia Type: General Level of consciousness: sedated and patient cooperative Pain management: pain level controlled Vital Signs Assessment: post-procedure vital signs reviewed and stable Respiratory status: spontaneous breathing Cardiovascular status: stable Anesthetic complications: no   No notable events documented.  Last Vitals:  Vitals:   05/31/24 1515 05/31/24 1607  BP:  112/62  Pulse:  93  Resp:  20  Temp:    SpO2: 96% 94%    Last Pain:  Vitals:   05/31/24 1552  PainSc: 0-No pain                 Norleen Pope

## 2024-05-31 NOTE — Transfer of Care (Signed)
 Immediate Anesthesia Transfer of Care Note  Patient: Erik Cook  Procedure(s) Performed: LEFT SHOULDER ARTHROSCOPY WITH EXTENSIVE DEBRIDEMENT, SUBACROMIAL DECOMPRESSION AND DISTAL CLAVICLE EXCISION (Left: Shoulder)  Patient Location: PACU  Anesthesia Type:General  Level of Consciousness: drowsy  Airway & Oxygen Therapy: Patient Spontanous Breathing and Patient connected to face mask oxygen  Post-op Assessment: Report given to RN and Post -op Vital signs reviewed and stable  Post vital signs: Reviewed and stable  Last Vitals:  Vitals Value Taken Time  BP 134/84 05/31/24 15:00  Temp    Pulse 94 05/31/24 15:02  Resp 12 05/31/24 15:02  SpO2 97 % 05/31/24 15:02  Vitals shown include unfiled device data.  Last Pain:  Vitals:   05/31/24 1057  PainSc: 0-No pain      Patients Stated Pain Goal: 4 (05/31/24 1057)  Complications: No notable events documented.

## 2024-05-31 NOTE — Discharge Instructions (Addendum)
 Post-operative patient instructions  Shoulder Arthroscopy   Ice:  Place intermittent ice or cooler pack over your shoulder, 30 minutes on and 30 minutes off.  Continue this for the first 72 hours after surgery, then save ice for use after therapy sessions or on more active days.   Weight:  You may bear weight on your arm as your symptoms allow. Motion:  Perform gentle shoulder motion as tolerated Shoulder sling: You may remove the shoulder sling once the nerve block has worn off and begin gentle shoulder motion.  You must wear the shoulder sling at all times including while sleeping until otherwise instructed by your surgeon. Dressing:  Perform 1st dressing change at 2 days postoperative. A moderate amount of blood tinged drainage is to be expected.  So if you bleed through the dressing on the first or second day or if you have fevers, it is fine to change the dressing/check the wounds early and redress wound.  If it bleeds through again, or if the incisions are leaking frank blood, please call the office. May change dressing every 1-2 days thereafter to help watch wounds. You may place regular band-aids over the incisions.   Shower:  Light shower is ok after 2 days.  Please take shower, NO bath. Recover with gauze and ace wrap to help keep wounds protected.   Pain medication:  A narcotic pain medication has been prescribed.  Take as directed.  Typically you need narcotic pain medication more regularly during the first 3 to 5 days after surgery.  Decrease your use of the medication as the pain improves.  Narcotics can sometimes cause constipation, even after a few doses.  If you have problems with constipation, you can take an over the counter stool softener or light laxative.  If you have persistent problems, please notify your physician's office. Physical therapy: Additional activity guidelines to be provided by your physician or physical therapist at follow-up visits.  Driving: Do not recommend  driving x 1 week post surgical, especially if surgery performed on right side. Should not drive while taking narcotic pain medications. It typically takes at least 1 week to restore sufficient neuromuscular function for normal reaction times for driving safety.  Call [phone] for questions or problems. Evenings you will be forwarded to the hospital operator.  Ask for the orthopaedic physician on call. Please call if you experience:    Redness, foul smelling, or persistent drainage from the surgical site  worsening shoulder pain and swelling not responsive to medication  any calf pain and or swelling of the lower leg  temperatures greater than 101.5 F other questions or concerns  Per [facility] clinic policy, our goal is ensure optimal postoperative pain control with a multimodal pain management strategy. For all [facility] patients, our goal is to wean post-operative narcotic medications by 6 weeks post-operatively. If this is not possible due to utilization of pain medication prior to surgery, your [facility] doctor will support your acute post-operative pain control for the first 6 weeks postoperatively, with a plan to transition you back to your primary pain team following that. [facility] will work to ensure a therapist, occupational.  Thank you for allowing us  to be a part of your care.    Post Anesthesia Home Care Instructions  Activity: Get plenty of rest for the remainder of the day. A responsible individual must stay with you for 24 hours following the procedure.  For the next 24 hours, DO NOT: -Drive a car Environmental Consultant -Drink  alcoholic beverages -Take any medication unless instructed by your physician -Make any legal decisions or sign important papers.  Meals: Start with liquid foods such as gelatin or soup. Progress to regular foods as tolerated. Avoid greasy, spicy, heavy foods. If nausea and/or vomiting occur, drink only clear liquids until the nausea and/or vomiting subsides.  Call your physician if vomiting continues.  Special Instructions/Symptoms: Your throat may feel dry or sore from the anesthesia or the breathing tube placed in your throat during surgery. If this causes discomfort, gargle with warm salt water. The discomfort should disappear within 24 hours.  If you had a scopolamine patch placed behind your ear for the management of post- operative nausea and/or vomiting:  1. The medication in the patch is effective for 72 hours, after which it should be removed.  Wrap patch in a tissue and discard in the trash. Wash hands thoroughly with soap and water. 2. You may remove the patch earlier than 72 hours if you experience unpleasant side effects which may include dry mouth, dizziness or visual disturbances. 3. Avoid touching the patch. Wash your hands with soap and water after contact with the patch.     Next dose of tylenol  will be at 5pm

## 2024-05-31 NOTE — H&P (Signed)
 PREOPERATIVE H&P  Chief Complaint: Impingement of left shoulder  HPI: Erik Cook is a 63 y.o. male who presents for surgical treatment of Impingement of left shoulder.  He denies any changes in medical history.  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   EYE SURGERY Bilateral    Lasik   KNEE ARTHROSCOPY     left   KNEE ARTHROSCOPY     on left side again   POSTERIOR CERVICAL FUSION/FORAMINOTOMY N/A 07/22/2021   Procedure: Right C5-6 and Right C6--7 Foraminotomies;  Surgeon: Lucilla Lynwood BRAVO, MD;  Location: [facility] OR;  Service: Orthopedics;  Laterality: N/A;   ROTATOR CUFF REPAIR     left   ROTATOR CUFF REPAIR     2nd time around (after fall)   SHOULDER ARTHROSCOPY WITH SUBACROMIAL DECOMPRESSION AND BICEP TENDON REPAIR Right 03/13/2021   Procedure: SHOULDER ARTHROSCOPY WITH SUBACROMIAL DECOMPRESSION, MNIN OPEN ROTATOR CUFF TEAR REPAIR,  BICEPS TENODESIS;  Surgeon: Addie Cordella Hamilton, MD;  Location: [facility] OR;  Service: Orthopedics;  Laterality: Right;   Social History   Socioeconomic History   Marital status: Single    Spouse name: Not on file   Number of children: Not on file   Years of education: Not on file   Highest education level: Not on file  Occupational History   Not on file  Tobacco Use   Smoking status: Never   Smokeless tobacco: Never  Vaping Use   Vaping status: Never Used  Substance and Sexual Activity   Alcohol use: No    Comment: occasional beer - twice month   Drug use: No   Sexual activity: Not on file  Other Topics Concern   Not on file  Social History Narrative   Not on file   Social Drivers of Health   Financial Resource Strain: Not on file  Food Insecurity: No Food Insecurity  (05/25/2022)   Received from [facility]   Hunger Vital Sign    Within the past 12 months, you worried that your food would run out before you got the money to buy more.: Never true    Within the past 12 months, the food you bought just didn't last and you didn't have money to get more.: Never true  Transportation Needs: Not on file  Physical Activity: Not on file  Stress: Not on file  Social Connections: Unknown (12/04/2021)   [facility]   Social Network    Social Network: Not on file   History reviewed. No pertinent family history. Allergies  Allergen Reactions   Oxycodone      Hard time staying awake and couldn't get out bed Anxiety and hallucinations   Prior to Admission medications   Medication Sig Start Date End Date Taking? Authorizing Provider  cholecalciferol  (VITAMIN D3) 25 MCG (1000 UNIT) tablet Take 1,000 Units by mouth daily.   Yes [provider]  HYDROcodone -acetaminophen  (NORCO/VICODIN) 5-325 MG tablet Take 1 tablet by mouth 3 (three) times daily as needed. To be taken after surgery 05/26/24   Jule Ronal CROME, PA-C  lisinopril -hydrochlorothiazide  (ZESTORETIC ) 20-25 MG tablet Take 1 tablet by mouth at bedtime. 02/04/21  Yes [provider]  meloxicam  (MOBIC ) 15 MG tablet Take 1 tablet by mouth once daily 11/14/21  Yes Nitka, James  E, MD  Multiple Vitamins-Minerals (MULTIVITAMIN WITH MINERALS) tablet Take 1 tablet by mouth daily.   Yes [provider]  NON FORMULARY [place] Injection   Yes [provider]  Omega-3 1000 MG CAPS Take 1,000 mg by mouth daily.   Yes [provider]  ondansetron  (ZOFRAN ) 4 MG tablet Take 1 tablet (4 mg total) by mouth every 8 (eight) hours as needed for nausea or vomiting. 05/26/24   Jule Ronal CROME, PA-C  pregabalin  (LYRICA ) 75 MG capsule Take 75 mg by mouth in the morning and at bedtime. 10/16/20  Yes [provider]  baclofen  (LIORESAL ) 10 MG tablet Take 1 tablet  (10 mg total) by mouth 3 (three) times daily. 04/28/22   Nitka, James E, MD  baclofen  (LIORESAL ) 10 MG tablet Take 1 tablet (10 mg total) by mouth 3 (three) times daily. 02/16/22   Nitka, James E, MD  diclofenac  Sodium (VOLTAREN ) 1 % GEL Apply 2 g topically 4 (four) times daily. 10/29/21   Nitka, James E, MD  Diclofenac  Sodium 3 % GEL Apply 4 g topically 3 (three) times daily as needed (Right dorsal lateral proximal forearm). 09/29/21   Nitka, James E, MD  HYDROcodone -acetaminophen  (NORCO) 7.5-325 MG tablet Take 1 tablet by mouth every 6 (six) hours as needed for moderate pain. 04/15/22   Nitka, James E, MD  testosterone cypionate (DEPOTESTOSTERONE CYPIONATE) 200 MG/ML injection Inject 1 ml Intramuscular Every 2 weeks for 84 days 07/21/22   [provider]     Positive ROS: All other systems have been reviewed and were otherwise negative with the exception of those mentioned in the HPI and as above.  Physical Exam: General: Alert, no acute distress Cardiovascular: No pedal edema Respiratory: No cyanosis, no use of accessory musculature GI: abdomen soft Skin: No lesions in the area of chief complaint Neurologic: Sensation intact distally Psychiatric: Patient is competent for consent with normal mood and affect Lymphatic: no lymphedema  MUSCULOSKELETAL: exam stable  Assessment: Impingement of left shoulder  Plan: Plan for Procedure(s): ARTHROSCOPY, SHOULDER WITH DEBRIDEMENT  The risks benefits and alternatives were discussed with the patient including but not limited to the risks of nonoperative treatment, versus surgical intervention including infection, bleeding, nerve injury,  blood clots, cardiopulmonary complications, morbidity, mortality, among others, and they were willing to proceed.   Ozell Cummins, MD 05/31/2024 11:04 AM

## 2024-05-31 NOTE — Anesthesia Procedure Notes (Signed)
 Procedure Name: Intubation Date/Time: 05/31/2024 1:44 PM  Performed by: Debarah Chiquita LABOR, CRNAPre-anesthesia Checklist: Patient identified, Emergency Drugs available, Suction available and Patient being monitored Patient Re-evaluated:Patient Re-evaluated prior to induction Oxygen Delivery Method: Circle system utilized Preoxygenation: Pre-oxygenation with 100% oxygen Induction Type: IV induction Ventilation: Mask ventilation without difficulty and Oral airway inserted - appropriate to patient size Laryngoscope Size: Glidescope and 4 Grade View: Grade I Tube type: Oral Tube size: 7.5 mm Number of attempts: 1 Airway Equipment and Method: Oral airway, Rigid stylet and Bite block Placement Confirmation: ETT inserted through vocal cords under direct vision, positive ETCO2 and breath sounds checked- equal and bilateral Secured at: 23 cm Tube secured with: Tape Dental Injury: Teeth and Oropharynx as per pre-operative assessment

## 2024-06-01 ENCOUNTER — Encounter (HOSPITAL_BASED_OUTPATIENT_CLINIC_OR_DEPARTMENT_OTHER): Payer: Self-pay | Admitting: Orthopaedic Surgery

## 2024-06-05 ENCOUNTER — Encounter: Payer: Self-pay | Admitting: Radiology

## 2024-06-07 ENCOUNTER — Ambulatory Visit (INDEPENDENT_AMBULATORY_CARE_PROVIDER_SITE_OTHER): Admitting: Physician Assistant

## 2024-06-07 DIAGNOSIS — Z9889 Other specified postprocedural states: Secondary | ICD-10-CM

## 2024-06-07 NOTE — Progress Notes (Signed)
 Post-Op Visit Note   Patient: Erik Cook           Date of Birth: March 01, 1961           MRN: [id number] Visit Date: 06/07/2024 PCP: Jarold Omar RAMAN, MD   Assessment & Plan:  Chief Complaint:  Chief Complaint  Patient presents with   Left Shoulder - Routine Post Op   Visit Diagnoses:  1. S/P arthroscopy of left shoulder     Plan: Patient is a pleasant 63 year old gentleman who comes in today 1 week status post left shoulder arthroscopic debridement, decompression and debridement of irreparable rotator cuff.  He has been doing great.  He has had virtually no pain.  Examination of the left shoulder reveals well healing surgical portals with nylon sutures in place.  No evidence of infection or cellulitis.  Fingers are warm well-perfused.  He has neurovascular tact distally.  Today, sutures were removed and Steri-Strips applied.  We have discussed putting in a referral for outpatient PT, but he notes due to financial constraints he will not be able to attend.  I have provided him with a rotator cuff home exercise program.  He will follow-up with us  in 5 weeks for recheck.  Call with concerns or questions.  Follow-Up Instructions: Return in about 5 weeks (around 07/12/2024).   Orders:  No orders of the defined types were placed in this encounter.  No orders of the defined types were placed in this encounter.   Imaging: No new imaging  PMFS History: Patient Active Problem List   Diagnosis Date Noted   Arthritis of left acromioclavicular joint 05/31/2024   Impingement syndrome of left shoulder 05/31/2024   Hypertension 05/31/2024   Nontraumatic incomplete tear of left rotator cuff 05/31/2024   Other spondylosis with radiculopathy, cervical region 07/22/2021   Complete tear of right rotator cuff    Biceps tendonitis on right    Degenerative superior labral anterior-to-posterior (SLAP) tear of right shoulder    Lumbar radiculopathy 09/28/2016   Past Medical History:   Diagnosis Date   Arthritis    knees, lower back   Complication of anesthesia    Hard time waking up   GERD (gastroesophageal reflux disease)    no current problem - no meds   HLD (hyperlipidemia)    no meds - diet controlled   Hypertension    Sleep apnea    does not use cpap    No family history on file.  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   EYE SURGERY Bilateral    Lasik   KNEE ARTHROSCOPY     left   KNEE ARTHROSCOPY     on left side again   POSTERIOR CERVICAL FUSION/FORAMINOTOMY N/A 07/22/2021   Procedure: Right C5-6 and Right C6--7 Foraminotomies;  Surgeon: Lucilla Lynwood BRAVO, MD;  Location: [facility] OR;  Service: Orthopedics;  Laterality: N/A;   POSTERIOR LUMBAR FUSION 2 WITH HARDWARE REMOVAL Left 05/31/2024   Procedure: LEFT SHOULDER ARTHROSCOPY WITH EXTENSIVE DEBRIDEMENT, SUBACROMIAL DECOMPRESSION AND DISTAL CLAVICLE EXCISION;  Surgeon: Jerri Kay HERO, MD;  Location: [facility];  Service: Orthopedics;  Laterality: Left;  left shoulder arthroscopy, extensive debridement, subacromial decompression, distal clavicle excison   ROTATOR CUFF REPAIR     left   ROTATOR  CUFF REPAIR     2nd time around (after fall)   SHOULDER ARTHROSCOPY WITH SUBACROMIAL DECOMPRESSION AND BICEP TENDON REPAIR Right 03/13/2021   Procedure: SHOULDER ARTHROSCOPY WITH SUBACROMIAL DECOMPRESSION, MNIN OPEN ROTATOR CUFF TEAR REPAIR,  BICEPS TENODESIS;  Surgeon: Addie Cordella Hamilton, MD;  Location: [facility] OR;  Service: Orthopedics;  Laterality: Right;   Social History   Occupational History   Not on file  Tobacco Use   Smoking status: Never   Smokeless tobacco: Never  Vaping Use   Vaping status: Never Used  Substance and Sexual  Activity   Alcohol use: No    Comment: occasional beer - twice month   Drug use: No   Sexual activity: Not on file

## 2024-07-12 ENCOUNTER — Encounter: Admitting: Physician Assistant

## 2024-08-08 ENCOUNTER — Ambulatory Visit (INDEPENDENT_AMBULATORY_CARE_PROVIDER_SITE_OTHER): Admitting: Physician Assistant

## 2024-08-08 ENCOUNTER — Encounter (HOSPITAL_BASED_OUTPATIENT_CLINIC_OR_DEPARTMENT_OTHER): Payer: Self-pay | Admitting: Orthopaedic Surgery

## 2024-08-08 DIAGNOSIS — Z9889 Other specified postprocedural states: Secondary | ICD-10-CM

## 2024-08-08 NOTE — Progress Notes (Signed)
 "  Post-Op Visit Note   Patient: Erik Cook           Date of Birth: 08-26-1960           MRN: [id number] Visit Date: 08/08/2024 PCP: Jarold Omar RAMAN, MD   Assessment & Plan:  Chief Complaint:  Chief Complaint  Patient presents with   Left Shoulder - Routine Post Op   Visit Diagnoses:  1. S/P arthroscopy of left shoulder     Plan: Patient is a pleasant 64 year old gentleman who comes in today approximately 10 weeks status post left shoulder debridement reparable rotator cuff.  He has been doing better.  He has been rehabbing this on his own and has regained near full range of motion with the exception of internal rotation.  His pain has significantly improved.  Examination of left shoulder reveals near full active range of motion in all planes.  He can internally rotate to L5.  He is neurovascularly intact distally.  At this point, he will continue to work on strengthening exercises.  Follow-up as needed.  Follow-Up Instructions: Return if symptoms worsen or fail to improve.   Orders:  No orders of the defined types were placed in this encounter.  No orders of the defined types were placed in this encounter.   Imaging: No new imaging  PMFS History: Patient Active Problem List   Diagnosis Date Noted   Arthritis of left acromioclavicular joint 05/31/2024   Impingement syndrome of left shoulder 05/31/2024   Hypertension 05/31/2024   Nontraumatic incomplete tear of left rotator cuff 05/31/2024   Other spondylosis with radiculopathy, cervical region 07/22/2021   Complete tear of right rotator cuff    Biceps tendonitis on right    Degenerative superior labral anterior-to-posterior (SLAP) tear of right shoulder    Lumbar radiculopathy 09/28/2016   Past Medical History:  Diagnosis Date   Arthritis    knees, lower back   Complication of anesthesia    Hard time waking up   GERD (gastroesophageal reflux disease)    no current problem - no meds   HLD (hyperlipidemia)     no meds - diet controlled   Hypertension    Sleep apnea    does not use cpap    No family history on file.  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   EYE SURGERY Bilateral    Lasik   KNEE ARTHROSCOPY     left   KNEE ARTHROSCOPY     on left side again   POSTERIOR CERVICAL FUSION/FORAMINOTOMY N/A 07/22/2021   Procedure: Right C5-6 and Right C6--7 Foraminotomies;  Surgeon: Lucilla Lynwood BRAVO, MD;  Location: [facility] OR;  Service: Orthopedics;  Laterality: N/A;   POSTERIOR LUMBAR FUSION 2 WITH HARDWARE REMOVAL Left 05/31/2024   Procedure: LEFT SHOULDER ARTHROSCOPY WITH EXTENSIVE DEBRIDEMENT, SUBACROMIAL DECOMPRESSION AND DISTAL CLAVICLE EXCISION;  Surgeon: Jerri Kay HERO, MD;  Location: [facility];  Service: Orthopedics;  Laterality: Left;  left shoulder arthroscopy, extensive debridement, subacromial decompression, distal clavicle excison   ROTATOR CUFF REPAIR     left   ROTATOR CUFF REPAIR     2nd time around (after fall)   SHOULDER ARTHROSCOPY WITH SUBACROMIAL DECOMPRESSION AND BICEP TENDON REPAIR Right 03/13/2021   Procedure: SHOULDER ARTHROSCOPY WITH SUBACROMIAL DECOMPRESSION,  MNIN OPEN ROTATOR CUFF TEAR REPAIR,  BICEPS TENODESIS;  Surgeon: Addie Cordella Hamilton, MD;  Location: [facility] OR;  Service: Orthopedics;  Laterality: Right;   Social History   Occupational History   Not on file  Tobacco Use   Smoking status: Never   Smokeless tobacco: Never  Vaping Use   Vaping status: Never Used  Substance and Sexual Activity   Alcohol use: No    Comment: occasional beer - twice month   Drug use: No   Sexual activity: Not on file     "
